# Patient Record
Sex: Female | Born: 1945 | Race: White | Hispanic: No | Smoking: Current every day smoker
Health system: Southern US, Community
[De-identification: ages and names within clinical notes are randomized; demographics above are authoritative.]

## PROBLEM LIST (undated history)

## (undated) VITALS — BP 111/62 | HR 87 | Temp 98.6°F | Resp 19 | Ht 64.0 in | Wt 132.8 lb

## (undated) DIAGNOSIS — J449 Chronic obstructive pulmonary disease, unspecified: Secondary | ICD-10-CM

## (undated) DIAGNOSIS — C50919 Malignant neoplasm of unspecified site of unspecified female breast: Secondary | ICD-10-CM

## (undated) DIAGNOSIS — C7800 Secondary malignant neoplasm of unspecified lung: Secondary | ICD-10-CM

## (undated) DIAGNOSIS — A4902 Methicillin resistant Staphylococcus aureus infection, unspecified site: Secondary | ICD-10-CM

## (undated) DIAGNOSIS — J45909 Unspecified asthma, uncomplicated: Secondary | ICD-10-CM

## (undated) DIAGNOSIS — I10 Essential (primary) hypertension: Secondary | ICD-10-CM

## (undated) HISTORY — DX: Secondary malignant neoplasm of unspecified lung: C78.00

## (undated) HISTORY — DX: Malignant neoplasm of unspecified site of unspecified female breast: C50.919

## (undated) HISTORY — PX: CHOLECYSTECTOMY: SHX55

## (undated) HISTORY — PX: MASTECTOMY: SHX3

---

## 1999-05-22 ENCOUNTER — Emergency Department (HOSPITAL_COMMUNITY): Admission: EM | Admit: 1999-05-22 | Discharge: 1999-05-22 | Payer: Self-pay | Admitting: Emergency Medicine

## 1999-05-30 ENCOUNTER — Emergency Department (HOSPITAL_COMMUNITY): Admission: EM | Admit: 1999-05-30 | Discharge: 1999-05-30 | Payer: Self-pay | Admitting: Emergency Medicine

## 2003-02-01 ENCOUNTER — Emergency Department (HOSPITAL_COMMUNITY): Admission: EM | Admit: 2003-02-01 | Discharge: 2003-02-01 | Payer: Self-pay | Admitting: Emergency Medicine

## 2003-03-26 ENCOUNTER — Emergency Department (HOSPITAL_COMMUNITY): Admission: EM | Admit: 2003-03-26 | Discharge: 2003-03-26 | Payer: Self-pay | Admitting: Emergency Medicine

## 2003-04-08 ENCOUNTER — Other Ambulatory Visit: Payer: Self-pay

## 2004-02-11 ENCOUNTER — Emergency Department: Payer: Self-pay | Admitting: Emergency Medicine

## 2004-07-05 ENCOUNTER — Emergency Department: Payer: Self-pay | Admitting: Emergency Medicine

## 2005-02-27 ENCOUNTER — Emergency Department: Payer: Self-pay | Admitting: Emergency Medicine

## 2005-05-27 ENCOUNTER — Emergency Department: Payer: Self-pay | Admitting: Emergency Medicine

## 2005-07-13 ENCOUNTER — Other Ambulatory Visit: Payer: Self-pay

## 2005-07-13 ENCOUNTER — Emergency Department: Payer: Self-pay | Admitting: Emergency Medicine

## 2005-10-31 ENCOUNTER — Emergency Department: Payer: Self-pay | Admitting: Emergency Medicine

## 2005-11-02 ENCOUNTER — Emergency Department: Payer: Self-pay | Admitting: Emergency Medicine

## 2005-12-02 ENCOUNTER — Inpatient Hospital Stay: Payer: Self-pay | Admitting: Internal Medicine

## 2006-01-28 ENCOUNTER — Emergency Department: Payer: Self-pay | Admitting: Emergency Medicine

## 2006-02-11 ENCOUNTER — Emergency Department: Payer: Self-pay | Admitting: Emergency Medicine

## 2006-06-27 ENCOUNTER — Emergency Department: Payer: Self-pay | Admitting: Emergency Medicine

## 2006-07-11 ENCOUNTER — Emergency Department: Payer: Self-pay | Admitting: Emergency Medicine

## 2006-08-08 ENCOUNTER — Ambulatory Visit: Payer: Self-pay | Admitting: Internal Medicine

## 2006-08-19 ENCOUNTER — Emergency Department: Payer: Self-pay

## 2006-08-19 ENCOUNTER — Other Ambulatory Visit: Payer: Self-pay

## 2006-08-28 ENCOUNTER — Ambulatory Visit: Payer: Self-pay | Admitting: Internal Medicine

## 2006-09-01 ENCOUNTER — Ambulatory Visit: Payer: Self-pay | Admitting: Vascular Surgery

## 2006-09-02 ENCOUNTER — Emergency Department: Payer: Self-pay | Admitting: Emergency Medicine

## 2006-09-07 ENCOUNTER — Ambulatory Visit: Payer: Self-pay | Admitting: Internal Medicine

## 2006-09-09 ENCOUNTER — Inpatient Hospital Stay: Payer: Self-pay | Admitting: Internal Medicine

## 2006-10-05 ENCOUNTER — Emergency Department: Payer: Self-pay | Admitting: Emergency Medicine

## 2006-10-08 ENCOUNTER — Ambulatory Visit: Payer: Self-pay | Admitting: Internal Medicine

## 2006-10-25 ENCOUNTER — Inpatient Hospital Stay: Payer: Self-pay | Admitting: General Practice

## 2006-11-07 ENCOUNTER — Ambulatory Visit: Payer: Self-pay | Admitting: Internal Medicine

## 2006-11-13 ENCOUNTER — Ambulatory Visit: Payer: Self-pay | Admitting: Internal Medicine

## 2006-11-21 ENCOUNTER — Inpatient Hospital Stay: Payer: Self-pay | Admitting: Internal Medicine

## 2006-12-08 ENCOUNTER — Ambulatory Visit: Payer: Self-pay | Admitting: Internal Medicine

## 2006-12-16 ENCOUNTER — Other Ambulatory Visit: Payer: Self-pay

## 2006-12-16 ENCOUNTER — Emergency Department: Payer: Self-pay | Admitting: Emergency Medicine

## 2007-01-08 ENCOUNTER — Ambulatory Visit: Payer: Self-pay | Admitting: Internal Medicine

## 2007-01-08 ENCOUNTER — Inpatient Hospital Stay: Payer: Self-pay | Admitting: Internal Medicine

## 2007-01-08 ENCOUNTER — Other Ambulatory Visit: Payer: Self-pay

## 2007-01-11 ENCOUNTER — Other Ambulatory Visit: Payer: Self-pay

## 2007-01-18 ENCOUNTER — Ambulatory Visit: Payer: Self-pay | Admitting: Internal Medicine

## 2007-02-01 ENCOUNTER — Ambulatory Visit: Payer: Self-pay | Admitting: Internal Medicine

## 2007-02-07 ENCOUNTER — Ambulatory Visit: Payer: Self-pay | Admitting: Internal Medicine

## 2007-02-19 ENCOUNTER — Emergency Department: Payer: Self-pay

## 2007-03-10 ENCOUNTER — Ambulatory Visit: Payer: Self-pay | Admitting: Internal Medicine

## 2007-04-09 ENCOUNTER — Ambulatory Visit: Payer: Self-pay | Admitting: Internal Medicine

## 2007-05-10 ENCOUNTER — Ambulatory Visit: Payer: Self-pay | Admitting: Internal Medicine

## 2007-06-06 ENCOUNTER — Encounter: Payer: Self-pay | Admitting: Internal Medicine

## 2007-06-10 ENCOUNTER — Ambulatory Visit: Payer: Self-pay | Admitting: Internal Medicine

## 2007-07-08 ENCOUNTER — Ambulatory Visit: Payer: Self-pay | Admitting: Internal Medicine

## 2007-08-08 ENCOUNTER — Ambulatory Visit: Payer: Self-pay | Admitting: Internal Medicine

## 2007-08-19 ENCOUNTER — Emergency Department: Payer: Self-pay | Admitting: Emergency Medicine

## 2007-08-23 ENCOUNTER — Ambulatory Visit: Payer: Self-pay | Admitting: Cardiovascular Disease

## 2007-08-23 ENCOUNTER — Other Ambulatory Visit: Payer: Self-pay

## 2007-09-07 ENCOUNTER — Ambulatory Visit: Payer: Self-pay | Admitting: Internal Medicine

## 2007-10-08 ENCOUNTER — Ambulatory Visit: Payer: Self-pay | Admitting: Internal Medicine

## 2007-11-07 ENCOUNTER — Ambulatory Visit: Payer: Self-pay | Admitting: Internal Medicine

## 2007-12-08 ENCOUNTER — Ambulatory Visit: Payer: Self-pay | Admitting: Internal Medicine

## 2008-01-08 ENCOUNTER — Ambulatory Visit: Payer: Self-pay | Admitting: Internal Medicine

## 2008-01-29 ENCOUNTER — Ambulatory Visit: Payer: Self-pay | Admitting: Vascular Surgery

## 2008-02-07 ENCOUNTER — Ambulatory Visit: Payer: Self-pay | Admitting: Internal Medicine

## 2008-04-07 ENCOUNTER — Encounter: Payer: Self-pay | Admitting: Internal Medicine

## 2008-04-07 ENCOUNTER — Ambulatory Visit: Payer: Self-pay | Admitting: Internal Medicine

## 2008-04-08 ENCOUNTER — Encounter: Payer: Self-pay | Admitting: Internal Medicine

## 2008-04-14 ENCOUNTER — Ambulatory Visit: Payer: Self-pay | Admitting: Podiatry

## 2008-05-09 ENCOUNTER — Ambulatory Visit: Payer: Self-pay | Admitting: Internal Medicine

## 2008-05-09 ENCOUNTER — Encounter: Payer: Self-pay | Admitting: Internal Medicine

## 2008-06-06 ENCOUNTER — Ambulatory Visit: Payer: Self-pay | Admitting: Internal Medicine

## 2008-06-09 ENCOUNTER — Ambulatory Visit: Payer: Self-pay | Admitting: Internal Medicine

## 2008-08-04 ENCOUNTER — Ambulatory Visit: Payer: Self-pay | Admitting: Cardiovascular Disease

## 2008-08-19 ENCOUNTER — Ambulatory Visit: Payer: Self-pay | Admitting: Cardiovascular Disease

## 2008-09-17 ENCOUNTER — Inpatient Hospital Stay: Payer: Self-pay | Admitting: Cardiovascular Disease

## 2008-10-13 ENCOUNTER — Other Ambulatory Visit: Payer: Self-pay | Admitting: Cardiovascular Disease

## 2008-10-14 ENCOUNTER — Ambulatory Visit: Payer: Self-pay | Admitting: Cardiovascular Disease

## 2008-11-06 ENCOUNTER — Ambulatory Visit: Payer: Self-pay | Admitting: Internal Medicine

## 2008-11-17 ENCOUNTER — Ambulatory Visit: Payer: Self-pay | Admitting: Internal Medicine

## 2008-12-07 ENCOUNTER — Ambulatory Visit: Payer: Self-pay | Admitting: Internal Medicine

## 2009-06-09 ENCOUNTER — Ambulatory Visit: Payer: Self-pay | Admitting: Internal Medicine

## 2009-06-23 ENCOUNTER — Ambulatory Visit: Payer: Self-pay | Admitting: Internal Medicine

## 2009-07-07 ENCOUNTER — Ambulatory Visit: Payer: Self-pay | Admitting: Internal Medicine

## 2009-10-13 ENCOUNTER — Ambulatory Visit: Payer: Self-pay | Admitting: Internal Medicine

## 2009-11-30 ENCOUNTER — Emergency Department: Payer: Self-pay | Admitting: Emergency Medicine

## 2009-12-27 ENCOUNTER — Emergency Department: Payer: Self-pay | Admitting: Internal Medicine

## 2010-02-22 ENCOUNTER — Emergency Department: Payer: Self-pay | Admitting: Internal Medicine

## 2010-03-10 ENCOUNTER — Emergency Department: Payer: Self-pay | Admitting: Unknown Physician Specialty

## 2010-06-01 ENCOUNTER — Inpatient Hospital Stay: Payer: Self-pay | Admitting: Internal Medicine

## 2010-06-18 ENCOUNTER — Ambulatory Visit: Payer: Self-pay | Admitting: Internal Medicine

## 2010-06-19 LAB — CANCER ANTIGEN 27.29: CA 27.29: 38.8 U/mL — ABNORMAL HIGH (ref 0.0–38.6)

## 2010-06-27 ENCOUNTER — Inpatient Hospital Stay: Payer: Self-pay | Admitting: Internal Medicine

## 2010-07-08 ENCOUNTER — Ambulatory Visit: Payer: Self-pay | Admitting: Internal Medicine

## 2010-09-17 ENCOUNTER — Ambulatory Visit: Payer: Self-pay | Admitting: Internal Medicine

## 2010-09-18 LAB — CANCER ANTIGEN 27.29: CA 27.29: 33.1 U/mL (ref 0.0–38.6)

## 2010-10-08 ENCOUNTER — Ambulatory Visit: Payer: Self-pay | Admitting: Internal Medicine

## 2010-10-26 ENCOUNTER — Encounter: Payer: Self-pay | Admitting: Nurse Practitioner

## 2010-11-20 ENCOUNTER — Emergency Department: Payer: Self-pay | Admitting: Emergency Medicine

## 2010-12-05 ENCOUNTER — Emergency Department: Payer: Self-pay | Admitting: Emergency Medicine

## 2011-01-16 ENCOUNTER — Emergency Department: Payer: Self-pay | Admitting: Emergency Medicine

## 2011-01-29 ENCOUNTER — Emergency Department: Payer: Self-pay | Admitting: Internal Medicine

## 2011-02-06 ENCOUNTER — Emergency Department: Payer: Self-pay | Admitting: *Deleted

## 2011-02-09 ENCOUNTER — Ambulatory Visit: Payer: Self-pay | Admitting: Internal Medicine

## 2011-02-14 ENCOUNTER — Ambulatory Visit: Payer: Self-pay | Admitting: Internal Medicine

## 2011-02-15 LAB — CANCER ANTIGEN 27.29: CA 27.29: 35.5 U/mL (ref 0.0–38.6)

## 2011-02-23 ENCOUNTER — Ambulatory Visit: Payer: Self-pay | Admitting: Internal Medicine

## 2011-03-10 ENCOUNTER — Ambulatory Visit: Payer: Self-pay | Admitting: Internal Medicine

## 2011-03-12 ENCOUNTER — Emergency Department: Payer: Self-pay | Admitting: Internal Medicine

## 2011-03-13 ENCOUNTER — Emergency Department: Payer: Self-pay | Admitting: Emergency Medicine

## 2011-03-15 ENCOUNTER — Ambulatory Visit: Payer: Self-pay | Admitting: Internal Medicine

## 2011-04-19 ENCOUNTER — Emergency Department: Payer: Self-pay | Admitting: Emergency Medicine

## 2011-05-20 ENCOUNTER — Inpatient Hospital Stay: Payer: Self-pay | Admitting: Internal Medicine

## 2011-05-20 LAB — CBC WITH DIFFERENTIAL/PLATELET
Basophil #: 0 10*3/uL (ref 0.0–0.1)
Basophil %: 1 %
Eosinophil #: 0.2 10*3/uL (ref 0.0–0.7)
Eosinophil %: 3.4 %
HCT: 29.5 % — ABNORMAL LOW (ref 35.0–47.0)
HGB: 9.4 g/dL — ABNORMAL LOW (ref 12.0–16.0)
Lymphocyte #: 1.9 10*3/uL (ref 1.0–3.6)
Lymphocyte %: 39.9 %
MCH: 31.8 pg (ref 26.0–34.0)
MCHC: 31.9 g/dL — ABNORMAL LOW (ref 32.0–36.0)
MCV: 100 fL (ref 80–100)
Monocyte #: 0.4 10*3/uL (ref 0.0–0.7)
Monocyte %: 7.6 %
Neutrophil #: 2.3 10*3/uL (ref 1.4–6.5)
Neutrophil %: 48.1 %
Platelet: 188 10*3/uL (ref 150–440)
RBC: 2.96 10*6/uL — ABNORMAL LOW (ref 3.80–5.20)
RDW: 16.5 % — ABNORMAL HIGH (ref 11.5–14.5)
WBC: 4.8 10*3/uL (ref 3.6–11.0)

## 2011-05-20 LAB — PROTIME-INR
INR: 1
Prothrombin Time: 13.2 secs (ref 11.5–14.7)

## 2011-05-20 LAB — BASIC METABOLIC PANEL
Anion Gap: 6 — ABNORMAL LOW (ref 7–16)
BUN: 12 mg/dL (ref 7–18)
Calcium, Total: 8.3 mg/dL — ABNORMAL LOW (ref 8.5–10.1)
Chloride: 108 mmol/L — ABNORMAL HIGH (ref 98–107)
Co2: 32 mmol/L (ref 21–32)
Creatinine: 0.54 mg/dL — ABNORMAL LOW (ref 0.60–1.30)
EGFR (African American): >60
EGFR (Non-African Amer.): >60
Glucose: 74 mg/dL (ref 65–99)
Osmolality: 289 (ref 275–301)
Potassium: 4 mmol/L (ref 3.5–5.1)
Sodium: 146 mmol/L — ABNORMAL HIGH (ref 136–145)

## 2011-05-21 LAB — CBC WITH DIFFERENTIAL/PLATELET
Basophil #: 0 10*3/uL (ref 0.0–0.1)
Basophil %: 0.5 %
Eosinophil #: 0.1 10*3/uL (ref 0.0–0.7)
Eosinophil %: 2.3 %
HCT: 29 % — ABNORMAL LOW (ref 35.0–47.0)
HGB: 9.2 g/dL — ABNORMAL LOW (ref 12.0–16.0)
Lymphocyte #: 1.8 10*3/uL (ref 1.0–3.6)
Lymphocyte %: 34.5 %
MCH: 31.6 pg (ref 26.0–34.0)
MCHC: 31.9 g/dL — ABNORMAL LOW (ref 32.0–36.0)
MCV: 99 fL (ref 80–100)
Monocyte #: 0.3 10*3/uL (ref 0.0–0.7)
Monocyte %: 6.4 %
Neutrophil #: 2.9 10*3/uL (ref 1.4–6.5)
Neutrophil %: 56.3 %
Platelet: 177 10*3/uL (ref 150–440)
RBC: 2.93 10*6/uL — ABNORMAL LOW (ref 3.80–5.20)
RDW: 16.5 % — ABNORMAL HIGH (ref 11.5–14.5)
WBC: 5.1 10*3/uL (ref 3.6–11.0)

## 2011-05-21 LAB — BASIC METABOLIC PANEL
Anion Gap: 8 (ref 7–16)
BUN: 11 mg/dL (ref 7–18)
Calcium, Total: 8.2 mg/dL — ABNORMAL LOW (ref 8.5–10.1)
Chloride: 106 mmol/L (ref 98–107)
Co2: 31 mmol/L (ref 21–32)
Creatinine: 0.66 mg/dL (ref 0.60–1.30)
EGFR (African American): >60
EGFR (Non-African Amer.): >60
Glucose: 101 mg/dL — ABNORMAL HIGH (ref 65–99)
Osmolality: 288 (ref 275–301)
Potassium: 3.6 mmol/L (ref 3.5–5.1)
Sodium: 145 mmol/L (ref 136–145)

## 2011-05-22 LAB — BASIC METABOLIC PANEL
Anion Gap: 9 (ref 7–16)
BUN: 15 mg/dL (ref 7–18)
Calcium, Total: 8.2 mg/dL — ABNORMAL LOW (ref 8.5–10.1)
Chloride: 103 mmol/L (ref 98–107)
Co2: 32 mmol/L (ref 21–32)
Creatinine: 0.6 mg/dL (ref 0.60–1.30)
EGFR (African American): >60
EGFR (Non-African Amer.): >60
Glucose: 92 mg/dL (ref 65–99)
Osmolality: 287 (ref 275–301)
Potassium: 3.4 mmol/L — ABNORMAL LOW (ref 3.5–5.1)
Sodium: 144 mmol/L (ref 136–145)

## 2011-05-22 LAB — CBC WITH DIFFERENTIAL/PLATELET
Basophil #: 0 10*3/uL (ref 0.0–0.1)
Basophil %: 0.5 %
Eosinophil #: 0.1 10*3/uL (ref 0.0–0.7)
Eosinophil %: 2 %
HCT: 26.6 % — ABNORMAL LOW (ref 35.0–47.0)
HGB: 8.5 g/dL — ABNORMAL LOW (ref 12.0–16.0)
Lymphocyte #: 1.9 10*3/uL (ref 1.0–3.6)
Lymphocyte %: 39.6 %
MCH: 31.6 pg (ref 26.0–34.0)
MCHC: 32.1 g/dL (ref 32.0–36.0)
MCV: 99 fL (ref 80–100)
Monocyte #: 0.5 10*3/uL (ref 0.0–0.7)
Monocyte %: 9.5 %
Neutrophil #: 2.3 10*3/uL (ref 1.4–6.5)
Neutrophil %: 48.4 %
Platelet: 167 10*3/uL (ref 150–440)
RBC: 2.7 10*6/uL — ABNORMAL LOW (ref 3.80–5.20)
RDW: 16.8 % — ABNORMAL HIGH (ref 11.5–14.5)
WBC: 4.8 10*3/uL (ref 3.6–11.0)

## 2011-05-22 LAB — MAGNESIUM: Magnesium: 1.7 mg/dL — ABNORMAL LOW

## 2011-05-23 LAB — BASIC METABOLIC PANEL
Anion Gap: 7 (ref 7–16)
BUN: 16 mg/dL (ref 7–18)
Calcium, Total: 8.7 mg/dL (ref 8.5–10.1)
Chloride: 101 mmol/L (ref 98–107)
Co2: 32 mmol/L (ref 21–32)
Creatinine: 0.71 mg/dL (ref 0.60–1.30)
EGFR (African American): >60
EGFR (Non-African Amer.): >60
Glucose: 116 mg/dL — ABNORMAL HIGH (ref 65–99)
Osmolality: 282 (ref 275–301)
Potassium: 3.4 mmol/L — ABNORMAL LOW (ref 3.5–5.1)
Sodium: 140 mmol/L (ref 136–145)

## 2011-05-23 LAB — CBC WITH DIFFERENTIAL/PLATELET
Basophil #: 0 10*3/uL (ref 0.0–0.1)
Basophil %: 0.9 %
Eosinophil #: 0.2 10*3/uL (ref 0.0–0.7)
Eosinophil %: 3.9 %
HCT: 27.4 % — ABNORMAL LOW (ref 35.0–47.0)
HGB: 8.5 g/dL — ABNORMAL LOW (ref 12.0–16.0)
Lymphocyte #: 1.9 10*3/uL (ref 1.0–3.6)
Lymphocyte %: 37.8 %
MCH: 30.9 pg (ref 26.0–34.0)
MCHC: 30.9 g/dL — ABNORMAL LOW (ref 32.0–36.0)
MCV: 100 fL (ref 80–100)
Monocyte #: 0.5 10*3/uL (ref 0.0–0.7)
Monocyte %: 9 %
Neutrophil #: 2.4 10*3/uL (ref 1.4–6.5)
Neutrophil %: 48.4 %
Platelet: 157 10*3/uL (ref 150–440)
RBC: 2.74 10*6/uL — ABNORMAL LOW (ref 3.80–5.20)
RDW: 16.1 % — ABNORMAL HIGH (ref 11.5–14.5)
WBC: 5.1 10*3/uL (ref 3.6–11.0)

## 2011-06-17 ENCOUNTER — Emergency Department: Payer: Self-pay | Admitting: Internal Medicine

## 2011-08-04 ENCOUNTER — Encounter: Payer: Self-pay | Admitting: Nurse Practitioner

## 2011-08-04 ENCOUNTER — Encounter: Payer: Self-pay | Admitting: Cardiothoracic Surgery

## 2011-08-08 ENCOUNTER — Encounter: Payer: Self-pay | Admitting: Nurse Practitioner

## 2011-08-17 ENCOUNTER — Ambulatory Visit: Payer: Self-pay | Admitting: Internal Medicine

## 2011-08-17 LAB — CBC CANCER CENTER
Basophil #: 0 x10 3/mm (ref 0.0–0.1)
Basophil %: 0.4 %
Eosinophil #: 0.2 x10 3/mm (ref 0.0–0.7)
Eosinophil %: 3.4 %
HCT: 33.2 % — ABNORMAL LOW (ref 35.0–47.0)
HGB: 10.6 g/dL — ABNORMAL LOW (ref 12.0–16.0)
Lymphocyte #: 2.4 x10 3/mm (ref 1.0–3.6)
Lymphocyte %: 35.3 %
MCH: 29.6 pg (ref 26.0–34.0)
MCHC: 31.8 g/dL — ABNORMAL LOW (ref 32.0–36.0)
MCV: 93 fL (ref 80–100)
Monocyte #: 0.5 x10 3/mm (ref 0.2–0.9)
Monocyte %: 6.8 %
Neutrophil #: 3.8 x10 3/mm (ref 1.4–6.5)
Neutrophil %: 54.1 %
Platelet: 207 x10 3/mm (ref 150–440)
RBC: 3.57 10*6/uL — ABNORMAL LOW (ref 3.80–5.20)
RDW: 18 % — ABNORMAL HIGH (ref 11.5–14.5)
WBC: 6.9 x10 3/mm (ref 3.6–11.0)

## 2011-08-17 LAB — HEPATIC FUNCTION PANEL A (ARMC)
Albumin: 3 g/dL — ABNORMAL LOW (ref 3.4–5.0)
Alkaline Phosphatase: 160 U/L — ABNORMAL HIGH (ref 50–136)
Bilirubin, Direct: 0.1 mg/dL (ref 0.00–0.20)
Bilirubin,Total: 0.2 mg/dL (ref 0.2–1.0)
SGOT(AST): 16 U/L (ref 15–37)
SGPT (ALT): 15 U/L
Total Protein: 7 g/dL (ref 6.4–8.2)

## 2011-08-17 LAB — CREATININE, SERUM
Creatinine: 0.75 mg/dL (ref 0.60–1.30)
EGFR (African American): >60
EGFR (Non-African Amer.): >60

## 2011-08-18 LAB — CANCER ANTIGEN 27.29: CA 27.29: 46.4 U/mL — ABNORMAL HIGH (ref 0.0–38.6)

## 2011-08-26 ENCOUNTER — Encounter: Payer: Self-pay | Admitting: Cardiothoracic Surgery

## 2011-09-07 ENCOUNTER — Ambulatory Visit: Payer: Self-pay | Admitting: Internal Medicine

## 2011-09-07 ENCOUNTER — Encounter: Payer: Self-pay | Admitting: Cardiothoracic Surgery

## 2011-09-07 ENCOUNTER — Encounter: Payer: Self-pay | Admitting: Nurse Practitioner

## 2011-09-13 LAB — WOUND CULTURE

## 2011-10-08 ENCOUNTER — Encounter: Payer: Self-pay | Admitting: Nurse Practitioner

## 2011-10-14 ENCOUNTER — Ambulatory Visit: Payer: Self-pay | Admitting: Internal Medicine

## 2011-10-14 LAB — CREATININE, SERUM
Creatinine: 0.79 mg/dL (ref 0.60–1.30)
EGFR (African American): >60
EGFR (Non-African Amer.): >60

## 2011-10-16 LAB — CANCER ANTIGEN 27.29: CA 27.29: 53.1 U/mL — ABNORMAL HIGH (ref 0.0–38.6)

## 2011-10-20 ENCOUNTER — Emergency Department: Payer: Self-pay | Admitting: Unknown Physician Specialty

## 2011-10-20 ENCOUNTER — Encounter: Payer: Self-pay | Admitting: Cardiothoracic Surgery

## 2011-11-07 ENCOUNTER — Encounter: Payer: Self-pay | Admitting: Cardiothoracic Surgery

## 2011-11-07 ENCOUNTER — Encounter: Payer: Self-pay | Admitting: Nurse Practitioner

## 2011-11-07 ENCOUNTER — Ambulatory Visit: Payer: Self-pay | Admitting: Internal Medicine

## 2011-11-08 ENCOUNTER — Encounter: Payer: Self-pay | Admitting: Cardiothoracic Surgery

## 2011-11-15 ENCOUNTER — Ambulatory Visit: Payer: Self-pay | Admitting: Internal Medicine

## 2011-11-19 ENCOUNTER — Other Ambulatory Visit: Payer: Self-pay | Admitting: Internal Medicine

## 2011-11-19 LAB — CBC WITH DIFFERENTIAL/PLATELET
Basophil #: 0 10*3/uL (ref 0.0–0.1)
Basophil %: 0.8 %
Eosinophil #: 0.2 10*3/uL (ref 0.0–0.7)
Eosinophil %: 3 %
HCT: 35.9 % (ref 35.0–47.0)
HGB: 11.7 g/dL — ABNORMAL LOW (ref 12.0–16.0)
Lymphocyte #: 2.4 10*3/uL (ref 1.0–3.6)
Lymphocyte %: 37.4 %
MCH: 30.3 pg (ref 26.0–34.0)
MCHC: 32.5 g/dL (ref 32.0–36.0)
MCV: 94 fL (ref 80–100)
Monocyte #: 0.5 x10 3/mm (ref 0.2–0.9)
Monocyte %: 8.3 %
Neutrophil #: 3.3 10*3/uL (ref 1.4–6.5)
Neutrophil %: 50.5 %
Platelet: 214 10*3/uL (ref 150–440)
RBC: 3.84 10*6/uL (ref 3.80–5.20)
RDW: 15.1 % — ABNORMAL HIGH (ref 11.5–14.5)
WBC: 6.5 10*3/uL (ref 3.6–11.0)

## 2011-12-08 ENCOUNTER — Ambulatory Visit: Payer: Self-pay | Admitting: Internal Medicine

## 2011-12-08 ENCOUNTER — Encounter: Payer: Self-pay | Admitting: Nurse Practitioner

## 2011-12-09 ENCOUNTER — Encounter: Payer: Self-pay | Admitting: Cardiothoracic Surgery

## 2011-12-23 ENCOUNTER — Emergency Department: Payer: Self-pay | Admitting: Emergency Medicine

## 2011-12-24 LAB — CBC WITH DIFFERENTIAL/PLATELET
Basophil #: 0.1 10*3/uL (ref 0.0–0.1)
Basophil %: 0.7 %
Eosinophil #: 0.3 10*3/uL (ref 0.0–0.7)
Eosinophil %: 3.1 %
HCT: 35.8 % (ref 35.0–47.0)
HGB: 11.5 g/dL — ABNORMAL LOW (ref 12.0–16.0)
Lymphocyte #: 2.3 10*3/uL (ref 1.0–3.6)
Lymphocyte %: 25.9 %
MCH: 29.6 pg (ref 26.0–34.0)
MCHC: 32.1 g/dL (ref 32.0–36.0)
MCV: 93 fL (ref 80–100)
Monocyte #: 0.6 x10 3/mm (ref 0.2–0.9)
Monocyte %: 6.4 %
Neutrophil #: 5.6 10*3/uL (ref 1.4–6.5)
Neutrophil %: 63.9 %
Platelet: 210 10*3/uL (ref 150–440)
RBC: 3.88 10*6/uL (ref 3.80–5.20)
RDW: 15.5 % — ABNORMAL HIGH (ref 11.5–14.5)
WBC: 8.7 10*3/uL (ref 3.6–11.0)

## 2011-12-24 LAB — BASIC METABOLIC PANEL
Anion Gap: 6 — ABNORMAL LOW (ref 7–16)
BUN: 25 mg/dL — ABNORMAL HIGH (ref 7–18)
Calcium, Total: 9 mg/dL (ref 8.5–10.1)
Chloride: 105 mmol/L (ref 98–107)
Co2: 29 mmol/L (ref 21–32)
Creatinine: 0.66 mg/dL (ref 0.60–1.30)
EGFR (African American): >60
EGFR (Non-African Amer.): >60
Glucose: 94 mg/dL (ref 65–99)
Osmolality: 284 (ref 275–301)
Potassium: 4.5 mmol/L (ref 3.5–5.1)
Sodium: 140 mmol/L (ref 136–145)

## 2011-12-29 LAB — CULTURE, BLOOD (SINGLE)

## 2012-01-08 ENCOUNTER — Encounter: Payer: Self-pay | Admitting: Cardiothoracic Surgery

## 2012-01-08 ENCOUNTER — Ambulatory Visit: Payer: Self-pay | Admitting: Internal Medicine

## 2012-01-08 ENCOUNTER — Encounter: Payer: Self-pay | Admitting: Nurse Practitioner

## 2012-02-07 ENCOUNTER — Encounter: Payer: Self-pay | Admitting: Nurse Practitioner

## 2012-02-07 ENCOUNTER — Encounter: Payer: Self-pay | Admitting: Cardiothoracic Surgery

## 2012-03-09 ENCOUNTER — Encounter: Payer: Self-pay | Admitting: Nurse Practitioner

## 2012-03-09 ENCOUNTER — Encounter: Payer: Self-pay | Admitting: Cardiothoracic Surgery

## 2012-04-08 ENCOUNTER — Encounter: Payer: Self-pay | Admitting: Cardiothoracic Surgery

## 2012-04-08 ENCOUNTER — Encounter: Payer: Self-pay | Admitting: Nurse Practitioner

## 2012-05-07 ENCOUNTER — Ambulatory Visit: Payer: Self-pay | Admitting: Internal Medicine

## 2012-05-07 LAB — HEPATIC FUNCTION PANEL A (ARMC)
Albumin: 2.9 g/dL — ABNORMAL LOW (ref 3.4–5.0)
Alkaline Phosphatase: 134 U/L (ref 50–136)
Bilirubin, Direct: 0.1 mg/dL (ref 0.00–0.20)
Bilirubin,Total: 0.4 mg/dL (ref 0.2–1.0)
SGOT(AST): 23 U/L (ref 15–37)
SGPT (ALT): 16 U/L (ref 12–78)
Total Protein: 6.9 g/dL (ref 6.4–8.2)

## 2012-05-07 LAB — CBC CANCER CENTER
Basophil #: 0 x10 3/mm (ref 0.0–0.1)
Basophil %: 0.7 %
Eosinophil #: 0.3 x10 3/mm (ref 0.0–0.7)
Eosinophil %: 4.1 %
HCT: 35.3 % (ref 35.0–47.0)
HGB: 11.5 g/dL — ABNORMAL LOW (ref 12.0–16.0)
Lymphocyte #: 2.2 x10 3/mm (ref 1.0–3.6)
Lymphocyte %: 34.3 %
MCH: 28.3 pg (ref 26.0–34.0)
MCHC: 32.5 g/dL (ref 32.0–36.0)
MCV: 87 fL (ref 80–100)
Monocyte #: 0.6 x10 3/mm (ref 0.2–0.9)
Monocyte %: 9.6 %
Neutrophil #: 3.3 x10 3/mm (ref 1.4–6.5)
Neutrophil %: 51.3 %
Platelet: 199 x10 3/mm (ref 150–440)
RBC: 4.06 10*6/uL (ref 3.80–5.20)
RDW: 17.4 % — ABNORMAL HIGH (ref 11.5–14.5)
WBC: 6.5 x10 3/mm (ref 3.6–11.0)

## 2012-05-07 LAB — CREATININE, SERUM
Creatinine: 0.72 mg/dL (ref 0.60–1.30)
EGFR (African American): >60
EGFR (Non-African Amer.): >60

## 2012-05-08 LAB — CANCER ANTIGEN 27.29: CA 27.29: 97.6 U/mL — ABNORMAL HIGH (ref 0.0–38.6)

## 2012-05-09 ENCOUNTER — Encounter: Payer: Self-pay | Admitting: Nurse Practitioner

## 2012-05-09 ENCOUNTER — Ambulatory Visit: Payer: Self-pay | Admitting: Internal Medicine

## 2012-05-22 ENCOUNTER — Ambulatory Visit: Payer: Self-pay | Admitting: Internal Medicine

## 2012-05-22 ENCOUNTER — Encounter: Payer: Self-pay | Admitting: Cardiothoracic Surgery

## 2012-06-04 ENCOUNTER — Ambulatory Visit: Payer: Self-pay | Admitting: Surgery

## 2012-06-04 DIAGNOSIS — J449 Chronic obstructive pulmonary disease, unspecified: Secondary | ICD-10-CM

## 2012-06-04 LAB — CBC WITH DIFFERENTIAL/PLATELET
Basophil %: 0.9 %
Eosinophil %: 2.4 %
HCT: 38.6 % (ref 35.0–47.0)
HGB: 12 g/dL (ref 12.0–16.0)
Lymphocyte #: 3.4 10*3/uL (ref 1.0–3.6)
MCH: 27.1 pg (ref 26.0–34.0)
MCV: 87 fL (ref 80–100)
Monocyte #: 0.5 x10 3/mm (ref 0.2–0.9)
Neutrophil %: 49.3 %
WBC: 8.1 10*3/uL (ref 3.6–11.0)

## 2012-06-04 LAB — BASIC METABOLIC PANEL
BUN: 16 mg/dL (ref 7–18)
Chloride: 104 mmol/L (ref 98–107)
Co2: 35 mmol/L — ABNORMAL HIGH (ref 21–32)
EGFR (Non-African Amer.): >60
Osmolality: 284 (ref 275–301)
Sodium: 142 mmol/L (ref 136–145)

## 2012-06-07 ENCOUNTER — Inpatient Hospital Stay: Payer: Self-pay | Admitting: Surgery

## 2012-06-09 ENCOUNTER — Ambulatory Visit: Payer: Self-pay | Admitting: Internal Medicine

## 2012-06-09 ENCOUNTER — Encounter: Payer: Self-pay | Admitting: Cardiothoracic Surgery

## 2012-06-09 ENCOUNTER — Encounter: Payer: Self-pay | Admitting: Nurse Practitioner

## 2012-06-09 LAB — PLATELET COUNT: Platelet: 201 10*3/uL (ref 150–440)

## 2012-06-12 LAB — PATHOLOGY REPORT

## 2012-06-13 LAB — PLATELET COUNT: Platelet: 175 10*3/uL (ref 150–440)

## 2012-06-14 ENCOUNTER — Ambulatory Visit: Payer: Self-pay | Admitting: Internal Medicine

## 2012-07-07 ENCOUNTER — Encounter: Payer: Self-pay | Admitting: Nurse Practitioner

## 2012-07-07 ENCOUNTER — Ambulatory Visit: Payer: Self-pay | Admitting: Internal Medicine

## 2012-07-13 ENCOUNTER — Emergency Department: Payer: Self-pay | Admitting: Emergency Medicine

## 2012-07-17 ENCOUNTER — Ambulatory Visit: Payer: Self-pay | Admitting: Internal Medicine

## 2012-07-17 LAB — CBC CANCER CENTER
Basophil #: 0.1 x10 3/mm (ref 0.0–0.1)
Basophil %: 0.8 %
Eosinophil #: 0.2 x10 3/mm (ref 0.0–0.7)
Eosinophil %: 2.7 %
HCT: 36.7 % (ref 35.0–47.0)
HGB: 11.6 g/dL — ABNORMAL LOW (ref 12.0–16.0)
Lymphocyte #: 2.1 x10 3/mm (ref 1.0–3.6)
Lymphocyte %: 32.5 %
MCH: 26.6 pg (ref 26.0–34.0)
MCHC: 31.7 g/dL — ABNORMAL LOW (ref 32.0–36.0)
MCV: 84 fL (ref 80–100)
Monocyte #: 0.4 x10 3/mm (ref 0.2–0.9)
Neutrophil %: 58.4 %
RBC: 4.36 10*6/uL (ref 3.80–5.20)
RDW: 16.2 % — ABNORMAL HIGH (ref 11.5–14.5)

## 2012-07-17 LAB — HEPATIC FUNCTION PANEL A (ARMC)
SGOT(AST): 18 U/L (ref 15–37)
SGPT (ALT): 17 U/L (ref 12–78)

## 2012-07-17 LAB — CREATININE, SERUM
Creatinine: 0.74 mg/dL (ref 0.60–1.30)
EGFR (African American): >60

## 2012-07-18 ENCOUNTER — Encounter: Payer: Self-pay | Admitting: Cardiothoracic Surgery

## 2012-07-18 LAB — CANCER ANTIGEN 27.29: CA 27.29: 114.6 U/mL — ABNORMAL HIGH (ref 0.0–38.6)

## 2012-07-24 ENCOUNTER — Emergency Department: Payer: Self-pay | Admitting: Emergency Medicine

## 2012-07-24 LAB — URINALYSIS, COMPLETE
Bacteria: NONE SEEN
Bilirubin,UR: NEGATIVE
Blood: NEGATIVE
Ketone: NEGATIVE
Leukocyte Esterase: NEGATIVE
Ph: 5 (ref 4.5–8.0)
Protein: NEGATIVE
Specific Gravity: 1.013 (ref 1.003–1.030)
Squamous Epithelial: NONE SEEN
WBC UR: <1 /HPF (ref 0–5)

## 2012-07-24 LAB — COMPREHENSIVE METABOLIC PANEL
Albumin: 2.9 g/dL — ABNORMAL LOW (ref 3.4–5.0)
Bilirubin,Total: 0.3 mg/dL (ref 0.2–1.0)
Calcium, Total: 8.1 mg/dL — ABNORMAL LOW (ref 8.5–10.1)
Chloride: 107 mmol/L (ref 98–107)
Creatinine: 0.65 mg/dL (ref 0.60–1.30)
EGFR (Non-African Amer.): >60
Glucose: 92 mg/dL (ref 65–99)
Osmolality: 278 (ref 275–301)
Potassium: 3.4 mmol/L — ABNORMAL LOW (ref 3.5–5.1)
SGOT(AST): 14 U/L — ABNORMAL LOW (ref 15–37)
SGPT (ALT): 12 U/L (ref 12–78)
Sodium: 139 mmol/L (ref 136–145)

## 2012-07-24 LAB — CBC
HCT: 37.2 % (ref 35.0–47.0)
MCHC: 31 g/dL — ABNORMAL LOW (ref 32.0–36.0)
MCV: 84 fL (ref 80–100)
RBC: 4.43 10*6/uL (ref 3.80–5.20)
WBC: 4.9 10*3/uL (ref 3.6–11.0)

## 2012-07-24 LAB — TROPONIN I: Troponin-I: <0.02 ng/mL

## 2012-07-25 LAB — DRUG SCREEN, URINE
Amphetamines, Ur Screen: NEGATIVE (ref ?–1000)
Barbiturates, Ur Screen: NEGATIVE (ref ?–200)
Benzodiazepine, Ur Scrn: NEGATIVE (ref ?–200)
Methadone, Ur Screen: POSITIVE (ref ?–300)
Opiate, Ur Screen: NEGATIVE (ref ?–300)
Phencyclidine (PCP) Ur S: NEGATIVE (ref ?–25)
Tricyclic, Ur Screen: POSITIVE (ref ?–1000)

## 2012-07-30 ENCOUNTER — Other Ambulatory Visit: Payer: Self-pay | Admitting: Podiatry

## 2012-08-03 LAB — WOUND CULTURE

## 2012-08-07 ENCOUNTER — Encounter: Payer: Self-pay | Admitting: Cardiothoracic Surgery

## 2012-08-07 ENCOUNTER — Ambulatory Visit: Payer: Self-pay | Admitting: Internal Medicine

## 2012-08-07 ENCOUNTER — Encounter: Payer: Self-pay | Admitting: Nurse Practitioner

## 2012-08-14 LAB — CBC CANCER CENTER
Basophil %: 1 %
Eosinophil #: 0.1 x10 3/mm (ref 0.0–0.7)
HCT: 37.1 % (ref 35.0–47.0)
HGB: 11.7 g/dL — ABNORMAL LOW (ref 12.0–16.0)
MCH: 26.3 pg (ref 26.0–34.0)
MCHC: 31.5 g/dL — ABNORMAL LOW (ref 32.0–36.0)
Monocyte %: 5.6 %
Neutrophil #: 1.8 x10 3/mm (ref 1.4–6.5)
Platelet: 221 x10 3/mm (ref 150–440)
RBC: 4.44 10*6/uL (ref 3.80–5.20)
RDW: 17.3 % — ABNORMAL HIGH (ref 11.5–14.5)

## 2012-08-14 LAB — HEPATIC FUNCTION PANEL A (ARMC)
Alkaline Phosphatase: 137 U/L — ABNORMAL HIGH (ref 50–136)
SGOT(AST): 13 U/L — ABNORMAL LOW (ref 15–37)
SGPT (ALT): 13 U/L (ref 12–78)

## 2012-08-14 LAB — CREATININE, SERUM: EGFR (Non-African Amer.): >60

## 2012-08-18 ENCOUNTER — Emergency Department: Payer: Self-pay | Admitting: Emergency Medicine

## 2012-08-18 LAB — CBC
HCT: 35.1 % (ref 35.0–47.0)
HGB: 10.9 g/dL — ABNORMAL LOW (ref 12.0–16.0)
MCHC: 31 g/dL — ABNORMAL LOW (ref 32.0–36.0)
Platelet: 229 10*3/uL (ref 150–440)
RBC: 4.17 10*6/uL (ref 3.80–5.20)

## 2012-08-18 LAB — COMPREHENSIVE METABOLIC PANEL
Alkaline Phosphatase: 123 U/L (ref 50–136)
Anion Gap: 4 — ABNORMAL LOW (ref 7–16)
Bilirubin,Total: 0.2 mg/dL (ref 0.2–1.0)
Co2: 29 mmol/L (ref 21–32)
Creatinine: 0.64 mg/dL (ref 0.60–1.30)
EGFR (Non-African Amer.): >60
Osmolality: 276 (ref 275–301)
Potassium: 3.7 mmol/L (ref 3.5–5.1)
Sodium: 138 mmol/L (ref 136–145)

## 2012-08-19 LAB — TROPONIN I: Troponin-I: <0.02 ng/mL

## 2012-08-19 LAB — URINALYSIS, COMPLETE
Bacteria: NONE SEEN
Bilirubin,UR: NEGATIVE
Leukocyte Esterase: NEGATIVE
Nitrite: NEGATIVE
Protein: NEGATIVE
RBC,UR: <1 /HPF (ref 0–5)
Squamous Epithelial: NONE SEEN
WBC UR: <1 /HPF (ref 0–5)

## 2012-08-19 LAB — TSH: Thyroid Stimulating Horm: 2.06 u[IU]/mL

## 2012-09-06 ENCOUNTER — Ambulatory Visit: Payer: Self-pay | Admitting: Internal Medicine

## 2012-09-14 ENCOUNTER — Ambulatory Visit: Payer: Self-pay | Admitting: Internal Medicine

## 2012-09-28 ENCOUNTER — Emergency Department: Payer: Self-pay | Admitting: Emergency Medicine

## 2012-10-07 ENCOUNTER — Ambulatory Visit: Payer: Self-pay | Admitting: Internal Medicine

## 2012-11-06 ENCOUNTER — Ambulatory Visit: Payer: Self-pay | Admitting: Internal Medicine

## 2012-11-06 LAB — HEPATIC FUNCTION PANEL A (ARMC)
Albumin: 3.4 g/dL (ref 3.4–5.0)
Alkaline Phosphatase: 136 U/L (ref 50–136)
Bilirubin, Direct: 0.2 mg/dL (ref 0.00–0.20)
Bilirubin,Total: 0.5 mg/dL (ref 0.2–1.0)
SGOT(AST): 15 U/L (ref 15–37)
SGPT (ALT): 13 U/L (ref 12–78)
Total Protein: 7.2 g/dL (ref 6.4–8.2)

## 2012-11-06 LAB — CBC CANCER CENTER
Basophil #: 0.1 x10 3/mm (ref 0.0–0.1)
Basophil %: 1.4 %
Eosinophil #: 0.3 x10 3/mm (ref 0.0–0.7)
Eosinophil %: 6.2 %
HCT: 35.2 % (ref 35.0–47.0)
HGB: 11.5 g/dL — ABNORMAL LOW (ref 12.0–16.0)
Lymphocyte #: 2.1 x10 3/mm (ref 1.0–3.6)
Lymphocyte %: 47.3 %
MCH: 28.7 pg (ref 26.0–34.0)
MCHC: 32.6 g/dL (ref 32.0–36.0)
MCV: 88 fL (ref 80–100)
Monocyte #: 0.4 x10 3/mm (ref 0.2–0.9)
Monocyte %: 9.4 %
Neutrophil #: 1.6 x10 3/mm (ref 1.4–6.5)
Neutrophil %: 35.7 %
Platelet: 237 x10 3/mm (ref 150–440)
RBC: 4.01 10*6/uL (ref 3.80–5.20)
RDW: 20.6 % — ABNORMAL HIGH (ref 11.5–14.5)
WBC: 4.5 x10 3/mm (ref 3.6–11.0)

## 2012-11-06 LAB — BASIC METABOLIC PANEL
Anion Gap: 3 — ABNORMAL LOW (ref 7–16)
BUN: 18 mg/dL (ref 7–18)
Calcium, Total: 8.7 mg/dL (ref 8.5–10.1)
Chloride: 106 mmol/L (ref 98–107)
Co2: 31 mmol/L (ref 21–32)
Creatinine: 0.9 mg/dL (ref 0.60–1.30)
EGFR (African American): >60
EGFR (Non-African Amer.): >60
Glucose: 81 mg/dL (ref 65–99)
Osmolality: 280 (ref 275–301)
Potassium: 3.7 mmol/L (ref 3.5–5.1)
Sodium: 140 mmol/L (ref 136–145)

## 2012-11-07 LAB — CANCER ANTIGEN 27.29: CA 27.29: 88 U/mL — ABNORMAL HIGH (ref 0.0–38.6)

## 2012-12-07 ENCOUNTER — Ambulatory Visit: Payer: Self-pay | Admitting: Internal Medicine

## 2013-01-01 ENCOUNTER — Emergency Department: Payer: Self-pay | Admitting: Emergency Medicine

## 2013-01-01 LAB — CBC
HCT: 31.6 % — ABNORMAL LOW (ref 35.0–47.0)
HGB: 10.3 g/dL — ABNORMAL LOW (ref 12.0–16.0)
MCH: 29.5 pg (ref 26.0–34.0)
MCHC: 32.7 g/dL (ref 32.0–36.0)
MCV: 90 fL (ref 80–100)
Platelet: 208 10*3/uL (ref 150–440)
RBC: 3.5 10*6/uL — ABNORMAL LOW (ref 3.80–5.20)
RDW: 16.7 % — ABNORMAL HIGH (ref 11.5–14.5)
WBC: 5.3 10*3/uL (ref 3.6–11.0)

## 2013-01-01 LAB — COMPREHENSIVE METABOLIC PANEL
Albumin: 2.9 g/dL — ABNORMAL LOW (ref 3.4–5.0)
Alkaline Phosphatase: 146 U/L — ABNORMAL HIGH (ref 50–136)
Anion Gap: 1 — ABNORMAL LOW (ref 7–16)
BUN: 10 mg/dL (ref 7–18)
Bilirubin,Total: 0.5 mg/dL (ref 0.2–1.0)
Calcium, Total: 8.5 mg/dL (ref 8.5–10.1)
Chloride: 104 mmol/L (ref 98–107)
Co2: 32 mmol/L (ref 21–32)
Creatinine: 0.68 mg/dL (ref 0.60–1.30)
EGFR (African American): >60
EGFR (Non-African Amer.): >60
Glucose: 74 mg/dL (ref 65–99)
Osmolality: 272 (ref 275–301)
Potassium: 3.8 mmol/L (ref 3.5–5.1)
SGOT(AST): 16 U/L (ref 15–37)
SGPT (ALT): 13 U/L (ref 12–78)
Sodium: 137 mmol/L (ref 136–145)
Total Protein: 6.6 g/dL (ref 6.4–8.2)

## 2013-01-11 ENCOUNTER — Ambulatory Visit: Payer: Self-pay | Admitting: Internal Medicine

## 2013-02-06 ENCOUNTER — Ambulatory Visit: Payer: Self-pay | Admitting: Internal Medicine

## 2013-02-13 ENCOUNTER — Ambulatory Visit: Payer: Self-pay | Admitting: Internal Medicine

## 2013-02-13 LAB — CBC CANCER CENTER
Basophil #: 0 x10 3/mm (ref 0.0–0.1)
Basophil %: 0.7 %
Eosinophil #: 0.1 x10 3/mm (ref 0.0–0.7)
Eosinophil %: 2 %
HCT: 34.1 % — ABNORMAL LOW (ref 35.0–47.0)
HGB: 11 g/dL — ABNORMAL LOW (ref 12.0–16.0)
Lymphocyte #: 2 x10 3/mm (ref 1.0–3.6)
Lymphocyte %: 39.3 %
MCH: 29.2 pg (ref 26.0–34.0)
MCHC: 32.1 g/dL (ref 32.0–36.0)
MCV: 91 fL (ref 80–100)
Monocyte #: 0.4 x10 3/mm (ref 0.2–0.9)
Monocyte %: 7 %
Neutrophil #: 2.6 x10 3/mm (ref 1.4–6.5)
Neutrophil %: 51 %
Platelet: 202 x10 3/mm (ref 150–440)
RBC: 3.75 10*6/uL — ABNORMAL LOW (ref 3.80–5.20)
RDW: 18.2 % — ABNORMAL HIGH (ref 11.5–14.5)
WBC: 5.1 x10 3/mm (ref 3.6–11.0)

## 2013-02-13 LAB — HEPATIC FUNCTION PANEL A (ARMC)
Albumin: 2.9 g/dL — ABNORMAL LOW (ref 3.4–5.0)
Alkaline Phosphatase: 156 U/L — ABNORMAL HIGH (ref 50–136)
Bilirubin, Direct: 0.1 mg/dL (ref 0.00–0.20)
Bilirubin,Total: 0.3 mg/dL (ref 0.2–1.0)
SGOT(AST): 15 U/L (ref 15–37)
SGPT (ALT): 12 U/L (ref 12–78)
Total Protein: 6.6 g/dL (ref 6.4–8.2)

## 2013-02-13 LAB — BASIC METABOLIC PANEL
Anion Gap: 4 — ABNORMAL LOW (ref 7–16)
BUN: 11 mg/dL (ref 7–18)
Calcium, Total: 8 mg/dL — ABNORMAL LOW (ref 8.5–10.1)
Chloride: 103 mmol/L (ref 98–107)
Co2: 34 mmol/L — ABNORMAL HIGH (ref 21–32)
Creatinine: 0.7 mg/dL (ref 0.60–1.30)
EGFR (African American): >60
EGFR (Non-African Amer.): >60
Glucose: 94 mg/dL (ref 65–99)
Osmolality: 280 (ref 275–301)
Potassium: 3.4 mmol/L — ABNORMAL LOW (ref 3.5–5.1)
Sodium: 141 mmol/L (ref 136–145)

## 2013-02-14 LAB — CANCER ANTIGEN 27.29: CA 27.29: 92.2 U/mL — ABNORMAL HIGH (ref 0.0–38.6)

## 2013-03-04 ENCOUNTER — Inpatient Hospital Stay: Payer: Self-pay | Admitting: Internal Medicine

## 2013-03-04 LAB — COMPREHENSIVE METABOLIC PANEL
Albumin: 2.9 g/dL — ABNORMAL LOW (ref 3.4–5.0)
Alkaline Phosphatase: 126 U/L (ref 50–136)
Anion Gap: 3 — ABNORMAL LOW (ref 7–16)
BUN: 10 mg/dL (ref 7–18)
Bilirubin,Total: 0.3 mg/dL (ref 0.2–1.0)
Calcium, Total: 8.8 mg/dL (ref 8.5–10.1)
Chloride: 105 mmol/L (ref 98–107)
Co2: 30 mmol/L (ref 21–32)
Creatinine: 0.74 mg/dL (ref 0.60–1.30)
EGFR (African American): >60
EGFR (Non-African Amer.): >60
Glucose: 95 mg/dL (ref 65–99)
Osmolality: 275 (ref 275–301)
Potassium: 3.6 mmol/L (ref 3.5–5.1)
SGOT(AST): 19 U/L (ref 15–37)
SGPT (ALT): 11 U/L — ABNORMAL LOW (ref 12–78)
Sodium: 138 mmol/L (ref 136–145)
Total Protein: 6.7 g/dL (ref 6.4–8.2)

## 2013-03-04 LAB — URINALYSIS, COMPLETE
Bacteria: NONE SEEN
Blood: NEGATIVE
Glucose,UR: NEGATIVE mg/dL (ref 0–75)
Ketone: NEGATIVE
Leukocyte Esterase: NEGATIVE
Ph: 6 (ref 4.5–8.0)
Squamous Epithelial: <1
WBC UR: NONE SEEN /HPF (ref 0–5)

## 2013-03-04 LAB — CBC
HCT: 33.8 % — ABNORMAL LOW (ref 35.0–47.0)
HGB: 10.9 g/dL — ABNORMAL LOW (ref 12.0–16.0)
MCH: 29.3 pg (ref 26.0–34.0)
MCHC: 32.3 g/dL (ref 32.0–36.0)
MCV: 91 fL (ref 80–100)
Platelet: 194 10*3/uL (ref 150–440)
RBC: 3.73 10*6/uL — ABNORMAL LOW (ref 3.80–5.20)
RDW: 19.1 % — ABNORMAL HIGH (ref 11.5–14.5)
WBC: 5.3 10*3/uL (ref 3.6–11.0)

## 2013-03-04 LAB — CK TOTAL AND CKMB (NOT AT ARMC)
CK, Total: 51 U/L (ref 21–215)
CK-MB: 2.8 ng/mL (ref 0.5–3.6)

## 2013-03-04 LAB — PRO B NATRIURETIC PEPTIDE: B-Type Natriuretic Peptide: 1018 pg/mL — ABNORMAL HIGH (ref 0–125)

## 2013-03-04 LAB — TROPONIN I: Troponin-I: <0.02 ng/mL

## 2013-03-05 LAB — CBC WITH DIFFERENTIAL/PLATELET
Basophil #: 0 10*3/uL (ref 0.0–0.1)
Eosinophil %: 0 %
HGB: 10.1 g/dL — ABNORMAL LOW (ref 12.0–16.0)
Lymphocyte %: 11.2 %
MCH: 29.4 pg (ref 26.0–34.0)
Monocyte #: 0.2 x10 3/mm (ref 0.2–0.9)
Monocyte %: 2.8 %
Neutrophil %: 85.8 %
Platelet: 200 10*3/uL (ref 150–440)
RDW: 18.5 % — ABNORMAL HIGH (ref 11.5–14.5)

## 2013-03-05 LAB — MAGNESIUM: Magnesium: 1.4 mg/dL — ABNORMAL LOW

## 2013-03-06 LAB — URINE CULTURE

## 2013-03-08 ENCOUNTER — Emergency Department: Payer: Self-pay | Admitting: Emergency Medicine

## 2013-03-08 LAB — COMPREHENSIVE METABOLIC PANEL
Albumin: 2.8 g/dL — ABNORMAL LOW (ref 3.4–5.0)
Anion Gap: 2 — ABNORMAL LOW (ref 7–16)
BUN: 16 mg/dL (ref 7–18)
Calcium, Total: 8.3 mg/dL — ABNORMAL LOW (ref 8.5–10.1)
Chloride: 104 mmol/L (ref 98–107)
Creatinine: 0.79 mg/dL (ref 0.60–1.30)
EGFR (African American): >60
EGFR (Non-African Amer.): >60
Glucose: 75 mg/dL (ref 65–99)
SGOT(AST): 17 U/L (ref 15–37)
SGPT (ALT): 14 U/L (ref 12–78)
Total Protein: 6.1 g/dL — ABNORMAL LOW (ref 6.4–8.2)

## 2013-03-08 LAB — CBC
HCT: 32.1 % — ABNORMAL LOW (ref 35.0–47.0)
HGB: 10.3 g/dL — ABNORMAL LOW (ref 12.0–16.0)
MCH: 29.2 pg (ref 26.0–34.0)
MCHC: 32.2 g/dL (ref 32.0–36.0)
RBC: 3.53 10*6/uL — ABNORMAL LOW (ref 3.80–5.20)
RDW: 18.4 % — ABNORMAL HIGH (ref 11.5–14.5)
WBC: 6.6 10*3/uL (ref 3.6–11.0)

## 2013-03-08 LAB — URINALYSIS, COMPLETE
Bacteria: NONE SEEN
Bilirubin,UR: NEGATIVE
Blood: NEGATIVE
Glucose,UR: NEGATIVE mg/dL (ref 0–75)
Nitrite: NEGATIVE
RBC,UR: NONE SEEN /HPF (ref 0–5)
Specific Gravity: 1.009 (ref 1.003–1.030)
Squamous Epithelial: 1

## 2013-03-08 LAB — TSH: Thyroid Stimulating Horm: 3.37 u[IU]/mL

## 2013-03-08 LAB — T4, FREE: Free Thyroxine: 1.25 ng/dL (ref 0.76–1.46)

## 2013-03-08 LAB — TROPONIN I: Troponin-I: <0.02 ng/mL

## 2013-03-08 LAB — LIPASE, BLOOD: Lipase: 38 U/L — ABNORMAL LOW (ref 73–393)

## 2013-03-09 ENCOUNTER — Ambulatory Visit: Payer: Self-pay | Admitting: Internal Medicine

## 2013-03-14 ENCOUNTER — Emergency Department: Payer: Self-pay | Admitting: Emergency Medicine

## 2013-03-20 ENCOUNTER — Ambulatory Visit: Payer: Self-pay | Admitting: Internal Medicine

## 2013-04-10 ENCOUNTER — Emergency Department: Payer: Self-pay | Admitting: Emergency Medicine

## 2013-04-10 LAB — COMPREHENSIVE METABOLIC PANEL
Albumin: 3 g/dL — ABNORMAL LOW (ref 3.4–5.0)
Alkaline Phosphatase: 126 U/L — ABNORMAL HIGH
Anion Gap: 5 — ABNORMAL LOW (ref 7–16)
Bilirubin,Total: 0.3 mg/dL (ref 0.2–1.0)
Calcium, Total: 9 mg/dL (ref 8.5–10.1)
Creatinine: 0.75 mg/dL (ref 0.60–1.30)
EGFR (African American): >60
EGFR (Non-African Amer.): >60
Osmolality: 278 (ref 275–301)
Potassium: 3.7 mmol/L (ref 3.5–5.1)
SGPT (ALT): 11 U/L — ABNORMAL LOW (ref 12–78)
Sodium: 139 mmol/L (ref 136–145)
Total Protein: 6.4 g/dL (ref 6.4–8.2)

## 2013-04-10 LAB — URINALYSIS, COMPLETE
Bacteria: NONE SEEN
Bilirubin,UR: NEGATIVE
Blood: NEGATIVE
Ketone: NEGATIVE
Nitrite: NEGATIVE
Protein: NEGATIVE
Specific Gravity: 1.011 (ref 1.003–1.030)
Squamous Epithelial: <1
WBC UR: <1 /HPF (ref 0–5)

## 2013-04-10 LAB — CBC WITH DIFFERENTIAL/PLATELET
Basophil #: 0.1 10*3/uL (ref 0.0–0.1)
HCT: 33.8 % — ABNORMAL LOW (ref 35.0–47.0)
HGB: 10.8 g/dL — ABNORMAL LOW (ref 12.0–16.0)
Lymphocyte %: 30.2 %
MCV: 93 fL (ref 80–100)
Monocyte #: 0.5 x10 3/mm (ref 0.2–0.9)
Monocyte %: 9.8 %
Neutrophil #: 2.9 10*3/uL (ref 1.4–6.5)
Neutrophil %: 54.5 %
Platelet: 220 10*3/uL (ref 150–440)
RBC: 3.63 10*6/uL — ABNORMAL LOW (ref 3.80–5.20)

## 2013-04-10 LAB — CK TOTAL AND CKMB (NOT AT ARMC)
CK, Total: 45 U/L (ref 21–215)
CK-MB: 2.2 ng/mL (ref 0.5–3.6)

## 2013-04-10 LAB — TROPONIN I: Troponin-I: <0.02 ng/mL

## 2013-04-17 ENCOUNTER — Ambulatory Visit: Payer: Self-pay | Admitting: Internal Medicine

## 2013-04-21 ENCOUNTER — Emergency Department: Payer: Self-pay | Admitting: Emergency Medicine

## 2013-04-21 LAB — CBC WITH DIFFERENTIAL/PLATELET
Basophil #: 0 10*3/uL (ref 0.0–0.1)
Eosinophil #: 0.2 10*3/uL (ref 0.0–0.7)
Eosinophil %: 4 %
HGB: 10.8 g/dL — ABNORMAL LOW (ref 12.0–16.0)
Lymphocyte #: 1.5 10*3/uL (ref 1.0–3.6)
Lymphocyte %: 24.3 %
MCH: 28.7 pg (ref 26.0–34.0)
MCV: 92 fL (ref 80–100)
Monocyte #: 0.4 x10 3/mm (ref 0.2–0.9)
Monocyte %: 6 %
Neutrophil #: 4 10*3/uL (ref 1.4–6.5)
RBC: 3.75 10*6/uL — ABNORMAL LOW (ref 3.80–5.20)
RDW: 18.1 % — ABNORMAL HIGH (ref 11.5–14.5)
WBC: 6.1 10*3/uL (ref 3.6–11.0)

## 2013-04-21 LAB — COMPREHENSIVE METABOLIC PANEL
Alkaline Phosphatase: 138 U/L — ABNORMAL HIGH
Anion Gap: 2 — ABNORMAL LOW (ref 7–16)
BUN: 11 mg/dL (ref 7–18)
Bilirubin,Total: 0.3 mg/dL (ref 0.2–1.0)
Calcium, Total: 8.7 mg/dL (ref 8.5–10.1)
EGFR (African American): >60
Glucose: 84 mg/dL (ref 65–99)
Potassium: 3.7 mmol/L (ref 3.5–5.1)
Sodium: 139 mmol/L (ref 136–145)
Total Protein: 6.6 g/dL (ref 6.4–8.2)

## 2013-04-21 LAB — TROPONIN I: Troponin-I: <0.02 ng/mL

## 2013-04-29 ENCOUNTER — Emergency Department: Payer: Self-pay | Admitting: Emergency Medicine

## 2013-04-30 ENCOUNTER — Emergency Department: Payer: Self-pay | Admitting: Emergency Medicine

## 2013-04-30 LAB — COMPREHENSIVE METABOLIC PANEL
Albumin: 3 g/dL — ABNORMAL LOW (ref 3.4–5.0)
Chloride: 102 mmol/L (ref 98–107)
EGFR (African American): >60
EGFR (Non-African Amer.): >60
Glucose: 117 mg/dL — ABNORMAL HIGH (ref 65–99)
Osmolality: 278 (ref 275–301)
Potassium: 3.6 mmol/L (ref 3.5–5.1)
SGOT(AST): 38 U/L — ABNORMAL HIGH (ref 15–37)
SGPT (ALT): 20 U/L (ref 12–78)
Sodium: 140 mmol/L (ref 136–145)
Total Protein: 6.3 g/dL — ABNORMAL LOW (ref 6.4–8.2)

## 2013-04-30 LAB — CBC
HGB: 10 g/dL — ABNORMAL LOW (ref 12.0–16.0)
MCV: 91 fL (ref 80–100)
Platelet: 211 10*3/uL (ref 150–440)
RBC: 3.54 10*6/uL — ABNORMAL LOW (ref 3.80–5.20)

## 2013-05-05 ENCOUNTER — Emergency Department: Payer: Self-pay | Admitting: Emergency Medicine

## 2013-05-05 LAB — BASIC METABOLIC PANEL
BUN: 12 mg/dL (ref 7–18)
Calcium, Total: 8.8 mg/dL (ref 8.5–10.1)
Chloride: 104 mmol/L (ref 98–107)
Co2: 34 mmol/L — ABNORMAL HIGH (ref 21–32)
Creatinine: 0.76 mg/dL (ref 0.60–1.30)
EGFR (Non-African Amer.): >60
Glucose: 61 mg/dL — ABNORMAL LOW (ref 65–99)
Osmolality: 271 (ref 275–301)
Sodium: 137 mmol/L (ref 136–145)

## 2013-05-05 LAB — CBC
HCT: 32.1 % — ABNORMAL LOW (ref 35.0–47.0)
HGB: 10.4 g/dL — ABNORMAL LOW (ref 12.0–16.0)
MCV: 91 fL (ref 80–100)
RDW: 17.1 % — ABNORMAL HIGH (ref 11.5–14.5)
WBC: 5.3 10*3/uL (ref 3.6–11.0)

## 2013-05-05 LAB — TSH: Thyroid Stimulating Horm: 2.33 u[IU]/mL

## 2013-05-05 LAB — ETHANOL
Ethanol %: <0.003 % (ref 0.000–0.080)
Ethanol: <3 mg/dL

## 2013-05-05 LAB — ACETAMINOPHEN LEVEL: Acetaminophen: <2 ug/mL

## 2013-05-05 LAB — SALICYLATE LEVEL: Salicylates, Serum: 3.5 mg/dL — ABNORMAL HIGH

## 2013-05-06 LAB — URINALYSIS, COMPLETE
Bacteria: NONE SEEN
Glucose,UR: NEGATIVE mg/dL (ref 0–75)
Nitrite: NEGATIVE
Ph: 7 (ref 4.5–8.0)
RBC,UR: NONE SEEN /HPF (ref 0–5)
Squamous Epithelial: <1

## 2013-05-06 LAB — DRUG SCREEN, URINE
Amphetamines, Ur Screen: NEGATIVE (ref ?–1000)
Barbiturates, Ur Screen: NEGATIVE (ref ?–200)
Cannabinoid 50 Ng, Ur ~~LOC~~: NEGATIVE (ref ?–50)
Cocaine Metabolite,Ur ~~LOC~~: NEGATIVE (ref ?–300)
Methadone, Ur Screen: POSITIVE (ref ?–300)
Opiate, Ur Screen: NEGATIVE (ref ?–300)
Tricyclic, Ur Screen: NEGATIVE (ref ?–1000)

## 2013-05-07 LAB — ETHANOL: Ethanol: <3 mg/dL

## 2013-05-07 LAB — CBC
HCT: 34.8 % — ABNORMAL LOW (ref 35.0–47.0)
HGB: 11 g/dL — ABNORMAL LOW (ref 12.0–16.0)
MCV: 90 fL (ref 80–100)
RDW: 17.3 % — ABNORMAL HIGH (ref 11.5–14.5)
WBC: 5 10*3/uL (ref 3.6–11.0)

## 2013-05-07 LAB — COMPREHENSIVE METABOLIC PANEL
Alkaline Phosphatase: 113 U/L
Anion Gap: 1 — ABNORMAL LOW (ref 7–16)
Bilirubin,Total: 0.4 mg/dL (ref 0.2–1.0)
Calcium, Total: 8.7 mg/dL (ref 8.5–10.1)
Chloride: 104 mmol/L (ref 98–107)
Co2: 30 mmol/L (ref 21–32)
Creatinine: 0.85 mg/dL (ref 0.60–1.30)
EGFR (Non-African Amer.): >60
Osmolality: 272 (ref 275–301)
SGOT(AST): 24 U/L (ref 15–37)
Sodium: 135 mmol/L — ABNORMAL LOW (ref 136–145)

## 2013-05-07 LAB — ACETAMINOPHEN LEVEL: Acetaminophen: 45 ug/mL — ABNORMAL HIGH

## 2013-05-08 ENCOUNTER — Inpatient Hospital Stay: Payer: Self-pay | Admitting: Psychiatry

## 2013-05-15 ENCOUNTER — Emergency Department: Payer: Self-pay | Admitting: Emergency Medicine

## 2013-05-15 LAB — CBC
HCT: 36.7 % (ref 35.0–47.0)
HGB: 11.6 g/dL — ABNORMAL LOW (ref 12.0–16.0)
MCH: 27.7 pg (ref 26.0–34.0)
MCHC: 31.5 g/dL — ABNORMAL LOW (ref 32.0–36.0)
MCV: 88 fL (ref 80–100)
Platelet: 275 10*3/uL (ref 150–440)
RBC: 4.18 10*6/uL (ref 3.80–5.20)
RDW: 17.4 % — AB (ref 11.5–14.5)
WBC: 4.9 10*3/uL (ref 3.6–11.0)

## 2013-05-15 LAB — COMPREHENSIVE METABOLIC PANEL
ALBUMIN: 2.8 g/dL — AB (ref 3.4–5.0)
AST: 22 U/L (ref 15–37)
Alkaline Phosphatase: 125 U/L — ABNORMAL HIGH
Anion Gap: 1 — ABNORMAL LOW (ref 7–16)
BILIRUBIN TOTAL: 0.3 mg/dL (ref 0.2–1.0)
BUN: 17 mg/dL (ref 7–18)
CALCIUM: 8.4 mg/dL — AB (ref 8.5–10.1)
Chloride: 106 mmol/L (ref 98–107)
Co2: 29 mmol/L (ref 21–32)
Creatinine: 0.88 mg/dL (ref 0.60–1.30)
Glucose: 88 mg/dL (ref 65–99)
OSMOLALITY: 273 (ref 275–301)
POTASSIUM: 4.2 mmol/L (ref 3.5–5.1)
SGPT (ALT): 18 U/L (ref 12–78)
Sodium: 136 mmol/L (ref 136–145)
Total Protein: 6.8 g/dL (ref 6.4–8.2)

## 2013-05-15 LAB — URINALYSIS, COMPLETE
BILIRUBIN, UR: NEGATIVE
Bacteria: NONE SEEN
Blood: NEGATIVE
Glucose,UR: NEGATIVE mg/dL (ref 0–75)
KETONE: NEGATIVE
Leukocyte Esterase: NEGATIVE
NITRITE: NEGATIVE
PH: 6 (ref 4.5–8.0)
Protein: NEGATIVE
RBC,UR: 1 /HPF (ref 0–5)
Specific Gravity: 1.017 (ref 1.003–1.030)
Squamous Epithelial: NONE SEEN

## 2013-05-15 LAB — DRUG SCREEN, URINE
AMPHETAMINES, UR SCREEN: NEGATIVE (ref ?–1000)
Barbiturates, Ur Screen: NEGATIVE (ref ?–200)
Benzodiazepine, Ur Scrn: NEGATIVE (ref ?–200)
CANNABINOID 50 NG, UR ~~LOC~~: NEGATIVE (ref ?–50)
Cocaine Metabolite,Ur ~~LOC~~: NEGATIVE (ref ?–300)
MDMA (Ecstasy)Ur Screen: NEGATIVE (ref ?–500)
Methadone, Ur Screen: POSITIVE (ref ?–300)
Opiate, Ur Screen: NEGATIVE (ref ?–300)
Phencyclidine (PCP) Ur S: NEGATIVE (ref ?–25)
Tricyclic, Ur Screen: NEGATIVE (ref ?–1000)

## 2013-05-15 LAB — ACETAMINOPHEN LEVEL: ACETAMINOPHEN: 18 ug/mL

## 2013-05-15 LAB — ETHANOL: Ethanol: <3 mg/dL

## 2013-05-15 LAB — SALICYLATE LEVEL: Salicylates, Serum: 3.3 mg/dL — ABNORMAL HIGH

## 2013-05-15 LAB — TSH: Thyroid Stimulating Horm: 1.78 u[IU]/mL

## 2013-05-17 ENCOUNTER — Emergency Department: Payer: Self-pay | Admitting: Emergency Medicine

## 2013-05-17 LAB — COMPREHENSIVE METABOLIC PANEL
Albumin: 3.3 g/dL — ABNORMAL LOW (ref 3.4–5.0)
Alkaline Phosphatase: 128 U/L — ABNORMAL HIGH
Anion Gap: 9 (ref 7–16)
BUN: 16 mg/dL (ref 7–18)
Bilirubin,Total: 0.5 mg/dL (ref 0.2–1.0)
Calcium, Total: 8.9 mg/dL (ref 8.5–10.1)
Chloride: 109 mmol/L — ABNORMAL HIGH (ref 98–107)
Co2: 24 mmol/L (ref 21–32)
Creatinine: 0.9 mg/dL (ref 0.60–1.30)
EGFR (African American): >60
EGFR (Non-African Amer.): >60
Glucose: 107 mg/dL — ABNORMAL HIGH (ref 65–99)
Osmolality: 285 (ref 275–301)
Potassium: 4 mmol/L (ref 3.5–5.1)
SGOT(AST): 11 U/L — ABNORMAL LOW (ref 15–37)
SGPT (ALT): 14 U/L (ref 12–78)
Sodium: 142 mmol/L (ref 136–145)
Total Protein: 7.1 g/dL (ref 6.4–8.2)

## 2013-05-17 LAB — CBC
HCT: 37 % (ref 35.0–47.0)
HGB: 12.1 g/dL (ref 12.0–16.0)
MCH: 28.5 pg (ref 26.0–34.0)
MCHC: 32.7 g/dL (ref 32.0–36.0)
MCV: 87 fL (ref 80–100)
PLATELETS: 341 10*3/uL (ref 150–440)
RBC: 4.24 10*6/uL (ref 3.80–5.20)
RDW: 17.1 % — AB (ref 11.5–14.5)
WBC: 6.7 10*3/uL (ref 3.6–11.0)

## 2013-05-17 LAB — CK TOTAL AND CKMB (NOT AT ARMC)
CK, Total: 30 U/L (ref 21–215)
CK-MB: 1.7 ng/mL (ref 0.5–3.6)

## 2013-05-17 LAB — TROPONIN I: Troponin-I: <0.02 ng/mL

## 2013-05-19 ENCOUNTER — Emergency Department: Payer: Self-pay | Admitting: Emergency Medicine

## 2013-05-19 LAB — CBC
HCT: 37 % (ref 35.0–47.0)
HGB: 11.8 g/dL — ABNORMAL LOW (ref 12.0–16.0)
MCH: 28.1 pg (ref 26.0–34.0)
MCHC: 32 g/dL (ref 32.0–36.0)
MCV: 88 fL (ref 80–100)
Platelet: 324 10*3/uL (ref 150–440)
RBC: 4.21 10*6/uL (ref 3.80–5.20)
RDW: 17 % — ABNORMAL HIGH (ref 11.5–14.5)
WBC: 6.4 10*3/uL (ref 3.6–11.0)

## 2013-05-19 LAB — URINALYSIS, COMPLETE
Bilirubin,UR: NEGATIVE
Blood: NEGATIVE
GLUCOSE, UR: NEGATIVE mg/dL (ref 0–75)
Hyaline Cast: 1
Ketone: NEGATIVE
Leukocyte Esterase: NEGATIVE
Nitrite: NEGATIVE
Ph: 5 (ref 4.5–8.0)
Protein: NEGATIVE
RBC,UR: <1 /HPF (ref 0–5)
Specific Gravity: 1.015 (ref 1.003–1.030)
Squamous Epithelial: <1
WBC UR: <1 /HPF (ref 0–5)

## 2013-05-19 LAB — BASIC METABOLIC PANEL
Anion Gap: 3 — ABNORMAL LOW (ref 7–16)
BUN: 14 mg/dL (ref 7–18)
CHLORIDE: 111 mmol/L — AB (ref 98–107)
Calcium, Total: 8.8 mg/dL (ref 8.5–10.1)
Co2: 24 mmol/L (ref 21–32)
Creatinine: 0.88 mg/dL (ref 0.60–1.30)
EGFR (African American): >60
Glucose: 92 mg/dL (ref 65–99)
Osmolality: 276 (ref 275–301)
Potassium: 3.8 mmol/L (ref 3.5–5.1)
Sodium: 138 mmol/L (ref 136–145)

## 2013-05-19 LAB — DRUG SCREEN, URINE
AMPHETAMINES, UR SCREEN: NEGATIVE (ref ?–1000)
BENZODIAZEPINE, UR SCRN: NEGATIVE (ref ?–200)
Barbiturates, Ur Screen: NEGATIVE (ref ?–200)
CANNABINOID 50 NG, UR ~~LOC~~: NEGATIVE (ref ?–50)
Cocaine Metabolite,Ur ~~LOC~~: NEGATIVE (ref ?–300)
MDMA (ECSTASY) UR SCREEN: NEGATIVE (ref ?–500)
METHADONE, UR SCREEN: POSITIVE (ref ?–300)
Opiate, Ur Screen: NEGATIVE (ref ?–300)
PHENCYCLIDINE (PCP) UR S: NEGATIVE (ref ?–25)
TRICYCLIC, UR SCREEN: POSITIVE (ref ?–1000)

## 2013-05-19 LAB — PRO B NATRIURETIC PEPTIDE: B-TYPE NATIURETIC PEPTID: 1188 pg/mL — AB (ref 0–125)

## 2013-05-19 LAB — TROPONIN I: Troponin-I: <0.02 ng/mL

## 2013-05-19 LAB — PROTIME-INR
INR: 1
PROTHROMBIN TIME: 13.3 s (ref 11.5–14.7)

## 2013-05-23 ENCOUNTER — Ambulatory Visit: Payer: Self-pay | Admitting: Family Medicine

## 2013-05-25 ENCOUNTER — Emergency Department: Payer: Self-pay | Admitting: Internal Medicine

## 2013-05-25 LAB — COMPREHENSIVE METABOLIC PANEL
Albumin: 3.3 g/dL — ABNORMAL LOW (ref 3.4–5.0)
Alkaline Phosphatase: 150 U/L — ABNORMAL HIGH
Anion Gap: 8 (ref 7–16)
BILIRUBIN TOTAL: 0.5 mg/dL (ref 0.2–1.0)
BUN: 14 mg/dL (ref 7–18)
CO2: 20 mmol/L — AB (ref 21–32)
Calcium, Total: 8.6 mg/dL (ref 8.5–10.1)
Chloride: 115 mmol/L — ABNORMAL HIGH (ref 98–107)
Creatinine: 0.88 mg/dL (ref 0.60–1.30)
EGFR (African American): >60
EGFR (Non-African Amer.): >60
Glucose: 102 mg/dL — ABNORMAL HIGH (ref 65–99)
OSMOLALITY: 286 (ref 275–301)
Potassium: 3 mmol/L — ABNORMAL LOW (ref 3.5–5.1)
SGOT(AST): 9 U/L — ABNORMAL LOW (ref 15–37)
SGPT (ALT): 12 U/L (ref 12–78)
Sodium: 143 mmol/L (ref 136–145)
Total Protein: 6.8 g/dL (ref 6.4–8.2)

## 2013-05-25 LAB — CBC
HCT: 36.3 % (ref 35.0–47.0)
HGB: 11.5 g/dL — ABNORMAL LOW (ref 12.0–16.0)
MCH: 28 pg (ref 26.0–34.0)
MCHC: 31.8 g/dL — AB (ref 32.0–36.0)
MCV: 88 fL (ref 80–100)
Platelet: 271 10*3/uL (ref 150–440)
RBC: 4.12 10*6/uL (ref 3.80–5.20)
RDW: 17.8 % — AB (ref 11.5–14.5)
WBC: 6.5 10*3/uL (ref 3.6–11.0)

## 2013-05-25 LAB — RAPID INFLUENZA A&B ANTIGENS

## 2013-05-28 DIAGNOSIS — E039 Hypothyroidism, unspecified: Secondary | ICD-10-CM | POA: Insufficient documentation

## 2013-05-28 DIAGNOSIS — F39 Unspecified mood [affective] disorder: Secondary | ICD-10-CM | POA: Insufficient documentation

## 2013-05-28 DIAGNOSIS — J449 Chronic obstructive pulmonary disease, unspecified: Secondary | ICD-10-CM | POA: Insufficient documentation

## 2013-05-28 DIAGNOSIS — R079 Chest pain, unspecified: Secondary | ICD-10-CM | POA: Insufficient documentation

## 2013-05-28 DIAGNOSIS — F1110 Opioid abuse, uncomplicated: Secondary | ICD-10-CM | POA: Insufficient documentation

## 2013-05-28 DIAGNOSIS — I2510 Atherosclerotic heart disease of native coronary artery without angina pectoris: Secondary | ICD-10-CM | POA: Insufficient documentation

## 2013-05-30 ENCOUNTER — Ambulatory Visit: Payer: Self-pay | Admitting: Internal Medicine

## 2013-05-31 ENCOUNTER — Ambulatory Visit: Payer: Self-pay | Admitting: Internal Medicine

## 2013-05-31 LAB — BASIC METABOLIC PANEL
Anion Gap: 12 (ref 7–16)
BUN: 12 mg/dL (ref 7–18)
CREATININE: 0.78 mg/dL (ref 0.60–1.30)
Calcium, Total: 8.7 mg/dL (ref 8.5–10.1)
Chloride: 105 mmol/L (ref 98–107)
Co2: 22 mmol/L (ref 21–32)
EGFR (African American): >60
EGFR (Non-African Amer.): >60
Glucose: 119 mg/dL — ABNORMAL HIGH (ref 65–99)
OSMOLALITY: 278 (ref 275–301)
Potassium: 2.8 mmol/L — ABNORMAL LOW (ref 3.5–5.1)
Sodium: 139 mmol/L (ref 136–145)

## 2013-05-31 LAB — CBC CANCER CENTER
Basophil #: 0 x10 3/mm (ref 0.0–0.1)
Basophil %: 0.1 %
Eosinophil #: 0 x10 3/mm (ref 0.0–0.7)
Eosinophil %: 0.1 %
HCT: 38.5 % (ref 35.0–47.0)
HGB: 12.1 g/dL (ref 12.0–16.0)
LYMPHS ABS: 1.9 x10 3/mm (ref 1.0–3.6)
LYMPHS PCT: 17.3 %
MCH: 27.7 pg (ref 26.0–34.0)
MCHC: 31.5 g/dL — AB (ref 32.0–36.0)
MCV: 88 fL (ref 80–100)
MONO ABS: 0.3 x10 3/mm (ref 0.2–0.9)
MONOS PCT: 2.6 %
Neutrophil #: 9 x10 3/mm — ABNORMAL HIGH (ref 1.4–6.5)
Neutrophil %: 79.9 %
Platelet: 353 x10 3/mm (ref 150–440)
RBC: 4.38 10*6/uL (ref 3.80–5.20)
RDW: 18.3 % — ABNORMAL HIGH (ref 11.5–14.5)
WBC: 11.2 x10 3/mm — ABNORMAL HIGH (ref 3.6–11.0)

## 2013-05-31 LAB — HEPATIC FUNCTION PANEL A (ARMC)
ALBUMIN: 3.6 g/dL (ref 3.4–5.0)
ALK PHOS: 151 U/L — AB
ALT: 13 U/L (ref 12–78)
AST: 10 U/L — AB (ref 15–37)
Bilirubin, Direct: 0.2 mg/dL (ref 0.00–0.20)
Bilirubin,Total: 0.4 mg/dL (ref 0.2–1.0)
Total Protein: 7.8 g/dL (ref 6.4–8.2)

## 2013-06-01 ENCOUNTER — Emergency Department: Payer: Self-pay | Admitting: Emergency Medicine

## 2013-06-01 LAB — COMPREHENSIVE METABOLIC PANEL
ALBUMIN: 3.8 g/dL (ref 3.4–5.0)
ALT: 13 U/L (ref 12–78)
AST: 8 U/L — AB (ref 15–37)
Alkaline Phosphatase: 144 U/L — ABNORMAL HIGH
Anion Gap: 4 — ABNORMAL LOW (ref 7–16)
BILIRUBIN TOTAL: 0.5 mg/dL (ref 0.2–1.0)
BUN: 16 mg/dL (ref 7–18)
CALCIUM: 9.1 mg/dL (ref 8.5–10.1)
CHLORIDE: 114 mmol/L — AB (ref 98–107)
CO2: 25 mmol/L (ref 21–32)
Creatinine: 0.8 mg/dL (ref 0.60–1.30)
Glucose: 131 mg/dL — ABNORMAL HIGH (ref 65–99)
OSMOLALITY: 288 (ref 275–301)
Potassium: 2.9 mmol/L — ABNORMAL LOW (ref 3.5–5.1)
Sodium: 143 mmol/L (ref 136–145)
Total Protein: 7.8 g/dL (ref 6.4–8.2)

## 2013-06-01 LAB — URINALYSIS, COMPLETE
BILIRUBIN, UR: NEGATIVE
GLUCOSE, UR: NEGATIVE mg/dL (ref 0–75)
Ketone: NEGATIVE
Nitrite: POSITIVE
Ph: 5 (ref 4.5–8.0)
Protein: 30
RBC,UR: 1 /HPF (ref 0–5)
Specific Gravity: 1.02 (ref 1.003–1.030)
Squamous Epithelial: <1
WBC UR: 7 /HPF (ref 0–5)

## 2013-06-01 LAB — CBC
HCT: 37.8 % (ref 35.0–47.0)
HGB: 12 g/dL (ref 12.0–16.0)
MCH: 28.1 pg (ref 26.0–34.0)
MCHC: 31.6 g/dL — AB (ref 32.0–36.0)
MCV: 89 fL (ref 80–100)
PLATELETS: 311 10*3/uL (ref 150–440)
RBC: 4.26 10*6/uL (ref 3.80–5.20)
RDW: 19.1 % — AB (ref 11.5–14.5)
WBC: 11.4 10*3/uL — ABNORMAL HIGH (ref 3.6–11.0)

## 2013-06-01 LAB — TROPONIN I: Troponin-I: <0.02 ng/mL

## 2013-06-01 LAB — CANCER ANTIGEN 27.29: CA 27.29: 125.3 U/mL — ABNORMAL HIGH (ref 0.0–38.6)

## 2013-06-01 LAB — LIPASE, BLOOD: LIPASE: 284 U/L (ref 73–393)

## 2013-06-09 ENCOUNTER — Ambulatory Visit: Payer: Self-pay | Admitting: Internal Medicine

## 2013-06-29 ENCOUNTER — Emergency Department: Payer: Self-pay | Admitting: Emergency Medicine

## 2013-07-10 ENCOUNTER — Ambulatory Visit: Payer: Self-pay | Admitting: Internal Medicine

## 2013-07-11 ENCOUNTER — Ambulatory Visit: Payer: Self-pay | Admitting: Internal Medicine

## 2013-08-07 ENCOUNTER — Ambulatory Visit: Payer: Self-pay | Admitting: Internal Medicine

## 2013-08-16 LAB — CBC CANCER CENTER
Basophil #: 0 10*3/uL
Basophil %: 0.7 %
Eosinophil #: 0.1 10*3/uL
Eosinophil %: 2.1 %
HCT: 29 % — ABNORMAL LOW
HGB: 8.8 g/dL — ABNORMAL LOW
Lymphocyte %: 37.8 %
Lymphs Abs: 1.6 10*3/uL
MCH: 26.4 pg
MCHC: 30.5 g/dL — ABNORMAL LOW
MCV: 87 fL
Monocyte #: 0.3 10*3/uL
Monocyte %: 7.4 %
Neutrophil #: 2.2 10*3/uL
Neutrophil %: 52 %
Platelet: 271 10*3/uL
RBC: 3.35 10*6/uL — ABNORMAL LOW
RDW: 19.3 % — ABNORMAL HIGH
WBC: 4.1 10*3/uL

## 2013-08-16 LAB — BASIC METABOLIC PANEL WITH GFR
Anion Gap: 7
BUN: 11 mg/dL
Calcium, Total: 8.3 mg/dL — ABNORMAL LOW
Chloride: 106 mmol/L
Co2: 29 mmol/L
Creatinine: 0.76 mg/dL
EGFR (African American): >60
EGFR (Non-African Amer.): >60
Glucose: 71 mg/dL
Osmolality: 281
Potassium: 3.5 mmol/L
Sodium: 142 mmol/L

## 2013-08-16 LAB — HEPATIC FUNCTION PANEL A (ARMC)
Albumin: 2.5 g/dL — ABNORMAL LOW
Alkaline Phosphatase: 148 U/L — ABNORMAL HIGH
Bilirubin, Direct: 0.1 mg/dL
Bilirubin,Total: 0.3 mg/dL
SGOT(AST): 16 U/L
SGPT (ALT): 12 U/L
Total Protein: 6.4 g/dL

## 2013-08-16 LAB — MAGNESIUM: Magnesium: 1.5 mg/dL — ABNORMAL LOW

## 2013-08-16 LAB — PHOSPHORUS: Phosphorus: 3.2 mg/dL (ref 2.5–4.9)

## 2013-09-01 ENCOUNTER — Emergency Department: Payer: Self-pay | Admitting: Emergency Medicine

## 2013-09-01 LAB — BASIC METABOLIC PANEL
ANION GAP: 6 — AB (ref 7–16)
BUN: 9 mg/dL (ref 7–18)
CALCIUM: 8.1 mg/dL — AB (ref 8.5–10.1)
CHLORIDE: 107 mmol/L (ref 98–107)
CREATININE: 0.52 mg/dL — AB (ref 0.60–1.30)
Co2: 25 mmol/L (ref 21–32)
EGFR (African American): >60
Glucose: 117 mg/dL — ABNORMAL HIGH (ref 65–99)
Osmolality: 275 (ref 275–301)
Potassium: 4.5 mmol/L (ref 3.5–5.1)
Sodium: 138 mmol/L (ref 136–145)

## 2013-09-01 LAB — TROPONIN I

## 2013-09-02 LAB — CBC
HCT: 30.9 % — ABNORMAL LOW (ref 35.0–47.0)
HGB: 9.6 g/dL — AB (ref 12.0–16.0)
MCH: 27.2 pg (ref 26.0–34.0)
MCHC: 31.2 g/dL — AB (ref 32.0–36.0)
MCV: 87 fL (ref 80–100)
Platelet: 148 10*3/uL — ABNORMAL LOW (ref 150–440)
RBC: 3.55 10*6/uL — ABNORMAL LOW (ref 3.80–5.20)
RDW: 19.9 % — ABNORMAL HIGH (ref 11.5–14.5)
WBC: 2.9 10*3/uL — ABNORMAL LOW (ref 3.6–11.0)

## 2013-09-04 LAB — CBC CANCER CENTER
BASOS ABS: 0.1 x10 3/mm (ref 0.0–0.1)
BASOS PCT: 3.9 %
EOS ABS: 0.1 x10 3/mm (ref 0.0–0.7)
Eosinophil %: 2.8 %
HCT: 31.7 % — ABNORMAL LOW (ref 35.0–47.0)
HGB: 9.9 g/dL — ABNORMAL LOW (ref 12.0–16.0)
Lymphocyte #: 1.7 x10 3/mm (ref 1.0–3.6)
Lymphocyte %: 50.7 %
MCH: 26.7 pg (ref 26.0–34.0)
MCHC: 31.2 g/dL — ABNORMAL LOW (ref 32.0–36.0)
MCV: 86 fL (ref 80–100)
MONO ABS: 0.4 x10 3/mm (ref 0.2–0.9)
Monocyte %: 12.2 %
Neutrophil #: 1 x10 3/mm — ABNORMAL LOW (ref 1.4–6.5)
Neutrophil %: 30.4 %
PLATELETS: 129 x10 3/mm — AB (ref 150–440)
RBC: 3.71 10*6/uL — ABNORMAL LOW (ref 3.80–5.20)
RDW: 19.8 % — ABNORMAL HIGH (ref 11.5–14.5)
WBC: 3.4 x10 3/mm — ABNORMAL LOW (ref 3.6–11.0)

## 2013-09-04 LAB — HEPATIC FUNCTION PANEL A (ARMC)
ALK PHOS: 155 U/L — AB
ALT: 11 U/L — AB (ref 12–78)
Albumin: 2.6 g/dL — ABNORMAL LOW (ref 3.4–5.0)
Bilirubin, Direct: 0.1 mg/dL (ref 0.00–0.20)
Bilirubin,Total: 0.2 mg/dL (ref 0.2–1.0)
SGOT(AST): 19 U/L (ref 15–37)
TOTAL PROTEIN: 7 g/dL (ref 6.4–8.2)

## 2013-09-04 LAB — BASIC METABOLIC PANEL
Anion Gap: 5 — ABNORMAL LOW (ref 7–16)
BUN: 8 mg/dL (ref 7–18)
CHLORIDE: 103 mmol/L (ref 98–107)
Calcium, Total: 8.6 mg/dL (ref 8.5–10.1)
Co2: 31 mmol/L (ref 21–32)
Creatinine: 0.68 mg/dL (ref 0.60–1.30)
EGFR (African American): >60
EGFR (Non-African Amer.): >60
GLUCOSE: 82 mg/dL (ref 65–99)
OSMOLALITY: 275 (ref 275–301)
Potassium: 3.8 mmol/L (ref 3.5–5.1)
SODIUM: 139 mmol/L (ref 136–145)

## 2013-09-04 LAB — MAGNESIUM: Magnesium: 1.8 mg/dL

## 2013-09-04 LAB — PHOSPHORUS: PHOSPHORUS: 2.7 mg/dL (ref 2.5–4.9)

## 2013-09-06 ENCOUNTER — Ambulatory Visit: Payer: Self-pay | Admitting: Internal Medicine

## 2013-10-06 ENCOUNTER — Emergency Department: Payer: Self-pay | Admitting: Emergency Medicine

## 2013-10-09 ENCOUNTER — Ambulatory Visit: Payer: Self-pay | Admitting: Internal Medicine

## 2013-10-09 LAB — CBC CANCER CENTER
Basophil #: 0 "x10 3/mm "
Basophil %: 1.2 %
Eosinophil #: 0.1 "x10 3/mm "
Eosinophil %: 2.3 %
HCT: 29.2 % — ABNORMAL LOW
HGB: 8.9 g/dL — ABNORMAL LOW
Lymphocyte %: 39.5 %
Lymphs Abs: 1.4 "x10 3/mm "
MCH: 26.3 pg
MCHC: 30.6 g/dL — ABNORMAL LOW
MCV: 86 fL
Monocyte #: 0.4 "x10 3/mm "
Monocyte %: 11.2 %
Neutrophil #: 1.6 "x10 3/mm "
Neutrophil %: 45.8 %
Platelet: 244 "x10 3/mm "
RBC: 3.39 "x10 6/mm " — ABNORMAL LOW
RDW: 17.4 % — ABNORMAL HIGH
WBC: 3.6 "x10 3/mm "

## 2013-10-09 LAB — PHOSPHORUS: Phosphorus: 3.8 mg/dL (ref 2.5–4.9)

## 2013-10-09 LAB — BASIC METABOLIC PANEL WITH GFR
Anion Gap: 8
BUN: 12 mg/dL
Calcium, Total: 8.5 mg/dL
Chloride: 106 mmol/L
Co2: 28 mmol/L
Creatinine: 0.7 mg/dL
EGFR (African American): >60
EGFR (Non-African Amer.): >60
Glucose: 76 mg/dL
Osmolality: 282
Potassium: 3.4 mmol/L — ABNORMAL LOW
Sodium: 142 mmol/L

## 2013-10-09 LAB — MAGNESIUM: Magnesium: 1.9 mg/dL

## 2013-10-16 LAB — BASIC METABOLIC PANEL
ANION GAP: 8 (ref 7–16)
BUN: 17 mg/dL (ref 7–18)
CALCIUM: 8.4 mg/dL — AB (ref 8.5–10.1)
CO2: 27 mmol/L (ref 21–32)
Chloride: 108 mmol/L — ABNORMAL HIGH (ref 98–107)
Creatinine: 0.74 mg/dL (ref 0.60–1.30)
EGFR (African American): >60
EGFR (Non-African Amer.): >60
Glucose: 103 mg/dL — ABNORMAL HIGH (ref 65–99)
Osmolality: 287 (ref 275–301)
Potassium: 3.7 mmol/L (ref 3.5–5.1)
Sodium: 143 mmol/L (ref 136–145)

## 2013-10-16 LAB — CBC CANCER CENTER
BASOS PCT: 0.9 %
Basophil #: 0.1 x10 3/mm (ref 0.0–0.1)
EOS ABS: 0.1 x10 3/mm (ref 0.0–0.7)
EOS PCT: 2 %
HCT: 30.4 % — ABNORMAL LOW (ref 35.0–47.0)
HGB: 9.2 g/dL — ABNORMAL LOW (ref 12.0–16.0)
LYMPHS ABS: 2.2 x10 3/mm (ref 1.0–3.6)
Lymphocyte %: 40.2 %
MCH: 26.2 pg (ref 26.0–34.0)
MCHC: 30.1 g/dL — ABNORMAL LOW (ref 32.0–36.0)
MCV: 87 fL (ref 80–100)
Monocyte #: 0.3 x10 3/mm (ref 0.2–0.9)
Monocyte %: 5.7 %
NEUTROS ABS: 2.8 x10 3/mm (ref 1.4–6.5)
Neutrophil %: 51.2 %
Platelet: 271 x10 3/mm (ref 150–440)
RBC: 3.5 10*6/uL — AB (ref 3.80–5.20)
RDW: 18.1 % — ABNORMAL HIGH (ref 11.5–14.5)
WBC: 5.5 x10 3/mm (ref 3.6–11.0)

## 2013-10-16 LAB — HEPATIC FUNCTION PANEL A (ARMC)
ALBUMIN: 2.8 g/dL — AB (ref 3.4–5.0)
Alkaline Phosphatase: 142 U/L — ABNORMAL HIGH
BILIRUBIN TOTAL: 0.2 mg/dL (ref 0.2–1.0)
Bilirubin, Direct: 0.1 mg/dL (ref 0.00–0.20)
SGOT(AST): 13 U/L — ABNORMAL LOW (ref 15–37)
SGPT (ALT): 10 U/L — ABNORMAL LOW (ref 12–78)
Total Protein: 6.4 g/dL (ref 6.4–8.2)

## 2013-10-16 LAB — MAGNESIUM: Magnesium: 1.7 mg/dL — ABNORMAL LOW

## 2013-10-16 LAB — PHOSPHORUS: Phosphorus: 2.9 mg/dL (ref 2.5–4.9)

## 2013-11-06 ENCOUNTER — Ambulatory Visit: Payer: Self-pay | Admitting: Internal Medicine

## 2013-11-06 LAB — BASIC METABOLIC PANEL
ANION GAP: 3 — AB (ref 7–16)
BUN: 21 mg/dL — AB (ref 7–18)
CALCIUM: 8.8 mg/dL (ref 8.5–10.1)
Chloride: 104 mmol/L (ref 98–107)
Co2: 32 mmol/L (ref 21–32)
Creatinine: 0.95 mg/dL (ref 0.60–1.30)
EGFR (Non-African Amer.): >60
Glucose: 100 mg/dL — ABNORMAL HIGH (ref 65–99)
Osmolality: 281 (ref 275–301)
POTASSIUM: 3.8 mmol/L (ref 3.5–5.1)
SODIUM: 139 mmol/L (ref 136–145)

## 2013-11-06 LAB — CBC CANCER CENTER
BASOS PCT: 1.4 %
Basophil #: 0.1 x10 3/mm (ref 0.0–0.1)
EOS PCT: 3.4 %
Eosinophil #: 0.1 x10 3/mm (ref 0.0–0.7)
HCT: 32.7 % — AB (ref 35.0–47.0)
HGB: 9.8 g/dL — ABNORMAL LOW (ref 12.0–16.0)
Lymphocyte #: 1.6 x10 3/mm (ref 1.0–3.6)
Lymphocyte %: 42.7 %
MCH: 25.4 pg — AB (ref 26.0–34.0)
MCHC: 30.1 g/dL — AB (ref 32.0–36.0)
MCV: 85 fL (ref 80–100)
Monocyte #: 0.3 x10 3/mm (ref 0.2–0.9)
Monocyte %: 8.3 %
NEUTROS PCT: 44.2 %
Neutrophil #: 1.7 x10 3/mm (ref 1.4–6.5)
Platelet: 314 x10 3/mm (ref 150–440)
RBC: 3.87 10*6/uL (ref 3.80–5.20)
RDW: 17.9 % — ABNORMAL HIGH (ref 11.5–14.5)
WBC: 3.9 x10 3/mm (ref 3.6–11.0)

## 2013-11-06 LAB — PHOSPHORUS: PHOSPHORUS: 2.9 mg/dL (ref 2.5–4.9)

## 2013-11-06 LAB — HEPATIC FUNCTION PANEL A (ARMC)
ALBUMIN: 3.2 g/dL — AB (ref 3.4–5.0)
ALT: 21 U/L (ref 12–78)
Alkaline Phosphatase: 139 U/L — ABNORMAL HIGH
Bilirubin, Direct: <0.1 mg/dL (ref 0.00–0.20)
Bilirubin,Total: 0.3 mg/dL (ref 0.2–1.0)
SGOT(AST): 25 U/L (ref 15–37)
Total Protein: 7.1 g/dL (ref 6.4–8.2)

## 2013-11-06 LAB — MAGNESIUM: MAGNESIUM: 1.8 mg/dL

## 2013-11-20 LAB — CBC CANCER CENTER
BASOS ABS: 0 x10 3/mm (ref 0.0–0.1)
Basophil %: 0.8 %
EOS PCT: 3.1 %
Eosinophil #: 0.2 x10 3/mm (ref 0.0–0.7)
HCT: 31.8 % — ABNORMAL LOW (ref 35.0–47.0)
HGB: 9.9 g/dL — ABNORMAL LOW (ref 12.0–16.0)
Lymphocyte #: 1.8 x10 3/mm (ref 1.0–3.6)
Lymphocyte %: 29.9 %
MCH: 26.1 pg (ref 26.0–34.0)
MCHC: 31.1 g/dL — AB (ref 32.0–36.0)
MCV: 84 fL (ref 80–100)
Monocyte #: 0.4 x10 3/mm (ref 0.2–0.9)
Monocyte %: 6.1 %
NEUTROS ABS: 3.7 x10 3/mm (ref 1.4–6.5)
NEUTROS PCT: 60.1 %
PLATELETS: 235 x10 3/mm (ref 150–440)
RBC: 3.8 10*6/uL (ref 3.80–5.20)
RDW: 19 % — AB (ref 11.5–14.5)
WBC: 6.1 x10 3/mm (ref 3.6–11.0)

## 2013-11-20 LAB — COMPREHENSIVE METABOLIC PANEL
ANION GAP: 4 — AB (ref 7–16)
Albumin: 3 g/dL — ABNORMAL LOW (ref 3.4–5.0)
Alkaline Phosphatase: 145 U/L — ABNORMAL HIGH
BUN: 15 mg/dL (ref 7–18)
Bilirubin,Total: 0.3 mg/dL (ref 0.2–1.0)
CO2: 28 mmol/L (ref 21–32)
Calcium, Total: 8.1 mg/dL — ABNORMAL LOW (ref 8.5–10.1)
Chloride: 107 mmol/L (ref 98–107)
Creatinine: 0.72 mg/dL (ref 0.60–1.30)
EGFR (Non-African Amer.): >60
Glucose: 97 mg/dL (ref 65–99)
Osmolality: 278 (ref 275–301)
POTASSIUM: 4.2 mmol/L (ref 3.5–5.1)
SGOT(AST): 12 U/L — ABNORMAL LOW (ref 15–37)
SGPT (ALT): 9 U/L — ABNORMAL LOW (ref 12–78)
Sodium: 139 mmol/L (ref 136–145)
TOTAL PROTEIN: 6.4 g/dL (ref 6.4–8.2)

## 2013-11-26 ENCOUNTER — Emergency Department: Payer: Self-pay | Admitting: Emergency Medicine

## 2013-11-26 LAB — CBC
HCT: 31.2 % — ABNORMAL LOW (ref 35.0–47.0)
HGB: 9.3 g/dL — ABNORMAL LOW (ref 12.0–16.0)
MCH: 25.1 pg — ABNORMAL LOW (ref 26.0–34.0)
MCHC: 29.7 g/dL — ABNORMAL LOW (ref 32.0–36.0)
MCV: 84 fL (ref 80–100)
Platelet: 157 10*3/uL (ref 150–440)
RBC: 3.7 10*6/uL — ABNORMAL LOW (ref 3.80–5.20)
RDW: 19.7 % — ABNORMAL HIGH (ref 11.5–14.5)
WBC: 6.1 10*3/uL (ref 3.6–11.0)

## 2013-11-26 LAB — BASIC METABOLIC PANEL
Anion Gap: 4 — ABNORMAL LOW (ref 7–16)
BUN: 10 mg/dL (ref 7–18)
CALCIUM: 8.1 mg/dL — AB (ref 8.5–10.1)
CHLORIDE: 106 mmol/L (ref 98–107)
Co2: 31 mmol/L (ref 21–32)
Creatinine: 0.81 mg/dL (ref 0.60–1.30)
EGFR (African American): >60
EGFR (Non-African Amer.): >60
Glucose: 93 mg/dL (ref 65–99)
Osmolality: 280 (ref 275–301)
Potassium: 3.5 mmol/L (ref 3.5–5.1)
Sodium: 141 mmol/L (ref 136–145)

## 2013-11-26 LAB — URINALYSIS, COMPLETE
Bacteria: NONE SEEN
Bilirubin,UR: NEGATIVE
Blood: NEGATIVE
GLUCOSE, UR: NEGATIVE mg/dL (ref 0–75)
Ketone: NEGATIVE
Leukocyte Esterase: NEGATIVE
Nitrite: NEGATIVE
PH: 5 (ref 4.5–8.0)
Protein: NEGATIVE
RBC,UR: 1 /HPF (ref 0–5)
Specific Gravity: 1.009 (ref 1.003–1.030)
Squamous Epithelial: NONE SEEN
WBC UR: NONE SEEN /HPF (ref 0–5)

## 2013-11-26 LAB — TROPONIN I: Troponin-I: <0.02 ng/mL

## 2013-11-26 LAB — PROTIME-INR
INR: 1.1
Prothrombin Time: 14.1 secs (ref 11.5–14.7)

## 2013-11-27 LAB — CBC CANCER CENTER
Basophil #: 0.1 x10 3/mm (ref 0.0–0.1)
Basophil %: 0.9 %
EOS ABS: 0.2 x10 3/mm (ref 0.0–0.7)
Eosinophil %: 3.1 %
HCT: 32.6 % — AB (ref 35.0–47.0)
HGB: 9.9 g/dL — ABNORMAL LOW (ref 12.0–16.0)
LYMPHS ABS: 1.8 x10 3/mm (ref 1.0–3.6)
Lymphocyte %: 29.9 %
MCH: 25.3 pg — AB (ref 26.0–34.0)
MCHC: 30.3 g/dL — ABNORMAL LOW (ref 32.0–36.0)
MCV: 84 fL (ref 80–100)
Monocyte #: 0.3 x10 3/mm (ref 0.2–0.9)
Monocyte %: 4.5 %
Neutrophil #: 3.7 x10 3/mm (ref 1.4–6.5)
Neutrophil %: 61.6 %
Platelet: 188 x10 3/mm (ref 150–440)
RBC: 3.9 10*6/uL (ref 3.80–5.20)
RDW: 19.3 % — ABNORMAL HIGH (ref 11.5–14.5)
WBC: 6 x10 3/mm (ref 3.6–11.0)

## 2013-11-27 LAB — PHOSPHORUS: PHOSPHORUS: 3.8 mg/dL (ref 2.5–4.9)

## 2013-11-27 LAB — HEPATIC FUNCTION PANEL A (ARMC)
ALK PHOS: 122 U/L — AB
AST: 13 U/L — AB (ref 15–37)
Albumin: 2.9 g/dL — ABNORMAL LOW (ref 3.4–5.0)
Bilirubin, Direct: 0.1 mg/dL (ref 0.00–0.20)
Bilirubin,Total: 0.5 mg/dL (ref 0.2–1.0)
SGPT (ALT): 11 U/L — ABNORMAL LOW
Total Protein: 6.6 g/dL (ref 6.4–8.2)

## 2013-11-27 LAB — BASIC METABOLIC PANEL
Anion Gap: 6 — ABNORMAL LOW (ref 7–16)
BUN: 8 mg/dL (ref 7–18)
CHLORIDE: 104 mmol/L (ref 98–107)
Calcium, Total: 8.7 mg/dL (ref 8.5–10.1)
Co2: 29 mmol/L (ref 21–32)
Creatinine: 0.74 mg/dL (ref 0.60–1.30)
EGFR (Non-African Amer.): >60
Glucose: 77 mg/dL (ref 65–99)
OSMOLALITY: 275 (ref 275–301)
Potassium: 4.1 mmol/L (ref 3.5–5.1)
Sodium: 139 mmol/L (ref 136–145)

## 2013-11-27 LAB — MAGNESIUM: MAGNESIUM: 1.6 mg/dL — AB

## 2013-12-07 ENCOUNTER — Ambulatory Visit: Payer: Self-pay | Admitting: Internal Medicine

## 2013-12-11 ENCOUNTER — Ambulatory Visit: Payer: Self-pay

## 2013-12-11 LAB — HEPATIC FUNCTION PANEL A (ARMC)
ALK PHOS: 137 U/L — AB
Albumin: 3 g/dL — ABNORMAL LOW (ref 3.4–5.0)
Bilirubin, Direct: <0.1 mg/dL (ref 0.00–0.20)
Bilirubin,Total: 0.3 mg/dL (ref 0.2–1.0)
SGOT(AST): 19 U/L (ref 15–37)
SGPT (ALT): 12 U/L — ABNORMAL LOW
Total Protein: 6.8 g/dL (ref 6.4–8.2)

## 2013-12-11 LAB — CBC CANCER CENTER
Basophil #: 0.1 x10 3/mm (ref 0.0–0.1)
Basophil %: 1.2 %
EOS ABS: 0.1 x10 3/mm (ref 0.0–0.7)
Eosinophil %: 1.7 %
HCT: 33.1 % — ABNORMAL LOW (ref 35.0–47.0)
HGB: 10 g/dL — AB (ref 12.0–16.0)
Lymphocyte #: 2.1 x10 3/mm (ref 1.0–3.6)
Lymphocyte %: 42.1 %
MCH: 25.9 pg — ABNORMAL LOW (ref 26.0–34.0)
MCHC: 30.1 g/dL — ABNORMAL LOW (ref 32.0–36.0)
MCV: 86 fL (ref 80–100)
Monocyte #: 0.3 x10 3/mm (ref 0.2–0.9)
Monocyte %: 5.8 %
NEUTROS ABS: 2.4 x10 3/mm (ref 1.4–6.5)
Neutrophil %: 49.2 %
Platelet: 286 x10 3/mm (ref 150–440)
RBC: 3.85 10*6/uL (ref 3.80–5.20)
RDW: 20 % — ABNORMAL HIGH (ref 11.5–14.5)
WBC: 4.9 x10 3/mm (ref 3.6–11.0)

## 2013-12-11 LAB — CREATININE, SERUM
Creatinine: 0.78 mg/dL (ref 0.60–1.30)
EGFR (African American): >60
EGFR (Non-African Amer.): >60

## 2013-12-11 LAB — TSH: THYROID STIMULATING HORM: 1.64 u[IU]/mL

## 2013-12-18 LAB — CBC CANCER CENTER
Basophil #: 0 x10 3/mm (ref 0.0–0.1)
Basophil %: 0.5 %
EOS ABS: 0.1 x10 3/mm (ref 0.0–0.7)
EOS PCT: 2.5 %
HCT: 32.8 % — ABNORMAL LOW (ref 35.0–47.0)
HGB: 9.9 g/dL — ABNORMAL LOW (ref 12.0–16.0)
LYMPHS ABS: 2.7 x10 3/mm (ref 1.0–3.6)
Lymphocyte %: 45 %
MCH: 25.6 pg — AB (ref 26.0–34.0)
MCHC: 30.3 g/dL — ABNORMAL LOW (ref 32.0–36.0)
MCV: 84 fL (ref 80–100)
MONO ABS: 0.2 x10 3/mm (ref 0.2–0.9)
Monocyte %: 3.2 %
NEUTROS ABS: 2.9 x10 3/mm (ref 1.4–6.5)
Neutrophil %: 48.8 %
Platelet: 229 x10 3/mm (ref 150–440)
RBC: 3.89 10*6/uL (ref 3.80–5.20)
RDW: 20.2 % — ABNORMAL HIGH (ref 11.5–14.5)
WBC: 6 x10 3/mm (ref 3.6–11.0)

## 2013-12-18 LAB — BASIC METABOLIC PANEL
ANION GAP: 7 (ref 7–16)
BUN: 13 mg/dL (ref 7–18)
Calcium, Total: 8.5 mg/dL (ref 8.5–10.1)
Chloride: 105 mmol/L (ref 98–107)
Co2: 29 mmol/L (ref 21–32)
Creatinine: 0.81 mg/dL (ref 0.60–1.30)
EGFR (African American): >60
EGFR (Non-African Amer.): >60
Glucose: 76 mg/dL (ref 65–99)
Osmolality: 280 (ref 275–301)
Potassium: 3.7 mmol/L (ref 3.5–5.1)
Sodium: 141 mmol/L (ref 136–145)

## 2013-12-18 LAB — MAGNESIUM: MAGNESIUM: 1.7 mg/dL — AB

## 2013-12-18 LAB — HEPATIC FUNCTION PANEL A (ARMC)
ALK PHOS: 127 U/L — AB
Albumin: 3 g/dL — ABNORMAL LOW (ref 3.4–5.0)
BILIRUBIN TOTAL: 0.3 mg/dL (ref 0.2–1.0)
SGOT(AST): 14 U/L — ABNORMAL LOW (ref 15–37)
SGPT (ALT): 12 U/L — ABNORMAL LOW
TOTAL PROTEIN: 6.4 g/dL (ref 6.4–8.2)

## 2013-12-18 LAB — PHOSPHORUS: Phosphorus: 4.5 mg/dL (ref 2.5–4.9)

## 2013-12-27 ENCOUNTER — Emergency Department: Payer: Self-pay | Admitting: Emergency Medicine

## 2014-01-01 LAB — COMPREHENSIVE METABOLIC PANEL
ALK PHOS: 120 U/L — AB
ANION GAP: 9 (ref 7–16)
AST: 13 U/L — AB (ref 15–37)
Albumin: 3.1 g/dL — ABNORMAL LOW (ref 3.4–5.0)
BILIRUBIN TOTAL: 0.3 mg/dL (ref 0.2–1.0)
BUN: 10 mg/dL (ref 7–18)
CREATININE: 0.7 mg/dL (ref 0.60–1.30)
Calcium, Total: 8.3 mg/dL — ABNORMAL LOW (ref 8.5–10.1)
Chloride: 104 mmol/L (ref 98–107)
Co2: 28 mmol/L (ref 21–32)
EGFR (African American): >60
EGFR (Non-African Amer.): >60
GLUCOSE: 92 mg/dL (ref 65–99)
Osmolality: 280 (ref 275–301)
Potassium: 4.5 mmol/L (ref 3.5–5.1)
SGPT (ALT): 12 U/L — ABNORMAL LOW
Sodium: 141 mmol/L (ref 136–145)
Total Protein: 6.5 g/dL (ref 6.4–8.2)

## 2014-01-01 LAB — CBC CANCER CENTER
Basophil #: 0.1 x10 3/mm (ref 0.0–0.1)
Basophil %: 1.1 %
Eosinophil #: 0.1 x10 3/mm (ref 0.0–0.7)
Eosinophil %: 1.2 %
HCT: 33.6 % — ABNORMAL LOW (ref 35.0–47.0)
HGB: 10.3 g/dL — ABNORMAL LOW (ref 12.0–16.0)
Lymphocyte #: 1.7 x10 3/mm (ref 1.0–3.6)
Lymphocyte %: 35.2 %
MCH: 25.6 pg — ABNORMAL LOW (ref 26.0–34.0)
MCHC: 30.6 g/dL — ABNORMAL LOW (ref 32.0–36.0)
MCV: 84 fL (ref 80–100)
Monocyte #: 0.5 x10 3/mm (ref 0.2–0.9)
Monocyte %: 10.7 %
Neutrophil #: 2.5 x10 3/mm (ref 1.4–6.5)
Neutrophil %: 51.8 %
Platelet: 290 x10 3/mm (ref 150–440)
RBC: 4.01 10*6/uL (ref 3.80–5.20)
RDW: 20.5 % — ABNORMAL HIGH (ref 11.5–14.5)
WBC: 4.9 x10 3/mm (ref 3.6–11.0)

## 2014-01-01 LAB — MAGNESIUM: MAGNESIUM: 1.6 mg/dL — AB

## 2014-01-07 ENCOUNTER — Ambulatory Visit: Payer: Self-pay | Admitting: Internal Medicine

## 2014-01-08 LAB — CBC CANCER CENTER
BASOS PCT: 1.1 %
Basophil #: 0.1 x10 3/mm (ref 0.0–0.1)
Eosinophil #: 0.1 x10 3/mm (ref 0.0–0.7)
Eosinophil %: 0.8 %
HCT: 32.7 % — ABNORMAL LOW (ref 35.0–47.0)
HGB: 10 g/dL — ABNORMAL LOW (ref 12.0–16.0)
Lymphocyte #: 3.1 x10 3/mm (ref 1.0–3.6)
Lymphocyte %: 38.8 %
MCH: 25.5 pg — ABNORMAL LOW (ref 26.0–34.0)
MCHC: 30.6 g/dL — ABNORMAL LOW (ref 32.0–36.0)
MCV: 83 fL (ref 80–100)
MONO ABS: 0.4 x10 3/mm (ref 0.2–0.9)
MONOS PCT: 4.4 %
Neutrophil #: 4.5 x10 3/mm (ref 1.4–6.5)
Neutrophil %: 54.9 %
Platelet: 273 x10 3/mm (ref 150–440)
RBC: 3.94 10*6/uL (ref 3.80–5.20)
RDW: 20.1 % — ABNORMAL HIGH (ref 11.5–14.5)
WBC: 8.1 x10 3/mm (ref 3.6–11.0)

## 2014-01-08 LAB — PHOSPHORUS: PHOSPHORUS: 3.9 mg/dL (ref 2.5–4.9)

## 2014-01-22 LAB — HEPATIC FUNCTION PANEL A (ARMC)
ALK PHOS: 118 U/L — AB
AST: 15 U/L (ref 15–37)
Albumin: 2.9 g/dL — ABNORMAL LOW (ref 3.4–5.0)
BILIRUBIN DIRECT: 0.1 mg/dL (ref 0.00–0.20)
Bilirubin,Total: 0.3 mg/dL (ref 0.2–1.0)
SGPT (ALT): 13 U/L — ABNORMAL LOW
Total Protein: 6.2 g/dL — ABNORMAL LOW (ref 6.4–8.2)

## 2014-01-22 LAB — CBC CANCER CENTER
BASOS PCT: 1.8 %
Basophil #: 0.1 x10 3/mm (ref 0.0–0.1)
EOS ABS: 0 x10 3/mm (ref 0.0–0.7)
Eosinophil %: 1.3 %
HCT: 32 % — ABNORMAL LOW (ref 35.0–47.0)
HGB: 9.8 g/dL — AB (ref 12.0–16.0)
Lymphocyte #: 1.8 x10 3/mm (ref 1.0–3.6)
Lymphocyte %: 55.8 %
MCH: 26 pg (ref 26.0–34.0)
MCHC: 30.6 g/dL — AB (ref 32.0–36.0)
MCV: 85 fL (ref 80–100)
Monocyte #: 0.4 x10 3/mm (ref 0.2–0.9)
Monocyte %: 11.4 %
Neutrophil #: 0.9 x10 3/mm — ABNORMAL LOW (ref 1.4–6.5)
Neutrophil %: 29.7 %
Platelet: 261 x10 3/mm (ref 150–440)
RBC: 3.77 10*6/uL — ABNORMAL LOW (ref 3.80–5.20)
RDW: 21.7 % — ABNORMAL HIGH (ref 11.5–14.5)
WBC: 3.2 x10 3/mm — ABNORMAL LOW (ref 3.6–11.0)

## 2014-01-22 LAB — MAGNESIUM: Magnesium: 1.5 mg/dL — ABNORMAL LOW

## 2014-01-22 LAB — CREATININE, SERUM
Creatinine: 0.88 mg/dL (ref 0.60–1.30)
EGFR (African American): >60

## 2014-01-23 LAB — CANCER ANTIGEN 27.29: CA 27.29: 123.6 U/mL — ABNORMAL HIGH (ref 0.0–38.6)

## 2014-01-29 LAB — CREATININE, SERUM
Creatinine: 0.87 mg/dL (ref 0.60–1.30)
EGFR (Non-African Amer.): >60

## 2014-01-29 LAB — CBC CANCER CENTER
Basophil #: 0.1 x10 3/mm (ref 0.0–0.1)
Basophil %: 1.1 %
EOS PCT: 1.7 %
Eosinophil #: 0.1 x10 3/mm (ref 0.0–0.7)
HCT: 33.1 % — ABNORMAL LOW (ref 35.0–47.0)
HGB: 10.1 g/dL — ABNORMAL LOW (ref 12.0–16.0)
Lymphocyte #: 1.4 x10 3/mm (ref 1.0–3.6)
Lymphocyte %: 27.4 %
MCH: 25.8 pg — AB (ref 26.0–34.0)
MCHC: 30.5 g/dL — AB (ref 32.0–36.0)
MCV: 85 fL (ref 80–100)
MONOS PCT: 8.7 %
Monocyte #: 0.5 x10 3/mm (ref 0.2–0.9)
Neutrophil #: 3.2 x10 3/mm (ref 1.4–6.5)
Neutrophil %: 61.1 %
Platelet: 222 x10 3/mm (ref 150–440)
RBC: 3.91 10*6/uL (ref 3.80–5.20)
RDW: 21.1 % — ABNORMAL HIGH (ref 11.5–14.5)
WBC: 5.2 x10 3/mm (ref 3.6–11.0)

## 2014-01-29 LAB — HEPATIC FUNCTION PANEL A (ARMC)
ALBUMIN: 3.1 g/dL — AB (ref 3.4–5.0)
ALK PHOS: 118 U/L — AB
Bilirubin, Direct: 0.1 mg/dL (ref 0.00–0.20)
Bilirubin,Total: 0.4 mg/dL (ref 0.2–1.0)
SGOT(AST): 16 U/L (ref 15–37)
SGPT (ALT): 14 U/L
TOTAL PROTEIN: 6.3 g/dL — AB (ref 6.4–8.2)

## 2014-01-29 LAB — MAGNESIUM: Magnesium: 1.8 mg/dL

## 2014-02-05 ENCOUNTER — Inpatient Hospital Stay: Payer: Self-pay | Admitting: Internal Medicine

## 2014-02-05 LAB — URINALYSIS, COMPLETE
BILIRUBIN, UR: NEGATIVE
Blood: NEGATIVE
Glucose,UR: NEGATIVE mg/dL (ref 0–75)
Ketone: NEGATIVE
NITRITE: POSITIVE
PROTEIN: NEGATIVE
Ph: 6 (ref 4.5–8.0)
Specific Gravity: 1.014 (ref 1.003–1.030)
WBC UR: 34 /HPF (ref 0–5)

## 2014-02-05 LAB — CBC
HCT: 31 % — ABNORMAL LOW (ref 35.0–47.0)
HGB: 9.6 g/dL — ABNORMAL LOW (ref 12.0–16.0)
MCH: 26.4 pg (ref 26.0–34.0)
MCHC: 31 g/dL — ABNORMAL LOW (ref 32.0–36.0)
MCV: 85 fL (ref 80–100)
PLATELETS: 262 10*3/uL (ref 150–440)
RBC: 3.63 10*6/uL — ABNORMAL LOW (ref 3.80–5.20)
RDW: 20.8 % — AB (ref 11.5–14.5)
WBC: 5.7 10*3/uL (ref 3.6–11.0)

## 2014-02-05 LAB — COMPREHENSIVE METABOLIC PANEL WITH GFR
Albumin: 2.9 g/dL — ABNORMAL LOW
Alkaline Phosphatase: 93 U/L
Anion Gap: 6 — ABNORMAL LOW
BUN: 11 mg/dL
Bilirubin,Total: 0.5 mg/dL
Calcium, Total: 8.6 mg/dL
Chloride: 104 mmol/L
Co2: 31 mmol/L
Creatinine: 0.68 mg/dL
EGFR (African American): >60
EGFR (Non-African Amer.): >60
Glucose: 70 mg/dL
Osmolality: 279
Potassium: 3.4 mmol/L — ABNORMAL LOW
SGOT(AST): 12 U/L — ABNORMAL LOW
SGPT (ALT): 10 U/L — ABNORMAL LOW
Sodium: 141 mmol/L
Total Protein: 6.2 g/dL — ABNORMAL LOW

## 2014-02-05 LAB — TROPONIN I: Troponin-I: <0.02 ng/mL

## 2014-02-06 ENCOUNTER — Ambulatory Visit: Payer: Self-pay | Admitting: Internal Medicine

## 2014-02-06 LAB — CBC WITH DIFFERENTIAL/PLATELET
BASOS PCT: 0.2 %
Basophil #: 0 10*3/uL (ref 0.0–0.1)
EOS ABS: 0 10*3/uL (ref 0.0–0.7)
Eosinophil %: 0 %
HCT: 31.3 % — ABNORMAL LOW (ref 35.0–47.0)
HGB: 9.7 g/dL — AB (ref 12.0–16.0)
Lymphocyte #: 0.4 10*3/uL — ABNORMAL LOW (ref 1.0–3.6)
Lymphocyte %: 8.3 %
MCH: 26.7 pg (ref 26.0–34.0)
MCHC: 30.9 g/dL — AB (ref 32.0–36.0)
MCV: 86 fL (ref 80–100)
Monocyte #: 0 x10 3/mm — ABNORMAL LOW (ref 0.2–0.9)
Monocyte %: 0.8 %
Neutrophil #: 4.2 10*3/uL (ref 1.4–6.5)
Neutrophil %: 90.7 %
Platelet: 273 10*3/uL (ref 150–440)
RBC: 3.63 10*6/uL — AB (ref 3.80–5.20)
RDW: 20.5 % — AB (ref 11.5–14.5)
WBC: 4.6 10*3/uL (ref 3.6–11.0)

## 2014-02-06 LAB — BASIC METABOLIC PANEL
Anion Gap: 5 — ABNORMAL LOW (ref 7–16)
BUN: 10 mg/dL (ref 7–18)
CHLORIDE: 106 mmol/L (ref 98–107)
CO2: 30 mmol/L (ref 21–32)
CREATININE: 0.84 mg/dL (ref 0.60–1.30)
Calcium, Total: 8.5 mg/dL (ref 8.5–10.1)
EGFR (African American): >60
EGFR (Non-African Amer.): >60
Glucose: 169 mg/dL — ABNORMAL HIGH (ref 65–99)
Osmolality: 284 (ref 275–301)
Potassium: 3.6 mmol/L (ref 3.5–5.1)
SODIUM: 141 mmol/L (ref 136–145)

## 2014-02-08 ENCOUNTER — Ambulatory Visit: Payer: Self-pay | Admitting: Internal Medicine

## 2014-02-19 LAB — CREATININE, SERUM
Creatinine: 0.87 mg/dL (ref 0.60–1.30)
EGFR (African American): >60

## 2014-02-19 LAB — CBC CANCER CENTER
BASOS ABS: 0.1 x10 3/mm (ref 0.0–0.1)
BASOS PCT: 0.7 %
EOS ABS: 0.1 x10 3/mm (ref 0.0–0.7)
Eosinophil %: 1 %
HCT: 32.5 % — ABNORMAL LOW (ref 35.0–47.0)
HGB: 9.8 g/dL — AB (ref 12.0–16.0)
Lymphocyte #: 2 x10 3/mm (ref 1.0–3.6)
Lymphocyte %: 23 %
MCH: 26.3 pg (ref 26.0–34.0)
MCHC: 30.3 g/dL — ABNORMAL LOW (ref 32.0–36.0)
MCV: 87 fL (ref 80–100)
MONO ABS: 0.6 x10 3/mm (ref 0.2–0.9)
Monocyte %: 6.4 %
Neutrophil #: 6.1 x10 3/mm (ref 1.4–6.5)
Neutrophil %: 68.9 %
Platelet: 330 x10 3/mm (ref 150–440)
RBC: 3.74 10*6/uL — ABNORMAL LOW (ref 3.80–5.20)
RDW: 22.2 % — AB (ref 11.5–14.5)
WBC: 8.8 x10 3/mm (ref 3.6–11.0)

## 2014-02-19 LAB — HEPATIC FUNCTION PANEL A (ARMC)
ALBUMIN: 3 g/dL — AB (ref 3.4–5.0)
ALK PHOS: 101 U/L
BILIRUBIN DIRECT: 0.1 mg/dL (ref 0.00–0.20)
Bilirubin,Total: 0.3 mg/dL (ref 0.2–1.0)
SGOT(AST): 12 U/L — ABNORMAL LOW (ref 15–37)
SGPT (ALT): 15 U/L
TOTAL PROTEIN: 6.1 g/dL — AB (ref 6.4–8.2)

## 2014-02-19 LAB — MAGNESIUM: Magnesium: 1.8 mg/dL

## 2014-02-26 LAB — COMPREHENSIVE METABOLIC PANEL
ALT: 13 U/L — AB
AST: 15 U/L (ref 15–37)
Albumin: 2.9 g/dL — ABNORMAL LOW (ref 3.4–5.0)
Alkaline Phosphatase: 94 U/L
Anion Gap: 2 — ABNORMAL LOW (ref 7–16)
BILIRUBIN TOTAL: 0.4 mg/dL (ref 0.2–1.0)
BUN: 15 mg/dL (ref 7–18)
CALCIUM: 8.8 mg/dL (ref 8.5–10.1)
CHLORIDE: 106 mmol/L (ref 98–107)
CO2: 32 mmol/L (ref 21–32)
CREATININE: 0.84 mg/dL (ref 0.60–1.30)
EGFR (African American): >60
EGFR (Non-African Amer.): >60
GLUCOSE: 75 mg/dL (ref 65–99)
Osmolality: 279 (ref 275–301)
Potassium: 4.3 mmol/L (ref 3.5–5.1)
SODIUM: 140 mmol/L (ref 136–145)
Total Protein: 6.1 g/dL — ABNORMAL LOW (ref 6.4–8.2)

## 2014-02-26 LAB — CBC CANCER CENTER
BASOS ABS: 0.1 x10 3/mm (ref 0.0–0.1)
Basophil %: 0.9 %
Eosinophil #: 0.3 x10 3/mm (ref 0.0–0.7)
Eosinophil %: 4.3 %
HCT: 30.9 % — ABNORMAL LOW (ref 35.0–47.0)
HGB: 9.5 g/dL — ABNORMAL LOW (ref 12.0–16.0)
LYMPHS ABS: 2 x10 3/mm (ref 1.0–3.6)
Lymphocyte %: 29.8 %
MCH: 26.8 pg (ref 26.0–34.0)
MCHC: 30.7 g/dL — AB (ref 32.0–36.0)
MCV: 87 fL (ref 80–100)
MONO ABS: 0.4 x10 3/mm (ref 0.2–0.9)
Monocyte %: 5.6 %
NEUTROS ABS: 4 x10 3/mm (ref 1.4–6.5)
Neutrophil %: 59.4 %
Platelet: 228 x10 3/mm (ref 150–440)
RBC: 3.54 10*6/uL — AB (ref 3.80–5.20)
RDW: 21.6 % — ABNORMAL HIGH (ref 11.5–14.5)
WBC: 6.8 x10 3/mm (ref 3.6–11.0)

## 2014-02-26 LAB — MAGNESIUM: MAGNESIUM: 1.8 mg/dL

## 2014-03-09 ENCOUNTER — Ambulatory Visit: Payer: Self-pay | Admitting: Internal Medicine

## 2014-03-12 LAB — CBC CANCER CENTER
BASOS ABS: 0.1 x10 3/mm (ref 0.0–0.1)
Basophil %: 1.3 %
Eosinophil #: 0.1 x10 3/mm (ref 0.0–0.7)
Eosinophil %: 1.2 %
HCT: 33.6 % — ABNORMAL LOW (ref 35.0–47.0)
HGB: 10.3 g/dL — ABNORMAL LOW (ref 12.0–16.0)
Lymphocyte #: 2.1 x10 3/mm (ref 1.0–3.6)
Lymphocyte %: 39.8 %
MCH: 27.2 pg (ref 26.0–34.0)
MCHC: 30.6 g/dL — AB (ref 32.0–36.0)
MCV: 89 fL (ref 80–100)
Monocyte #: 0.6 x10 3/mm (ref 0.2–0.9)
Monocyte %: 11.2 %
Neutrophil #: 2.4 x10 3/mm (ref 1.4–6.5)
Neutrophil %: 46.5 %
Platelet: 327 x10 3/mm (ref 150–440)
RBC: 3.77 10*6/uL — AB (ref 3.80–5.20)
RDW: 20.7 % — ABNORMAL HIGH (ref 11.5–14.5)
WBC: 5.2 x10 3/mm (ref 3.6–11.0)

## 2014-03-12 LAB — HEPATIC FUNCTION PANEL A (ARMC)
ALBUMIN: 3 g/dL — AB (ref 3.4–5.0)
ALT: 11 U/L — AB
Alkaline Phosphatase: 121 U/L — ABNORMAL HIGH
Bilirubin, Direct: 0.1 mg/dL (ref 0.0–0.2)
Bilirubin,Total: 0.3 mg/dL (ref 0.2–1.0)
SGOT(AST): 14 U/L — ABNORMAL LOW (ref 15–37)
Total Protein: 6.2 g/dL — ABNORMAL LOW (ref 6.4–8.2)

## 2014-03-12 LAB — CREATININE, SERUM
Creatinine: 0.72 mg/dL (ref 0.60–1.30)
EGFR (African American): >60
EGFR (Non-African Amer.): >60

## 2014-03-12 LAB — MAGNESIUM: Magnesium: 1.7 mg/dL — ABNORMAL LOW

## 2014-03-24 LAB — CBC CANCER CENTER
BASOS ABS: 0.1 x10 3/mm (ref 0.0–0.1)
Basophil %: 0.8 %
Eosinophil #: 0.1 x10 3/mm (ref 0.0–0.7)
Eosinophil %: 0.9 %
HCT: 32.5 % — ABNORMAL LOW (ref 35.0–47.0)
HGB: 10.3 g/dL — ABNORMAL LOW (ref 12.0–16.0)
Lymphocyte #: 2.1 x10 3/mm (ref 1.0–3.6)
Lymphocyte %: 32.1 %
MCH: 28 pg (ref 26.0–34.0)
MCHC: 31.6 g/dL — ABNORMAL LOW (ref 32.0–36.0)
MCV: 88 fL (ref 80–100)
MONO ABS: 0.5 x10 3/mm (ref 0.2–0.9)
Monocyte %: 7.5 %
NEUTROS ABS: 3.8 x10 3/mm (ref 1.4–6.5)
NEUTROS PCT: 58.7 %
PLATELETS: 254 x10 3/mm (ref 150–440)
RBC: 3.68 10*6/uL — ABNORMAL LOW (ref 3.80–5.20)
RDW: 20.8 % — ABNORMAL HIGH (ref 11.5–14.5)
WBC: 6.5 x10 3/mm (ref 3.6–11.0)

## 2014-04-08 ENCOUNTER — Ambulatory Visit: Payer: Self-pay | Admitting: Internal Medicine

## 2014-04-14 LAB — HEPATIC FUNCTION PANEL A (ARMC)
ALK PHOS: 108 U/L
ALT: 11 U/L — AB
Albumin: 2.9 g/dL — ABNORMAL LOW (ref 3.4–5.0)
Bilirubin, Direct: 0.1 mg/dL (ref 0.0–0.2)
Bilirubin,Total: 0.2 mg/dL (ref 0.2–1.0)
SGOT(AST): 11 U/L — ABNORMAL LOW (ref 15–37)
Total Protein: 6.3 g/dL — ABNORMAL LOW (ref 6.4–8.2)

## 2014-04-14 LAB — CBC CANCER CENTER
BASOS ABS: 0.1 x10 3/mm (ref 0.0–0.1)
BASOS PCT: 0.7 %
Eosinophil #: 0.1 x10 3/mm (ref 0.0–0.7)
Eosinophil %: 0.9 %
HCT: 34 % — ABNORMAL LOW (ref 35.0–47.0)
HGB: 10.4 g/dL — ABNORMAL LOW (ref 12.0–16.0)
Lymphocyte #: 1.9 x10 3/mm (ref 1.0–3.6)
Lymphocyte %: 19.9 %
MCH: 27.9 pg (ref 26.0–34.0)
MCHC: 30.7 g/dL — ABNORMAL LOW (ref 32.0–36.0)
MCV: 91 fL (ref 80–100)
MONOS PCT: 5.4 %
Monocyte #: 0.5 x10 3/mm (ref 0.2–0.9)
NEUTROS ABS: 6.8 x10 3/mm — AB (ref 1.4–6.5)
Neutrophil %: 73.1 %
Platelet: 310 x10 3/mm (ref 150–440)
RBC: 3.74 10*6/uL — ABNORMAL LOW (ref 3.80–5.20)
RDW: 20.4 % — AB (ref 11.5–14.5)
WBC: 9.3 x10 3/mm (ref 3.6–11.0)

## 2014-04-14 LAB — MAGNESIUM: Magnesium: 1.6 mg/dL — ABNORMAL LOW

## 2014-04-14 LAB — CREATININE, SERUM
CREATININE: 0.73 mg/dL (ref 0.60–1.30)
EGFR (Non-African Amer.): >60

## 2014-04-23 LAB — CBC CANCER CENTER
BASOS PCT: 0.8 %
Basophil #: 0.1 x10 3/mm (ref 0.0–0.1)
Eosinophil #: 0.2 x10 3/mm (ref 0.0–0.7)
Eosinophil %: 3.3 %
HCT: 32.2 % — ABNORMAL LOW (ref 35.0–47.0)
HGB: 9.9 g/dL — ABNORMAL LOW (ref 12.0–16.0)
LYMPHS ABS: 2.1 x10 3/mm (ref 1.0–3.6)
Lymphocyte %: 28.8 %
MCH: 27.9 pg (ref 26.0–34.0)
MCHC: 30.8 g/dL — ABNORMAL LOW (ref 32.0–36.0)
MCV: 91 fL (ref 80–100)
MONO ABS: 0.3 x10 3/mm (ref 0.2–0.9)
Monocyte %: 4.7 %
NEUTROS ABS: 4.6 x10 3/mm (ref 1.4–6.5)
Neutrophil %: 62.4 %
PLATELETS: 292 x10 3/mm (ref 150–440)
RBC: 3.55 10*6/uL — ABNORMAL LOW (ref 3.80–5.20)
RDW: 20.8 % — ABNORMAL HIGH (ref 11.5–14.5)
WBC: 7.4 x10 3/mm (ref 3.6–11.0)

## 2014-05-09 ENCOUNTER — Ambulatory Visit: Payer: Self-pay | Admitting: Internal Medicine

## 2014-05-12 LAB — CBC CANCER CENTER
Basophil #: 0.1 x10 3/mm (ref 0.0–0.1)
Basophil %: 1.1 %
Eosinophil #: 0.1 x10 3/mm (ref 0.0–0.7)
Eosinophil %: 1.1 %
HCT: 34.1 % — ABNORMAL LOW (ref 35.0–47.0)
HGB: 10.4 g/dL — ABNORMAL LOW (ref 12.0–16.0)
Lymphocyte #: 2 x10 3/mm (ref 1.0–3.6)
Lymphocyte %: 34.4 %
MCH: 27.9 pg (ref 26.0–34.0)
MCHC: 30.6 g/dL — ABNORMAL LOW (ref 32.0–36.0)
MCV: 91 fL (ref 80–100)
MONO ABS: 0.3 x10 3/mm (ref 0.2–0.9)
Monocyte %: 4.6 %
Neutrophil #: 3.4 x10 3/mm (ref 1.4–6.5)
Neutrophil %: 58.8 %
PLATELETS: 394 x10 3/mm (ref 150–440)
RBC: 3.74 10*6/uL — ABNORMAL LOW (ref 3.80–5.20)
RDW: 19.6 % — AB (ref 11.5–14.5)
WBC: 5.9 x10 3/mm (ref 3.6–11.0)

## 2014-05-12 LAB — HEPATIC FUNCTION PANEL A (ARMC)
ALT: 13 U/L — AB
Albumin: 2.7 g/dL — ABNORMAL LOW (ref 3.4–5.0)
Alkaline Phosphatase: 105 U/L
Bilirubin, Direct: 0.1 mg/dL (ref 0.0–0.2)
Bilirubin,Total: 0.1 mg/dL — ABNORMAL LOW (ref 0.2–1.0)
SGOT(AST): 14 U/L — ABNORMAL LOW (ref 15–37)
TOTAL PROTEIN: 6.3 g/dL — AB (ref 6.4–8.2)

## 2014-05-12 LAB — CREATININE, SERUM
CREATININE: 0.78 mg/dL (ref 0.60–1.30)
EGFR (African American): >60
EGFR (Non-African Amer.): >60

## 2014-05-12 LAB — MAGNESIUM: MAGNESIUM: 1.7 mg/dL — AB

## 2014-06-02 LAB — MAGNESIUM: Magnesium: 1.6 mg/dL — ABNORMAL LOW

## 2014-06-02 LAB — CBC CANCER CENTER
BASOS ABS: 0.1 x10 3/mm (ref 0.0–0.1)
Basophil %: 1.3 %
EOS ABS: 0.1 x10 3/mm (ref 0.0–0.7)
Eosinophil %: 1.1 %
HCT: 33.9 % — AB (ref 35.0–47.0)
HGB: 10.5 g/dL — ABNORMAL LOW (ref 12.0–16.0)
LYMPHS ABS: 2.2 x10 3/mm (ref 1.0–3.6)
Lymphocyte %: 34.6 %
MCH: 28.2 pg (ref 26.0–34.0)
MCHC: 30.9 g/dL — AB (ref 32.0–36.0)
MCV: 91 fL (ref 80–100)
MONO ABS: 0.4 x10 3/mm (ref 0.2–0.9)
Monocyte %: 7.1 %
NEUTROS ABS: 3.5 x10 3/mm (ref 1.4–6.5)
Neutrophil %: 55.9 %
PLATELETS: 305 x10 3/mm (ref 150–440)
RBC: 3.72 10*6/uL — ABNORMAL LOW (ref 3.80–5.20)
RDW: 19.2 % — AB (ref 11.5–14.5)
WBC: 6.2 x10 3/mm (ref 3.6–11.0)

## 2014-06-02 LAB — CREATININE, SERUM
Creatinine: 0.77 mg/dL (ref 0.60–1.30)
EGFR (African American): >60
EGFR (Non-African Amer.): >60

## 2014-06-02 LAB — HEPATIC FUNCTION PANEL A (ARMC)
ALT: 10 U/L — AB
AST: 13 U/L — AB (ref 15–37)
Albumin: 3.1 g/dL — ABNORMAL LOW (ref 3.4–5.0)
Alkaline Phosphatase: 107 U/L
Bilirubin, Direct: 0.1 mg/dL (ref 0.0–0.2)
Bilirubin,Total: 0.3 mg/dL (ref 0.2–1.0)
Total Protein: 6.4 g/dL (ref 6.4–8.2)

## 2014-06-05 ENCOUNTER — Emergency Department: Payer: Self-pay | Admitting: Emergency Medicine

## 2014-06-05 LAB — CBC WITH DIFFERENTIAL/PLATELET
BASOS PCT: 1.1 %
Basophil #: 0.1 10*3/uL (ref 0.0–0.1)
EOS ABS: 0.1 10*3/uL (ref 0.0–0.7)
Eosinophil %: 2.3 %
HCT: 34.2 % — ABNORMAL LOW (ref 35.0–47.0)
HGB: 10.8 g/dL — AB (ref 12.0–16.0)
LYMPHS PCT: 38.3 %
Lymphocyte #: 2.3 10*3/uL (ref 1.0–3.6)
MCH: 29.3 pg (ref 26.0–34.0)
MCHC: 31.6 g/dL — ABNORMAL LOW (ref 32.0–36.0)
MCV: 93 fL (ref 80–100)
Monocyte #: 0.4 x10 3/mm (ref 0.2–0.9)
Monocyte %: 6.3 %
NEUTROS PCT: 52 %
Neutrophil #: 3.1 10*3/uL (ref 1.4–6.5)
Platelet: 241 10*3/uL (ref 150–440)
RBC: 3.68 10*6/uL — ABNORMAL LOW (ref 3.80–5.20)
RDW: 18.7 % — ABNORMAL HIGH (ref 11.5–14.5)
WBC: 5.9 10*3/uL (ref 3.6–11.0)

## 2014-06-05 LAB — URINALYSIS, COMPLETE
BILIRUBIN, UR: NEGATIVE
Bacteria: NONE SEEN
Blood: NEGATIVE
Glucose,UR: NEGATIVE mg/dL (ref 0–75)
Ketone: NEGATIVE
LEUKOCYTE ESTERASE: NEGATIVE
Nitrite: NEGATIVE
Ph: 5 (ref 4.5–8.0)
Protein: NEGATIVE
RBC,UR: <1 /HPF (ref 0–5)
Specific Gravity: 1.013 (ref 1.003–1.030)
WBC UR: <1 /HPF (ref 0–5)

## 2014-06-05 LAB — BASIC METABOLIC PANEL
Anion Gap: 3 — ABNORMAL LOW (ref 7–16)
BUN: 13 mg/dL (ref 7–18)
Calcium, Total: 8.6 mg/dL (ref 8.5–10.1)
Chloride: 106 mmol/L (ref 98–107)
Co2: 31 mmol/L (ref 21–32)
Creatinine: 0.78 mg/dL (ref 0.60–1.30)
EGFR (African American): >60
EGFR (Non-African Amer.): >60
Glucose: 75 mg/dL (ref 65–99)
OSMOLALITY: 278 (ref 275–301)
Potassium: 4.1 mmol/L (ref 3.5–5.1)
Sodium: 140 mmol/L (ref 136–145)

## 2014-06-09 ENCOUNTER — Ambulatory Visit: Payer: Self-pay | Admitting: Internal Medicine

## 2014-06-13 LAB — CBC CANCER CENTER
BASOS PCT: 1.4 %
Basophil #: 0.1 x10 3/mm (ref 0.0–0.1)
EOS PCT: 3.1 %
Eosinophil #: 0.2 x10 3/mm (ref 0.0–0.7)
HCT: 33.4 % — ABNORMAL LOW (ref 35.0–47.0)
HGB: 10.5 g/dL — AB (ref 12.0–16.0)
LYMPHS PCT: 21 %
Lymphocyte #: 1.7 x10 3/mm (ref 1.0–3.6)
MCH: 29.1 pg (ref 26.0–34.0)
MCHC: 31.3 g/dL — ABNORMAL LOW (ref 32.0–36.0)
MCV: 93 fL (ref 80–100)
Monocyte #: 0.4 x10 3/mm (ref 0.2–0.9)
Monocyte %: 5.5 %
Neutrophil #: 5.4 x10 3/mm (ref 1.4–6.5)
Neutrophil %: 69 %
PLATELETS: 248 x10 3/mm (ref 150–440)
RBC: 3.6 10*6/uL — ABNORMAL LOW (ref 3.80–5.20)
RDW: 18.9 % — ABNORMAL HIGH (ref 11.5–14.5)
WBC: 7.9 x10 3/mm (ref 3.6–11.0)

## 2014-06-13 LAB — CREATININE, SERUM
CREATININE: 0.86 mg/dL (ref 0.60–1.30)
EGFR (Non-African Amer.): >60

## 2014-07-08 ENCOUNTER — Ambulatory Visit
Admit: 2014-07-08 | Disposition: A | Discharge status: Home / Self Care | Payer: Self-pay | Attending: Internal Medicine | Admitting: Internal Medicine

## 2014-08-01 ENCOUNTER — Ambulatory Visit: Payer: Self-pay | Admitting: Surgery

## 2014-08-18 ENCOUNTER — Ambulatory Visit
Admit: 2014-08-18 | Disposition: A | Discharge status: Home / Self Care | Payer: Self-pay | Attending: Internal Medicine | Admitting: Internal Medicine

## 2014-08-27 LAB — HEPATIC FUNCTION PANEL A (ARMC)
ALK PHOS: 94 U/L
ALT: 9 U/L — AB
Albumin: 3.9 g/dL
Bilirubin, Direct: <0.1 mg/dL
Bilirubin,Total: <0.1 mg/dL — ABNORMAL LOW
SGOT(AST): 17 U/L
Total Protein: 6.8 g/dL

## 2014-08-27 LAB — MAGNESIUM: MAGNESIUM: 1.7 mg/dL

## 2014-08-27 LAB — CBC CANCER CENTER
BASOS PCT: 1.2 %
Basophil #: 0.1 x10 3/mm (ref 0.0–0.1)
EOS ABS: 0.1 x10 3/mm (ref 0.0–0.7)
EOS PCT: 3.2 %
HCT: 35.6 % (ref 35.0–47.0)
HGB: 11.3 g/dL — ABNORMAL LOW (ref 12.0–16.0)
LYMPHS PCT: 40.5 %
Lymphocyte #: 1.8 x10 3/mm (ref 1.0–3.6)
MCH: 30 pg (ref 26.0–34.0)
MCHC: 31.8 g/dL — ABNORMAL LOW (ref 32.0–36.0)
MCV: 94 fL (ref 80–100)
MONOS PCT: 8.8 %
Monocyte #: 0.4 x10 3/mm (ref 0.2–0.9)
NEUTROS ABS: 2.1 x10 3/mm (ref 1.4–6.5)
NEUTROS PCT: 46.3 %
Platelet: 197 x10 3/mm (ref 150–440)
RBC: 3.78 10*6/uL — AB (ref 3.80–5.20)
RDW: 17.3 % — ABNORMAL HIGH (ref 11.5–14.5)
WBC: 4.5 x10 3/mm (ref 3.6–11.0)

## 2014-08-27 LAB — BASIC METABOLIC PANEL
Anion Gap: 4 — ABNORMAL LOW (ref 7–16)
BUN: 15 mg/dL
CALCIUM: 8.9 mg/dL
CHLORIDE: 106 mmol/L
Co2: 26 mmol/L
Creatinine: 0.71 mg/dL
EGFR (African American): >60
EGFR (Non-African Amer.): >60
Glucose: 117 mg/dL — ABNORMAL HIGH
POTASSIUM: 4.2 mmol/L
SODIUM: 136 mmol/L

## 2014-08-27 LAB — PHOSPHORUS: Phosphorus: 4.2 mg/dL

## 2014-08-29 NOTE — Consult Note (Signed)
Brief Consult Note: Diagnosis: Major depression.   Patient was seen by consultant.   Consult note dictated.   Recommend further assessment or treatment.   Orders entered.   Discussed with Attending MD.   Comments: Psychiatry: Patient seen. Very depressed and having suicidal thoughts. Also having to come off methadone. Needs inpt admission. I spoke with Ms Wall of infection control and confirmed pt can be aadmitted to [facility]. Orders done.  Electronic Signatures: Clapacs, Madie Reno (MD)  (Signed 31-Dec-14 13:48)  Authored: Brief Consult Note   Last Updated: 31-Dec-14 13:48 by Gonzella Lex (MD)

## 2014-08-29 NOTE — Discharge Summary (Signed)
PATIENT NAME:  Betty Edwards, Betty Edwards MR#:  [id number] DATE OF BIRTH:  Jun 22, 1945  PRIMARY CARE PHYSICIAN: Nonlocal.   DISCHARGE DIAGNOSES:  1.  Acute respiratory failure.  2.  Chronic obstructive pulmonary disease exacerbation.  3.  Coronary artery disease.  4.  Chronic pain syndrome.  5.  Tobacco abuse.   CONDITION: Stable.   CODE STATUS: FULL CODE.   HOME MEDICATIONS: Please refer to the [facility] physician discharge instruction and medication reconciliation list.   DIET: Low fat, low cholesterol diet.   ACTIVITY: As tolerated.   FOLLOWUP CARE: Follow with PCP within 1 to 2 weeks. The patient needs smoking cessation   REASON FOR ADMISSION: Shortness of breath, cough, decrease in mental status.   HOSPITAL COURSE: The patient is a 69 year old Caucasian female with a history of COPD, CAD, hypertension, tobacco abuse, who was sent from home due to altered mental status. The patient's O2 saturation dropped to 87, was placed on BiPAP in the ED. The patient complains of shortness of breath, cough. ABG showed pH of 7.28, pCO2 67.   For detailed history and physical examination, please refer to the admission note dictated by me. On admission date, the patient's chest x-ray showed minimal left lung base atelectasis versus early infiltrate. WBC 5.3, hemoglobin 10.9. BNP 1018. Troponin less than 0.02.   1.  The patient was admitted for acute respiratory failure due to COPD exacerbation. After admission, the patient was treated initially with BiPAP but changed to O2 by nasal cannula. The patient has been treated with IV Solu-Medrol, DuoNebs, and Spiriva. The patient's symptoms have much improved. She is off oxygen today. The physical examination shows lung sounds are clear. No wheezing, rales or crackles. The patient has no symptoms.   2.  For CAD, the patient has no chest pain. She has been treated with aspirin, beta blocker, and statin.   3.  The patient is clinically stable and will be discharged to home  today.   I discussed the patient's discharge plan with the patient, case manager and nurse.   TIME SPENT: About 36 minutes.    ____________________________ Demetrios Loll, MD qc:np D: 03/06/2013 17:00:54 ET T: 03/06/2013 19:38:20 ET JOB#: [id number]  cc: Demetrios Loll, MD, <Dictator> Demetrios Loll MD ELECTRONICALLY SIGNED 03/10/2013 12:23

## 2014-08-29 NOTE — Consult Note (Signed)
PATIENT NAME:  Betty Edwards, Betty Edwards MR#:  [id number] DATE OF BIRTH:  1945/10/14  DATE OF CONSULTATION:  06/08/2012  REFERRING PHYSICIAN:  Marlyce Huge, MD  CONSULTING PHYSICIAN:  Adelfo Diebel R. [name], MD  REASON FOR CONSULTATION: Known history of breast cancer, large ulcerating right chest wall lesion undergoing biopsy.   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, stage II a right breast cancer diagnosed mid February 2008 and underwent right mastectomy and node study and then got adjuvant chemotherapy with AC x 4 cycles, on hormonal therapy with Femara, a history of small left upper lobe lung nodule on observation, a right femur lesion on bone scan biopsied at [facility] March 2008 negative for malignancy, cholecystectomy, appendectomy, total abdominal hysterectomy with ovaries spared, a history of GI bleed in the past due to NSAID overuse, who has been admitted by surgeon for biopsy of right chest wall ulcerating lesion which has occurred in the last few weeks. Also, recent CA 27.29 level has been rising raising suspicion for recurrent/metastatic breast cancer. The patient had biopsy today, currently resting. Denies any major pain issues. She is very weak and oral intake is poor.   PAST MEDICAL HISTORY AND PAST SURGICAL HISTORY: As in HPI above.   FAMILY HISTORY: Mother had a history of uterine cancer, daughter with teratoma, denies ovarian or breast cancer in the family.   SOCIAL HISTORY: As in HPI. Ex-smoker x 30 years. Denies regular alcohol intake.   ALLERGIES INCLUDE: IBUPROFEN, PRILOSEC.   HOME MEDICATIONS: Abilify 2 mg p.o. at bedtime, enteric-coated aspirin 81 mg daily, atorvastatin 40 mg at bedtime, Cymbalta 60 mg daily, Duloxetine 60 mg delayed  release capsule daily, Femara 2.5 mg daily, Lipitor 40 mg at bedtime, Lyrica 100 mg b.i.d., methadone 86 mg daily, metoprolol 25 mg b.i.d., pantoprazole 40 mg q.a.m., Plavix 75 mg daily, Synthroid 50 mcg daily.   REVIEW OF SYSTEMS:  CONSTITUTIONAL: Generalized weakness, mostly nonambulatory. Oral intake poor. Denies fevers or chills.  HEENT: Denies headaches, dizziness at rest. No epistaxis, ear or jaw pain.  CARDIAC: Denies any angina at rest, orthopnea, or PND.  LUNGS: Has chronic dyspnea on activity. No progressive cough, sputum, or hemoptysis.  GASTROINTESTINAL: No nausea, vomiting, or diarrhea. Denies bright red blood in stools or melena.  GENITOURINARY: No dysuria or hematuria.  MUSCULOSKELETAL: Has chronic pains in her back, joints and right leg area, mostly unchanged.  EXTREMITIES: Has chronic wound in the right foot.  HEMATOLOGIC: Denies obvious bleeding symptoms.  SKIN: No new rashes or pruritus, has right chest wall large wound.  NEUROLOGICAL: Denies any new focal weakness, seizures, or loss of consciousness.  ENDOCRINE: No polyuria or polydipsia.   PHYSICAL EXAMINATION:  GENERAL: The patient is weak looking, poorly nourished individual, resting in bed, otherwise awake and oriented and converses appropriately. No acute distress at rest.  VITAL SIGNS: Temperature 97.4, pulse 73, at 16, 102/64, 96% on room air.  HEENT: Normocephalic, atraumatic. Extraocular movements intact. No oral thrush.  NECK: Negative for lymphadenopathy.  CARDIOVASCULAR: S1, S2, regular rate and rhythm.  LUNGS: Lungs show bilateral diminished breath sounds overall, no rhonchi.  EXTREMITIES: Chronic right leg edema, mild left leg edema, has a dressing over the right foot.  ABDOMEN: Soft, nontender. No hepatosplenomegaly clinically.  NEUROLOGIC: Limited examination. Cranial nerves seem intact. Moves all  extremities spontaneously.  CHEST: The right chest wall area shows large ulcerated lesion, underwent biopsy  today.   LABORATORY, DIAGNOSTIC, AND RADIOLOGICAL DATA: January 27th, creatinine 0.66, potassium 3.7, calcium 9.3, WBC 8100, hemoglobin 12, platelets 298.   IMPRESSION AND RECOMMENDATION: This is a 69 year old female with a history of multiple medical problems including stage IIA right breast cancer underwent mastectomy and chemotherapy in 2008, has been on hormonal therapy with Femara since then. More recently has had a rising CA 27.29 levels and has developed a large ulcerated lesion in the right chest wall area raising suspicion for recurrent/metastatic breast cancer. The patient has undergone biopsy of this right chest wall wound today, will await the pathology report. Also recommend pursuing PET scan evaluation given elevated CA 27.29 level for restaging of breast cancer to look for occult metastatic disease. Will follow up after above pathology report and PET scan is done and make further recommendations. The patient is agreeable to this plan. Will continue to follow.   Thank you for the referral. Please feel to contact me if any additional questions.    ____________________________ Rhett Bannister [name], MD srp:jm D: 06/09/2012 07:12:00 ET T: 06/09/2012 09:59:10 ET JOB#: [id number]  cc: Rickiya Picariello R. [name], MD, <Dictator> Alveta Heimlich MD ELECTRONICALLY SIGNED 06/11/2012 10:37

## 2014-08-29 NOTE — Op Note (Signed)
PATIENT NAME:  Betty Edwards, Betty Edwards MR#:  [id number] DATE OF BIRTH:  [birth year]  DATE OF PROCEDURE:  06/07/2012  ATTENDING PHYSICIAN:  Harrell Gave A. Shuaib Corsino, M.D.  ASSISTANT:  Bea Laura, PA-S.  PREOPERATIVE DIAGNOSIS:  Nonhealing right chest wound status post mastectomy for breast cancer.   POSTOPERATIVE DIAGNOSIS:  Nonhealing right chest wound status post mastectomy for breast cancer.   PROCEDURE PERFORMED:  Excision of nonhealing wound with placement of wound VAC apparatus, right chest wound.   ANESTHESIA: General.   ESTIMATED BLOOD LOSS: 20 mL.   SPECIMEN: Right nonhealing chest wound with a single suture denoting cephalad, double suture denoting lateral, and a suture at the base to reconstruct the deep area.   INDICATION FOR SURGERY:  The patient is a pleasant 69 year old female with history of breast cancer status post mastectomy. She, over the last few months, has developed a nonhealing wound at her right chest, which was initially thought to be due pressure, but needs to be concerning for recurrent breast cancer.   DETAILS OF PROCEDURE:  The patient was brought to the operating room suite after informed consent was obtained. She was induced, endotracheal tube was placed, and general anesthesia was administered. Her right chest was prepped and draped in a standard surgical fashion. A timeout was then performed correctly identifying the patient, operative site and procedure to be performed. I then outlined the area of nonhealing wound with a scalpel and excised it from the chest wall. I did not get wide margins, as this could possibly not be a recurrence but, however, since it was nonhealing, I did want to excise the area that was nonhealing to allow it to freshly heal.  After this, the specimen was labeled and excised. I then placed a wound VAC in the wound. The wound was a total of 6 x 4 cm plus approximately 1 to 2 cm deep. The VAC was then begun. It had good suction.  The patient was  awoken and brought to the postanesthesia care unit after extubation. There were no immediate complications. Needle, sponge and instrument counts were correct at the end of the procedure.     ____________________________ Glena Norfolk. Jasnoor Trussell, MD cal:dm D: 06/07/2012 11:55:07 ET T: 06/07/2012 12:16:17 ET JOB#: [id number]  cc: Harrell Gave A. Saumya Hukill, MD, <Dictator> Floyde Parkins MD ELECTRONICALLY SIGNED 06/09/2012 13:22

## 2014-08-29 NOTE — Discharge Summary (Signed)
PATIENT NAME:  Betty Edwards, ARAVE MR#:  [id number] DATE OF BIRTH:  22-Feb-1946  DATE OF ADMISSION:  06/07/2012 DATE OF DISCHARGE:  06/13/2012   DISCHARGE DIAGNOSIS: History of right breast cancer status post mastectomy, chemotherapy with nonhealing wound at incision.   PROCEDUR PERFORMED:  Excision of a nonhealing chest wound with placement of wound VAC.   PAST MEDICAL HISTORY:  1.  Hypothyroidism.  2.  History of myocardial infarction in 2009.  3.  History of substance abuse.  4.  History of osteomyelitis of toe.  5.  History of gastrointestinal bleed.  6.  History of diastolic dysfunction.  7.  History of chronic pain syndrome.  8.  History of attention deficit/hyperactivity disorder.  9.  History of hypertension.  10.  History of emphysema.  11.  History of hysterectomy, partial.  12.  History of back surgery.   DISCHARGE MEDICATIONS: 1.  Cymbalta 60 mg p.o. daily. 2.  Femara 2.5 mg p.o. daily. 3.  Synthroid 50 mcg p.o. daily.  4.  Abilify 10 mg p.o. at bedtime.  5.  Lipitor 40 mg p.o. at bedtime. 6.  Aspirin 81 p.o. daily. 7.  Lyrica 100 p.o. t.i.d.  8.  Atorvastatin 40 mg p.o. daily at bedtime.  9.  Protonix 40 mg p.o. daily.  10.  Duloxetine 60 mg p.o. daily.  11.  Methadone 86 mg p.o. daily. 12.  Percocet 1 tab p.o. q. 4 hours p.r.n. pain.   INDICATION FOR ADMISSION:  The patient is a pleasant 69 year old female with a history of right breast cancer status post mastectomy and chemotherapy. She has had increasingly enlarging right-sided nonhealing with her incision site. She was brought here for excision of nonhealing wound to send for pathology, placement of wound VAC.   HOSPITAL COURSE:  The patient underwent the above-mentioned procedure. Throughout her hospital course, she underwent physical therapy evaluation for discharge. She had a PET scan which showed possible  increasing right lung nodules concerning for metastatic disease. At time of discharge, her wound was  beginning to granulate well and was tolerating diet. Dressing change was controlled with p.o. narcotics.   DISCHARGE INSTRUCTIONS: The patient is to follow up with me in approximately 1 to 2 weeks. She is to continue to undergo dressing changes. She is to follow up with Dr. [name]  in approximately 1 week for discussion of possible metastases.  Her pathology came back as non-malignant. No need for further excision.     ____________________________ Glena Norfolk Menashe Kafer, MD cal:cc D: 06/12/2012 17:01:00 ET T: 06/12/2012 17:58:37 ET JOB#: [id number]  cc: Harrell Gave A. Zahriyah Joo, MD, <Dictator> Floyde Parkins MD ELECTRONICALLY SIGNED 06/17/2012 9:57

## 2014-08-29 NOTE — H&P (Signed)
PATIENT NAME:  Betty Edwards, Betty Edwards MR#:  [id number] DATE OF BIRTH:  1946-02-09  DATE OF ADMISSION:  03/04/2013  PRIMARY CARE PHYSICIAN: None local.  REFERRING PHYSICIAN: Dr. Jimmye Norman.  CHIEF COMPLAINT: Shortness of breath, cough, decreased mental status today.   HISTORY OF PRESENT ILLNESS: The patient is a 69 year old Caucasian female with a history of COPD, CAD, hypertension, tobacco abuse, was sent from home to ED due to altered mental status. The patient was noted to have respiratory distress with O2 sat at 87%. The patient was placed on BiPAP and now the patient is more alert, awake, oriented when I examine the patient. The patient complains of shortness of breath, cough today, but she denies any fever or chills. No chest pain, palpitation, orthopnea or nocturnal dyspnea. No leg edema. The patient denies any other symptoms. The patient's ABG showed pH of 7.28, pCO2 of 67.   PAST MEDICAL HISTORY: COPD, CAD and 4 stents in the past, hypertension, depression, tobacco abuse, hypothyroidism, breast cancer, GI bleeding 7 years ago due to NSAIDs.   SOCIAL HISTORY: Smoking 1 pack a day for 16 years. Denies any alcohol drinking or illicit drugs. Living with her son.   PAST SURGICAL HISTORY: Tonsillectomy, mastectomy, appendectomy, cholecystectomy and hysterectomy, excision of nonhealing chest wound with placement of wound VAC this year, history of right breast cancer, status post mastectomy.   FAMILY HISTORY: Father had a heart attack. Mother had aneurysm.   ALLERGIES: IBUPROFEN, PRILOSEC.   HOME MEDICATIONS: Synthroid 50 mcg p.o. daily, pantoprazole 40 mg p.o. daily, gabapentin 600 mg p.o. b.i.d., Cymbalta 60 mg p.o. daily, Bactrim DS 800 mg/160 mg p.o. tablets 2 tabs b.i.d., atorvastatin 40 mg p.o. at bedtime, atenolol 25 mg p.o. daily, aspirin 81 mg p.o. daily.   REVIEW OF SYSTEMS:    CONSTITUTIONAL: The patient denies any fever or chills. No headache or dizziness. No weakness.  EYES: No double  vision or blurry vision.  EARS, NOSE, THROAT: No postnasal drip, slurred speech or dysphagia.  CARDIOVASCULAR: No chest pain, palpitation, orthopnea or nocturnal dyspnea. No leg edema.  PULMONARY: Positive for cough, sputum, shortness of breath, but no hemoptysis.  GASTROINTESTINAL: No abdominal pain, nausea, vomiting or diarrhea. No melena or bloody stool.  GENITOURINARY: No dysuria, hematuria or incontinence.  SKIN: No rash or jaundice.  NEUROLOGIC: No syncope, loss of consciousness or seizure, but has decreased mental status.  ENDOCRINE: No polyuria, polydipsia, heat or cold intolerance.  HEMATOLOGIC: No easy bruising or bleeding.  MUSCULOSKELETAL: Positive for right lower extremity arthritis and deformity. No edema or  musculoskeletal pain.   PHYSICAL EXAMINATION: VITAL SIGNS: Temperature 98.3, blood pressure 118/57, pulse 71, respirations 20, O2 saturation 98% on oxygen by BiPAP.  GENERAL: The patient is alert, awake, oriented, in no acute distress.  HEENT: Pupils round, equal, reactive to light and accommodation. Moist oral mucosa. No discharge from ear or nose.  NECK: Supple. No JVD or carotid bruit. No lymphadenopathy. No thyromegaly.  CARDIOVASCULAR: S1, S2, regular rate and rhythm. No murmurs, gallop.  PULMONARY: Bilateral air entry. No obvious wheezing or crackles or rales. No use of accessory muscles to breathe, but on BiPAP.  ABDOMEN: Soft. No distention or tenderness. No organomegaly. Bowel sounds present.  EXTREMITIES: No edema, clubbing or cyanosis. No calf tenderness. Strong bilateral pedal pulses. Right lower extremity deformity.  SKIN: No rash or jaundice.  NEUROLOGIC: A and O x3. No focal deficits. Power 5/5. Sensation intact.   LABORATORY, DIAGNOSTIC AND RADIOLOGICAL DATA: Urinalysis is negative. Chest x-ray: Minimal  left lung base atelectasis versus early infiltrate. PH 7.28, pCO2 of 67. BNP 1018. WBC 5.3, hemoglobin 10.9, platelets 194. Electrolytes are normal, BUN 10,  creatinine 0.74, glucose 95. Troponin less than 0.02. EKG shows normal sinus rhythm at 64 BPM.   IMPRESSIONS: 1.  Acute respiratory failure.  2.  Chronic obstructive pulmonary disease exacerbation.  3.  Questionable pneumonia.  4.  Anemia.  5.  Hypertension.  6.  Coronary artery disease.  7.  Tobacco abuse.   PLAN OF TREATMENT: 1.  The patient will be admitted to stepdown unit. We will start aspiration and fall precautions. We will continue BiPAP. Start Solu-Medrol IV q.8 hours and start DuoNeb, Spiriva and Zithromax.  2.  Continue atenolol and aspirin and statin.  3.  For tobacco abuse, smoking cessation was counseled for 5 minutes.  4.  The patient wants full code. I discussed the patient's condition and plan of treatment with the patient.   TIME SPENT: About 65 minutes.   ____________________________ Demetrios Loll, MD qc:jm D: 03/04/2013 15:17:57 ET T: 03/04/2013 18:46:17 ET JOB#: [id number]  cc: Demetrios Loll, MD, <Dictator> Demetrios Loll MD ELECTRONICALLY SIGNED 03/06/2013 18:16

## 2014-08-29 NOTE — Consult Note (Signed)
ONCOLOGY followup note - still weak, denies pain issues. Trying to eat better.no fever. No bleeding issuesA, O x 3, NAD, chronically weak.          vitals - afebrile, stable          lungs - b/l good air entry          abd - soft, NT Impression/Recommendations:female with history of stage IIA right breast cancer treated in 2008, has been on hormonal therapy with Femara since then. More recently has had a rising CA 27.29 levels and has developed a large ulcerated lesion in the right chest wall area raising suspicion for recurrent/metastatic breast cancer. Biopsy of the right chest wall is negative for malignancy. However given abnormal CA 27.29 and new lung nodules, raises suspicion for recurrent metastatic breast cancer. She is not a candidate for chemotherapy given poor nutrition and poor performance status (ECOG 3). Will therefore change hormonal therapy from letrozole to Faslodex 500 mg IM q 4 weeks, first dose today. Patient explained about palliative intent of treatment, overall response rates and side effects and she expressed verbal consent to take thhis treatment. If condition declines further then will consider hospice. If discharged soon, will followup as outpatient and plan continued treatment.   Electronic Signatures: Jonn Shingles (MD)  (Signed on 05-Feb-14 00:09)  Authored  Last Updated: 05-Feb-14 00:09 by Jonn Shingles (MD)

## 2014-08-30 NOTE — Discharge Summary (Signed)
PATIENT NAME:  Betty Edwards, Betty Edwards MR#:  [id number] DATE OF BIRTH:  09/23/1945  DATE OF ADMISSION:  02/05/2014 DATE OF DISCHARGE:  02/07/2014   DISCHARGE DIAGNOSES:  1.  Acute bronchitis.  2.  Urinary tract infection.   SECONDARY DIAGNOSES:  1.  Hypertension.  2.  Diastolic congestive heart failure.  3.  Osteoarthritis.  4.  Asthma.  5.  Breast cancer on the right, stage IIa.  6.  History of gastrointestinal bleed.   CONSULTATIONS:  Palliative care.   PROCEDURES AND RADIOLOGY: Chest x-ray on 02/05/2014 showed no acute cardiopulmonary disease.   MAJOR LABORATORY PANEL: Urinalysis on admission showed 34 WBCs, 1+ bacteria, 2+ leukocyte esterase, positive nitrite.   HISTORY AND SHORT HOSPITAL COURSE: The patient is a 69 year old female with the above-mentioned medical problems who was admitted for cough, shortness of breath and fever thought to be secondary to acute bronchitis. Pneumonia was ruled out. She was also found to have a UTI based on urinalysis. She was improving with empiric antibiotics and IV steroids. Palliative care consultation was obtained with Dr. Izora Gala Phifer, who had a discussion with the patient and she was made DNR. She was instructed to follow up with oncology as an outpatient for ongoing discussion about her cancer and chemotherapy.   The patient was feeling much better. She was close to her baseline by 02/07/2014 and was discharged home in stable condition.   PHYSICAL EXAMINATION:  VITAL SIGNS: On the date of discharge, her vital signs were as follows: Temperature 98.1, heart rate 89 per minute, respirations 18 per minute, blood pressure 127/75 mm. She was saturating 99% on room air.   PERTINENT PHYSICAL EXAMINATION ON THE DATE OF DISCHARGE:  CARDIOVASCULAR: S1, S2 normal. No murmurs, rubs, or gallop.  LUNGS: Clear to auscultation bilaterally. No wheezing, rales, rhonchi, crepitation.  ABDOMEN: Soft, benign.  NEUROLOGIC: Nonfocal examination.  All other physical  examination remained at baseline.   DISCHARGE MEDICATIONS:  1.  Aspirin 81 mg p.o. daily.  2.  Protonix 40 mg p.o. daily.  3.  Exemestane 25 mg p.o. daily.  4.  Gabapentin 600 mg 1.5 tablets p.o. 4 times a day.  5.  Atropine/diphenoxylate 0.025/2.5 mg 1-2 tablets p.o. 4 times a day as needed.  6.  Atenolol 25 mg 1/2 tablet p.o. daily.  7.  Methadone 10 mg/5 mL, 30 mL p.o. daily.  8.  Xopenex 2 puffs inhaled every 4 hours as needed.  9.  Advair 250/50 1 puff inhaled twice a day.  10.  Buspirone 5 mg p.o. b.i.d.  11.  Magnesium oxide 400 mg p.o. daily.  12.  Levothyroxine 50 mcg p.o. daily.  13.  Levaquin 750 mg p.o. daily for 5 days.  14.  Prednisone 60 mg p.o. daily, tapered by 10 mg daily until finished.   DISCHARGE DIET: Low sodium.   DISCHARGE ACTIVITY: As tolerated.   DISCHARGE INSTRUCTIONS AND FOLLOWUP: The patient was instructed to follow up with her oncologist, Dr. Leia Alf, in 1-2 weeks.   TOTAL TIME DISCHARGING THIS PATIENT: 40 minutes.   CODE STATUS: DNR.   ____________________________ Lucina Mellow. Manuella Ghazi, MD vss:MT D: 02/08/2014 13:52:00 ET T: 02/09/2014 06:31:36 ET JOB#: [id number]  cc: Marolyn Urschel S. Manuella Ghazi, MD, <Dictator> Sandeep R. [name], MD Efraim Kaufmann, MD Lucina Mellow [facility] MD ELECTRONICALLY SIGNED 02/11/2014 15:48

## 2014-08-30 NOTE — Consult Note (Signed)
PATIENT NAME:  Betty Edwards, Betty Edwards MR#:  [id number] DATE OF BIRTH:  Jan 06, 1946  DATE OF CONSULTATION:  05/16/2013  REFERRING PHYSICIAN:  Graciella Freer, MD CONSULTING PHYSICIAN:  Cordelia Pen. Gretel Acre, MD  REASON FOR CONSULTATION: " I'm real depressed. I'm in a lot of pain".   HISTORY OF PRESENT ILLNESS:  The patient is a 69 year old female who was recently discharged from the [facility] 2 days ago, presented again to the ED complaining of having suicidal thoughts and reported that she is not feeling well and has been living with her 57 year old son as well as her daughter lives in the same complex. She stated that nothing has changed since she was discharged and she is feeling more depressed and having suicidal thoughts and wants to walk in front of the car. During my interview, the patient was noted to be lying in the bed. She reported that she continues to have withdrawal symptoms from the opioids since her discharge from the hospital, although she has not used any opioids including methadone. She reported that she wants to come back into the hospital so she can get some detox from the opioids. When I asked her about if she has used any methadone, she has reported that she has not used any methadone, but she would like to get treated for the opioid withdrawal medications. Reported that she feels depressed and she just lays in the bed. She reported that her son is diabetic and he does not do anything. The patient reported that she is not having any suicidal thoughts today, but she was thinking about suicide yesterday. She reported that she does not have any plans today. She stated that she was using methadone, which was prescribed in the past, but she has not used any methadone since her discharge from the hospital. She denied having any perceptual disturbances at this time.   PAST PSYCHIATRIC HISTORY:  The patient has a long history of depression and was recently discharged from the [facility] 2 days ago. She is currently taking a combination of Cymbalta and Neurontin and has an appointment with the [facility] next week. She does not have any history of bipolar disorder.   SUBSTANCE ABUSE HISTORY:  The patient has long history of using methadone and she went for heavy opioid abuse and reported that methadone was prescribed by her outpatient doctor in a highly controlled environment but she was abusing it. She came in for the detox for the methadone. However, she remained adamant that she is still having withdrawal symptoms from the methadone although she was detoxed in the inpatient unit.   SOCIAL HISTORY:  The patient currently lives with her son, who is diabetic. She stated that her daughter lives in the same complex. She feels lonely at times.   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 She has been diagnosed with COPD and dyslipidemia, a history of hypertension, hypothyroidism, gastric reflux.   FAMILY HISTORY:  Depression.   CURRENT MEDICATIONS: 1.  Cymbalta 90 mg p.o. daily.  2.  Xopenex 2 puffs inhaled every 4 hours. 3.  Potassium chloride 10 mg daily. 4.  Pantoprazole 40 mg p.o. daily.  5.  Levothyroxine 50 mcg daily.  6.  Gabapentin 300 mg 2 pills 3 times daily. 7.  Fluticasone nasal spray  2 sprays once daily.  8.  Atorvastatin 40 mg daily. 9.  Actonel 25 mg once daily.  10.  Aspirin 81 mg daily.   ALLERGIES:  IBUPROFEN AND PRILOSEC.   MENTAL STATUS EXAMINATION:  The patient is an older-looking female who appears older than her stated age. She was calm and cooperative during the interview. She maintained fair eye contact. Her psychomotor activity was slow and sluggish. Affect was calm. Mood was fine. Thought process was logical and goal-directed. Thought content  was nondelusional. She currently denied having any auditory or visual hallucinations. She denied having any suicidal or homicidal ideations or plans. She appeared at her baseline.   PHYSICAL EXAMINATION:  CONSTITUTIONAL:  Denies any fever or chills. No weight changes.  EYES:  No double or blurred vision.  RESPIRATORY:  No shortness of breath or cough.  CARDIOVASCULAR:  No chest pain or orthopnea.  GASTROINTESTINAL:  No abdominal pain, nausea, vomiting, diarrhea.  GENITOURINARY:  No incontinence or frequency.  ENDOCRINE:  No heat or cold intolerance.  LYMPHATIC:  No anemia or easy bruising.  INTEGUMENTARY:  No acne or rash.  MUSCULOSKELETAL:  Denies any muscle or joint pain.  NEUROLOGIC:  No tingling or weakness.   PHYSICAL EXAMINATION: VITAL SIGNS:  Temperature 98.3, pulse 74, respirations 18, blood pressure 95/74.  LABORATORY, DIAGNOSTIC, AND RADIOLOGICAL DATA:  Glucose 88, BUN 17, creatinine 0.88, sodium 136, potassium 4.2, chloride 106, bicarbonate 29, anion gap 1, osmolality 273, calcium 8.4. Blood alcohol less than 3. Protein 6.8, albumin 2.8, bilirubin 0.3, alkaline phosphatase 125, AST 22, ALT 18. TSH 1.7. UA is only positive for methadone, but it was positive at the time of her discharge from the hospital as well. WBC 4.9, RBC 4.18, hemoglobin 11.6, hematocrit 36.7, platelet count 275, MCV 88, MCH 31.5.   DIAGNOSTIC IMPRESSION:  AXIS I: Major depressive disorder, recurrent, moderate; opioid dependence.  AXIS II: None.  AXIS III: Please review the medical history.   TREATMENT PLAN:  I discussed with the patient about the medications, treatment risks, benefits and alternatives. She currently denied having any suicidal or homicidal ideations or plans and is only looking for opioid detox although she was already detoxed while she was in the [facility] and has not used any more opioids at this time. The patient will not be given any more medications and she has an  appointment with [facility] next week. The patient will be discharged in a stable condition from the Emergency Room at this time. I advised her to call her outpatient provider or come back to the ER if she notices worsening of her symptoms and she demonstrated understanding.   Thank you for allowing me to participate in the care of this patient.   ____________________________ Cordelia Pen. Gretel Acre, MD usf:jm D: 05/16/2013 13:15:34 ET T: 05/16/2013 13:52:14 ET JOB#: [id number]  cc: Cordelia Pen. Gretel Acre, MD, <Dictator> Jeronimo Norma MD ELECTRONICALLY SIGNED 05/23/2013 9:06

## 2014-08-30 NOTE — H&P (Signed)
PATIENT NAME:  Betty Edwards, ISAIS MR#:  [id number] DATE OF BIRTH:  07-28-1945  DATE OF ADMISSION:  05/07/2013  IDENTIFYING INFORMATION AND CHIEF COMPLAINT: A 69 year old woman presented to the Emergency Room, complaining of depression and suicidal ideation.   HISTORY OF PRESENT ILLNESS: Information obtained from the patient and the chart. The patient says that she has been feeling depressed for several weeks. Mood has been down and sad. Frequent tearfulness. Energy level low. Poor sleep, poor appetite. Some hopelessness. Has started to have suicidal ideation without any plan or intention. Denies any psychotic symptoms. The patient emphasizes that this started prior to a recent problem she has had with her methadone. A couple of weeks ago, she suffered a fall at home which increased her pain. She began using her methadone that she was prescribed at a higher dose than she was supposed to and she ran out of it early. When she got in touch with her outpatient physician they refused to give her any more methadone and told her that they would not see her anymore.   PAST PSYCHIATRIC HISTORY: The patient says she has had long-standing depression. Denies psychiatric hospitalizations. Denies suicide attempts. Denies history of psychosis. She has been on Cymbalta for a long time, which has been effective for it. No history of bipolar disorder.   SUBSTANCE ABUSE HISTORY: Denies alcohol use. She has a past history of abuse of opiates that started years ago. She went through heavy opiate abuse. Now she has chronic pain and is prescribed methadone by her outpatient doctor on a tightly controlled agreement apparently.   SOCIAL HISTORY: The patient lives with her adult son. She is disabled. Seems to have little other social contact. Does not get out of the house much. Feels lonely a lot of the time.   PAST MEDICAL HISTORY: The patient has severe crippling arthritis that has deformed her right knee. She has a history of  cancer. She has had infections of her extremities including a MRSA infection of a right great toe that resulted in amputation. She has chronic COPD and dyslipidemia and high blood pressure as well. Also, hypothyroid. Also, gastric reflux.   FAMILY HISTORY: Positive for multiple people with depression.   CURRENT MEDICATIONS: Aspirin 81 mg per day, atenolol 25 mg a day, atorvastatin 40 mg a day, duloxetine 60 mg a day, Flonase nasal spray daily, gabapentin 600 mg 3 times a day, levothyroxine 50 mcg a day, pantoprazole 40 mg a day, potassium chloride 10 mEq a day and methadone which just recently ran out.   ALLERGIES: IBUPROFEN AND PRILOSEC.   MENTAL STATUS EXAM: Disheveled, sick-looking woman, looks older than her stated age. Cooperative with the interview. Good eye contact. Psychomotor activity very slow and sluggish. Affect flat tearful. Mood stated as depressed. Thoughts slow but lucid. No sign of delusions. Denies auditory or visual hallucinations. Denies homicidal ideation. Positive suicidal ideation without plan or intent. Alert and oriented x 4. Baseline intelligence normal.   PHYSICAL EXAMINATION: GENERAL: The patient looks very chronically ill and underweight. Her left leg has a knee that is grossly crippled and malformed. She is unable to walk as a result. Her feet are red and look hypoxic. She has an amputation of the right great toe partially, but no open wound on it. No open oozing wound was identified.  HEENT: Pupils equal and reactive. Face symmetric.  EXTREMITIES: Strength is normal and symmetric in the upper extremities. Lower extremities much decreased strength almost nonexistent in the right lower extremity.  Very little range of motion in the lower extremities.  LUNGS: Mild wheezing, especially on the right side.  HEART: Regular rate and rhythm.  ABDOMEN: Soft, nontender, normal bowel sounds.  VITAL SIGNS: Temperature 97.7, pulse 100, respirations 20, blood pressure 143/73.    LABORATORY RESULTS: Salicylates and acetaminophen nontoxic. Chemistry panel shows glucose normal. BUN slightly high at 20. Sodium low at 135. CBC shows a chronic anemia. Hematocrit low at 34.8. Urinalysis noninfected. Drug screen positive for methadone.   Chest x-ray stable cardiac enlargement. Stable nodules in the right lung base.   EKG unremarkable.   TSH normal at 2.3.   ASSESSMENT: A 69 year old woman with major depression, suicidal ideation. Needs hospital level treatment. Also coming off of her methadone. She does have an Methicillin-resistant Staphylococcus aureus  infection. I have talked to the infection control nurse, who confirmed that the patient can be admitted to the hospital because she can handle her own handwashing.   TREATMENT PLAN: Admit to psychiatry. Increased duloxetine to 90 mg a day. Continue other medicines. DC'd the methadone. Replace it with Suboxone 3 day taper. Engage her in groups and activities on the unit. She will be on contact isolation. Per the current guidelines if she can exercise, appropriate handwashing and contact behavior. She can attend groups.   DIAGNOSIS, PRINCIPAL AND PRIMARY:  AXIS I: Major depression, severe, recurrent.   SECONDARY DIAGNOSES: AXIS I: Opiate dependence, in partial remission.  AXIS II: Deferred.  AXIS III: History of lung cancer and breast cancer, severe deforming arthritis, chronic pain, hypertension, chronic obstructive pulmonary disease, hypothyroidism. Gastric reflux symptoms. Methicillin-resistant Staphylococcus aureus  infection.  AXIS IV: Severe from illness.  AXIS V: Functioning at time of evaluation: 25.   ____________________________ Gonzella Lex, MD jtc:sg D: 05/08/2013 14:04:43 ET T: 05/08/2013 14:44:57 ET JOB#: [id number]  cc: Gonzella Lex, MD, <Dictator> Gonzella Lex MD ELECTRONICALLY SIGNED 05/15/2013 23:32

## 2014-08-30 NOTE — Discharge Summary (Signed)
PATIENT NAME:  Betty Edwards, Betty Edwards MR#:  [id number] DATE OF BIRTH:  14-Jun-1945  DATE OF ADMISSION:  05/08/2013 DATE OF DISCHARGE:  05/13/2013  HOSPITAL COURSE: See dictated history and physical for details of admission. A 69 year old woman came into the hospital reporting suicidal ideation and depression accompanied by chronic pain. Some of the precipitating event was running out of her pain medicine and potential going through withdrawal from narcotics. In the hospital, the patient was treated with a three day taper using Suboxone. She had some pain and discomfort, but no signs of delirium and generally remained physically stable. At this point, she is off narcotic pain medicine and is able to move herself around in the wheelchair and although she is having pain is tolerant of it. I have spoken to her primary care doctor, who will no longer be prescribing methadone treatment. The patient is requesting referral back to [facility] in [place]. We will refer her back to [facility], also to [facility] locally where she may inquire about Suboxone treatment. She has increased the dose of her Cymbalta to 90 mg a day. Mood is improved. She has attended therapy and participated in individual counseling. Denies any suicidal ideation. Shows positive thoughts about the future. No sign of acute psychosis. She is agreeable for discharge to the community.   DISCHARGE MEDICATIONS: Xopenex inhaler 2 puffs every four hours as needed for shortness of breath, gabapentin 600 mg 3 times a day, Cymbalta 90 mg a day, atorvastatin 40 mg once a day, aspirin 81 mg a day, atenolol 25 mg a day, potassium chloride 10 mEq a day, Flonase nasal spray two sprays in each nostril once a day, pantoprazole 40 mg once a day, levothyroxine 50 mcg a day.   LABORATORY RESULTS: Salicylates and acetaminophen slightly elevated, but not toxic on admission. Alcohol level negative. Chemistry panel showed a low sodium at 135, elevated BUN at 20, albumin low at 2.9. CBC showed  a slightly low hematocrit at 34.8, low hemoglobin at 11.0. Normal white count. Urinalysis unremarkable. Drug screen was negative, except for being positive for methadone.   MENTAL STATUS EXAM AT DISCHARGE: Casually dressed, adequately groomed, but chronically ill-looking woman, looks older than her stated age. Cooperative with the interview. Eye contact good. Psychomotor activity is still slow and sluggish because of her medical problems. Speech is normal rate, tone and volume. Affect is still slightly blunted, but better than it was before and her mood is stated as being good and not depressed. Thoughts are lucid without loosening of associations or delusions. Denies auditory or visual hallucinations. Denies suicidal or homicidal ideation. Shows improved judgment and insight. Normal intelligence. Alert and oriented x 4.   DISPOSITION: I spoke to her primary care doctor, who will no longer be prescribing methadone. We have her tapered off of her narcotics because she does not have an immediate provider at the time of discharge, but she will be referred to local providers who may be able to consider whether chronic appropriate for her.   DIAGNOSIS, PRINCIPAL AND PRIMARY:  AXIS I: Major depression, recurrent, severe.   SECONDARY DIAGNOSES: AXIS I: Opiate dependence.  AXIS II: Deferred.  AXIS III: Hypothyroidism, chronic pain, history of breast cancer, history of lung cancer, chronic obstructive pulmonary disease, elevated cholesterol, hypertension, nasal congestion, gastric reflux symptoms.  AXIS IV: Severe from multiple medical problems and social isolation.  AXIS V: Functioning at time of discharge: 51.  ____________________________ Gonzella Lex, MD jtc:sg D: 05/13/2013 12:50:56 ET T: 05/13/2013 13:52:01 ET  JOB#: [id number]  cc: Gonzella Lex, MD, <Dictator> Gonzella Lex MD ELECTRONICALLY SIGNED 05/15/2013 23:33

## 2014-08-30 NOTE — Consult Note (Signed)
PATIENT NAME:  Betty Edwards, Betty Edwards MR#:  [id number] DATE OF BIRTH:  1945/08/27  DATE OF CONSULTATION:  02/06/2014  REFERRING PHYSICIAN:   CONSULTING PHYSICIAN:  Cherree Conerly R. [name], MD  REFERRING PHYSICIAN: Dr. Lunette Stands.    CONSULTING PHYSICIAN:  Dr. Leia Alf.   REASON FOR CONSULTATION: Metastatic breast cancer.   HISTORY OF PRESENT ILLNESS: The patient is a 69 year old female with past medical history of stage IV breast cancer with lung metastasis in the right lower lung status post multiple lines of treatment in the past and more recently on chemotherapy with weekly Halaven which she started around July 15. 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 She recently got chemotherapy on September 23 (cycle 4 dose). The patient has been admitted to the hospital yesterday with complaints of cough, shortness of breath, and fever. Chest x-ray was negative for obvious infiltrates, but she was hypoxic. The patient also has urinary tract infection. She is currently on broad-spectrum antibiotic coverage with Levaquin, IV steroids, and other supportive treatment. Clinically states that she is beginning to feel much better today. No fever since last night.   PAST MEDICAL HISTORY AND SURGICAL HISTORY: As in HPI above.   FAMILY HISTORY: Noncontributory.   SOCIAL HISTORY: History of chronic smoking. Denies alcohol or recent recreational drug usage.   ALLERGIES: INCLUDE PRILOSEC AND IBUPROFEN.   HOME MEDICATIONS: Methadone 10 mg daily, Xopenex 2 puffs q. 4 hours p.r.n., Protonix 40 mg daily, magnesium oxide 400 mg daily, levothyroxine 50 mcg daily, gabapentin 600 mg q.i.d., buspirone 5 mg b.i.d., atenolol 25 mg daily, aspirin 81 mg daily,  Advair Diskus 1 puff b.i.d.   REVIEW OF SYSTEMS:  CONSTITUTIONAL: As in HPI. No fever or chills today.  HEENT: Denies any headaches or dizziness at rest. No epistaxis, ear, or jaw pain.  CARDIAC: No angina, palpitation, orthopnea, or PND.  LUNGS: Has dyspnea and currently on nasal cannula oxygen. Cough is improving. No hemoptysis or major right-sided chest pains.  GASTROINTESTINAL: No nausea, vomiting, or diarrhea. No blood in stools.  GENITOURINARY: Denies dysuria or hematuria.  SKIN: No new rashes or pruritus.  HEMATOLOGIC: Denies obvious bleeding issues.  MUSCULOSKELETAL: Has chronic pain which is unchanged.  NEUROLOGIC: No new focal weakness, seizures, or loss of consciousness.  ENDOCRINE: No polyuria or polydipsia. Appetite is fairly steady.   PHYSICAL EXAMINATION:  GENERAL: The patient is chronically weak and frail-looking, resting in bed, on nasal cannula oxygen, otherwise alert and oriented and converses appropriately. No acute distress.  VITAL SIGNS: 98.9, 80, 18, 139/68, 98% on 2 liters.  HEENT: Normocephalic, atraumatic. Extraocular movements intact. Sclerae anicteric. No oral thrush.  NECK: Negative for lymphadenopathy.  CARDIOVASCULAR: S1, S2, regular rate and rhythm.  LUNGS: Bilateral diminished breath sounds more so in the bases, occasional rhonchi.  ABDOMEN: Soft, nontender.  EXTREMITIES: Show no progressive edema or cyanosis.  SKIN: No generalized rash or major bruising.   LABORATORY RESULTS: WBC 4600, ANC 4200, hemoglobin 9.7, platelets 273,000. Creatinine 0.84, calcium 8.5. UA positive for nitrite and leukocyte esterase. Chest x-ray negative for active cardiopulmonary disease.   IMPRESSION AND RECOMMENDATION: A 69 year old female patient with history of multiple medical problems and chronic poor functional status and wheelchair bound for years now, known history of stage IV metastatic breast cancer on palliative chemotherapy with Halaven which she  seems to be tolerating  fairly well so far, now admitted with symptoms of bronchitis and also felt to have urinary tract infection. The patient is on treatment with levofloxacin, IV steroids, and other supportive therapy including oxygen. She is clinically improving well. Agree with ongoing supportive treatment. CBC does not show any major cytopenia, WBC/ANC is in the normal range. She does not have major pain issues, chronic pain is under control with current opioid regimen. No active oncology issues at this time, we will continue to follow during hospitalization as indicated. Otherwise the patient has been advised to keep followup appointments at cancer center the same as previously scheduled once she is discharged.   Thank you for the referral. Please feel free to contact me for additional questions.     ____________________________ Rhett Bannister [name], MD srp:bu D: 02/06/2014 15:22:38 ET T: 02/06/2014 16:06:15 ET JOB#: [id number]  cc: Betty Edwards R. [name], MD, <Dictator> Alveta Heimlich MD ELECTRONICALLY SIGNED 02/06/2014 23:17

## 2014-08-30 NOTE — Discharge Summary (Signed)
PATIENT NAME:  Betty Edwards, BERKLEY MR#:  [id number] DATE OF BIRTH:  [birth year]  DATE OF ADMISSION:  05/09/2013 DATE OF DISCHARGE:  05/13/2013  HOSPITAL COURSE: See dictated history and physical for details of admission. A 69 year old woman came into the hospital reporting suicidal ideation and depression accompanied by chronic pain. Some of the precipitating event was running out of her pain medicine and potential going through withdrawal from narcotics. In the hospital, the patient was treated with a three day taper using Suboxone. She had some pain and discomfort, but no signs of delirium and generally remained physically stable. At this point, she is off narcotic pain medicine and is able to move herself around in the wheelchair and although she is having pain is tolerant of it. I have spoken to her primary care doctor, who will no longer be prescribing methadone treatment. The patient is requesting referral back to [facility] in [place]. We will refer her back to [facility], also to [facility] locally where she may inquire about Suboxone treatment. She has increased the dose of her Cymbalta to 90 mg a day. Mood is improved. She has attended therapy and participated in individual counseling. Denies any suicidal ideation. Shows positive thoughts about the future. No sign of acute psychosis. She is agreeable for discharge to the community.   DISCHARGE MEDICATIONS: Xopenex inhaler 2 puffs every four hours as needed for shortness of breath, gabapentin 600 mg 3 times a day, Cymbalta 90 mg a day, atorvastatin 40 mg once a day, aspirin 81 mg a day, atenolol 25 mg a day, potassium chloride 10 mEq a day, Flonase nasal spray two sprays in each nostril once a day, pantoprazole 40 mg once a day, levothyroxine 50 mcg a day.   LABORATORY RESULTS: Salicylates and acetaminophen slightly elevated, but not toxic on admission. Alcohol level negative. Chemistry panel showed a low sodium at 135, elevated BUN at 20, albumin low at 2.9. CBC showed  a slightly low hematocrit at 34.8, low hemoglobin at 11.0. Normal white count. Urinalysis unremarkable. Drug screen was negative, except for being positive for methadone.   MENTAL STATUS EXAM AT DISCHARGE: Casually dressed, adequately groomed, but chronically ill-looking woman, looks older than her stated age. Cooperative with the interview. Eye contact good. Psychomotor activity is still slow and sluggish because of her medical problems. Speech is normal rate, tone and volume. Affect is still slightly blunted, but better than it was before and her mood is stated as being good and not depressed. Thoughts are lucid without loosening of associations or delusions. Denies auditory or visual hallucinations. Denies suicidal or homicidal ideation. Shows improved judgment and insight. Normal intelligence. Alert and oriented x 4.   DISPOSITION: I spoke to her primary care doctor, who will no longer be prescribing methadone. We have her tapered off of her narcotics because she does not have an immediate provider at the time of discharge, but she will be referred to local providers who may be able to consider whether chronic appropriate for her.   DIAGNOSIS, PRINCIPAL AND PRIMARY:  AXIS I: Major depression, recurrent, severe.   SECONDARY DIAGNOSES: AXIS I: Opiate dependence.  AXIS II: Deferred.  AXIS III: Hypothyroidism, chronic pain, history of breast cancer, history of lung cancer, chronic obstructive pulmonary disease, elevated cholesterol, hypertension, nasal congestion, gastric reflux symptoms.  AXIS IV: Severe from multiple medical problems and social isolation.  AXIS V: Functioning at time of discharge: 7.  ____________________________ Gonzella Lex, MD jtc:sg D: 05/13/2013 12:50:56 ET T: 05/13/2013 13:52:01 ET  JOB#: [id number]  cc: Gonzella Lex, MD, <Dictator> Gonzella Lex MD ELECTRONICALLY SIGNED 05/15/2013 23:33

## 2014-08-30 NOTE — H&P (Signed)
PATIENT NAME:  Betty Edwards, Betty Edwards MR#:  [id number] DATE OF BIRTH:  01-22-1946  DATE OF ADMISSION:  02/05/2014  PRIMARY CARE PHYSICIAN:  Nonlocal.   PRIMARY ONCOLOGIST: Dr. [name].  CHIEF COMPLAINT: Cough, shortness of breath and fever.   HISTORY OF PRESENT ILLNESS:  Betty Edwards is a 69 year old female with history of COPD, continued tobacco use and lung CA, currently on chemotherapy who comes to the Emergency Department with the complaints of cough, shortness of breath and fever of 101 degrees Fahrenheit.  The patient states her last chemotherapy was given last Tuesday.  The patient states that she mostly stays in the bed and has severe generalized weakness.  She has been having productive sputum of greenish-yellowish sputum.  Workup in the Emergency Department, chest x-ray did not show any obvious infiltrates, however, ABG shows PAO2 of 43 on room air.  The patient is also found to have urinary tract infection.  The patient was given levofloxacin in the Emergency Department.    PAST MEDICAL HISTORY:   1. Hypertension. 2. Congestive heart failure diastolic.  3. Osteoarthritis.  4. Asthma. 5. Previous history of upper GI bleed due to NSAID use.  6. Stage IIa right breast cancer.   7. History of drug dependency, currently on methadone treatment.   PAST SURGICAL HISTORY: 1. Stage IIa right breast cancer status post mastectomy.  2. Right femur lesion on bone scan, biopsied. 3. Cholecystectomy. 4. Appendectomy. 5. Total abdominal hysterectomy with oophorectomy.    ALLERGIES:  IBUPROFEN, PRILOSEC.  HOME MEDICATIONS: 1. Xopenex 2 puffs every 4 hours as needed. 2. Protonix 40 mg once a day. 3. Methadone 10 mg orally once a day.  4. Magnesium oxide 400 mg once a day. 5. Levothyroxine 50 mcg once a day. 6. Gabapentin 600 mg 4 times a day. 7. Buspirone 5 mg 2 times a day.  8. Atenolol 25 mg   once a day.  9. Aspirin 81 mg once a day.  10. Advair Diskus 1 puff 2 times a day.   SOCIAL HISTORY:  Continues to smoke 1 pack a day.  Denies drinking alcohol or using illicit drugs.  Lives with her son.   FAMILY HISTORY:  Mother with uterine cancer, daughter with teratoma.   REVIEW OF SYSTEMS: CONSTITUTIONAL: Experiencing generalized weakness.   EYES: No change in vision. EARS, NOSE AND THROAT: No change in hearing.  RESPIRATORY: Has cough and shortness of breath. CARDIOVASCULAR: No chest pain on palpation.  GASTROINTESTINAL: No nausea, vomiting or abdominal pain.  GENITOURINARY: No dysuria or hematuria.  HEME: No easy bruising or bleeding.  SKIN: No rash or lesions.  MUSCULOSKELETAL: Has osteoarthritis.   NEUROLOGIC: No weakness or numbness in any part of the body.    PHYSICAL EXAMINATION:  GENERAL: This is a frail-looking female who looks much older than her stated age, lying down in the bed, not in distress. VITAL SIGNS: Temperature 98.4, pulse 99, blood pressure 122/65, respiratory rate of 18.  Oxygen saturation is 93% on 2 liters of oxygen.   HEENT: Head normocephalic, atraumatic.  There is no scleral icterus.  Conjunctive normal.  Pupils equal and reactive to light.  Mucous membranes moist.  No pharyngeal erythema.  NECK: Supple. No lymphadenopathy.  No JVD.  No carotid bruit. CHEST: Has no focal tenderness.   LUNGS: Prolonged expiratory phase bilaterally.   HEART: S1 and S2.  Regular rate and rhythm.  No murmurs are heard.   ABDOMEN: Bowel sounds are present.  Soft, nontender, nondistended.   EXTREMITIES: No  pretibial edema.  Pulses 2+. NEUROLOGIC: The patient is alert, oriented to place, person and time.  Cranial nerves II-XII intact.  Motor 5/5 in upper and lower extremities.    LABORATORY DATA:  CBC, WBC 5.7, hemoglobin 9.6, platelet count of 262,000.  Potassium 3.4.  The rest of the laboratories are all within normal limits.    Troponin less than 0.02.  Chest x-ray 1 view portable, no acute cardiopulmonary disease.    ABG, PAO2 of 43.  Urinalysis is positive for  nitrates, leukocyte esterase 2+.  WBC of 34.    ASSESSMENT AND PLAN: Betty Edwards is a 69 year old female who comes to the Emergency Department with fever.  1. Pneumonia.  Chest x-ray does not indicate any obvious infiltrates.  Concerning about the patient's low . We will treat it as a healthcare associated pneumonia considering the patient's recent chemotherapy. Continue with levofloxacin and also keep the patient on vancomycin.  Follow-up with blood cultures.  2. Urinary tract infection.  Follow-up with urine cultures.  Continue the current regimen covering for the UTI. 3. Lung cancer on chemotherapy.  The patient will follow-up as an outpatient with Dr. [name].  No current issues.   4. Anemia of chronic disease.  No current indication for any transfusion. 5. Tobacco use.  Counseled the patient.  6. Severe debility.  Involve in physical therapy.  7. Keep the patient on deep venous thrombosis prophylaxis with Lovenox.  TIME SPENT:  50 minutes.    ____________________________ Monica Becton, MD pv:JT D: 02/06/2014 00:18:32 ET T: 02/06/2014 00:42:12 ET JOB#: [id number]  cc: Monica Becton, MD, <Dictator> Monica Becton MD ELECTRONICALLY SIGNED 02/07/2014 22:59

## 2014-08-31 NOTE — Discharge Summary (Signed)
PATIENT NAME:  Betty Edwards, ARATA MR#:  [id number] DATE OF BIRTH:  May 11, 1945  DATE OF ADMISSION:  05/20/2011 DATE OF DISCHARGE:  05/23/2011  DISCHARGE DIAGNOSES: 1. Cellulitis.  2. History of methicillin-resistant Staphylococcus aureus.   SECONDARY DIAGNOSES:  1. History of opiate addiction and chronic pain now on methadone maintenance therapy.  2. Hypothyroidism.  3. Hypertension.  4. Hyperlipidemia.  5. Chronic insomnia.  6. Nicotine dependence.  7. Depression.   DISCHARGE MEDICATIONS: Patient is to resume all home medications. Please see discharge instructions for full details to this regard. Additionally, patient is to begin taking the following two medications:  1. Augmentin XR 2 tabs by mouth twice a day for seven days.  2. Bactrim DS, 1 tab by mouth twice a day for seven days.   HOSPITAL COURSE: This patient is a 69 year old Caucasian female with past medical history as noted above who was seen by her primary care physician, Dr. Lamonte Sakai, for follow up for her cellulitis on date of admission and was deemed to have failed outpatient therapy. Patient was sent for direct admission to [facility]. Please see history and physical examination on date of admission for full details regarding her initial triage, evaluation, and treatment. Patient was admitted to a standard medical bed and started on IV antibiotics consisting of Zosyn and levofloxacin. The levofloxacin was subsequently discontinued and vancomycin was instituted instead. Upon continuation of the IV Zosyn and IV vancomycin, the patient's cellulitis did rapidly improve. Her secondary diagnoses/medical comorbidities remained stable throughout her hospitalization.   The patient refused placement, home health, and similar services, even though it was felt she would benefit greatly from these services. Collateral information from [facility]' nursing staff and Dr. Lamonte Sakai confirms she has done  precisely the same in the past.   On day of discharge, patient was deemed in satisfactory condition to continue antibiotic therapy by mouth as an outpatient and she will have close follow up with her primary care physician, Dr. Lamonte Sakai.   CONDITION ON DISCHARGE: Patient was discharged from the hospital in satisfactory condition.   DIET: Low salt diet.   ACTIVITY: As tolerated.   FOLLOW UP: Follow-up visit with Dr. Lamonte Sakai of [place] within seven days, sooner if needed.   TIME SPENT: Discharge time spent including coordination of care, patient education and the like was greater than 30 minutes.  ____________________________ Kizzie Furnish Audie Clear, dictating on behalf of Dr. Elijio Miles mgm:cms D: 05/23/2011 08:32:30 ET T: 05/23/2011 08:37:31 ET JOB#: [id number]  cc: Kizzie Furnish. Prudence Davidson, PA, <Dictator> Elwyn Reach PA ELECTRONICALLY SIGNED 05/24/2011 14:06 Veverly Fells MD ELECTRONICALLY SIGNED 06/10/2011 12:15

## 2014-08-31 NOTE — H&P (Signed)
PATIENT NAME:  Betty Edwards, Betty Edwards MR#:  [id number] DATE OF BIRTH:  02/05/1946  DATE OF ADMISSION:  05/20/2011  PRIMARY CARE PHYSICIAN: Dr. Humphrey Rolls  CHIEF COMPLAINT: Persistent foot infection. Patient went to see Dr. Humphrey Rolls today and was asked to be admitted directly because of persistent infection in the legs. Patient is changed 12.   HISTORY OF PRESENT ILLNESS: Patient is a 69 year old female with history of hypertension, history of breast cancer, hyperlipidemia, depression has been having infection in the right and left foot and Dr. Humphrey Rolls gave her Betty Edwards last week. She wen to follow up and because of persistent infection more so on the right leg she was sent in for IV antibiotics. Patient lives alone and she feels very tired. She is seen today bedside. Denies any chest pain, no trouble breathing. Has some pain in the legs. Has no fever.   PAST MEDICAL HISTORY:  1. History of coronary artery disease and four stents in the heart. 2. History of hypertension. 3. Depression.  4. History of tobacco abuse.  5. Hypothyroidism. 6. History of breast cancer.  7. History of GI bleed six years ago because of non-steroidal anti-inflammatory medications.   ALLERGIES: Patient is allergic to ibuprofen and Prilosec.   SOCIAL HISTORY: Still smoking 1 pack per day, does not want to quit. No alcohol. No drugs. Patient lives alone.   PAST SURGICAL HISTORY:  1. Tonsillectomy. .  3. History of mastectomy.  4. Appendectomy.  5. Cholecystectomy.  6. Hysterectomy.   MEDICATIONS:  1. Cymbalta 60 mg once a day.  2. Femara 2.5 mg p.o. daily.  3. Furosemide 20 mg daily. 4. Hemocyte Plus 1 tablets p.o. daily.  5. Lipitor 40 mg at bedtime. 6. Lyrica 100 mg p.o. 3 times a day. 7. Methadone 10 mg p.o. daily taken for drug rehab.  8. Nexium 40 mg every day. 9. Plavix 75 mg p.o. daily.  10. Synthroid 50 mcg p.o. daily.  11. Toprol-XL 25 mg p.o. b.i.d.   REVIEW OF SYSTEMS: CONSTITUTIONAL: She is drowsy. EYES: No  blurred vision. ENT: No tinnitus. No ear pain. No hearing loss. No difficulty swallowing. No epistaxis. RESPIRATORY: Has no cough. No wheezing. CARDIOVASCULAR: No chest pain. GASTROINTESTINAL: No nausea. No vomiting. No abdominal pain. GENITOURINARY: No dysuria. ENDOCRINE: No polydipsia or nocturia. INTEGUMENT: No skin rashes. Has bilateral foot infection, but right leg is swollen. MUSCULOSKELETAL: History of chronic back pain. NEUROLOGIC: No numbness. No weakness. PSYCH: Has depression   PHYSICAL EXAMINATION:  VITAL SIGNS: Temperature 98, pulse 71, respirations 18, blood pressure 132/76, sating 94% on room air.   GENERAL: Alert, awake, oriented.   HEENT: Head atraumatic, normocephalic. Pupils are equally reacting to light. Extraocular movements are intact. ENT: No tympanic membrane congestion. No turbinate hypertrophy. Has no oropharyngeal erythema.   NECK: Thyroid nontender. No lymphadenopathy. No JVD.   RESPIRATORY: Clear to auscultation. No wheeze. No rales.   CARDIOVASCULAR: S1, S2, without murmurs.   ABDOMEN: Soft, nontender, nondistended. Bowel sounds present.   SKIN: No skin rashes.   EXTREMITIES: Patient's leg exam has bilateral feet have blackish discoloration mainly on the soles and red. Right leg is more swollen and tender to touch.    PSYCH: Patient is somewhat sleepy.   NEUROLOGIC: Patient has no focal neurological deficit.  LABORATORY, DIAGNOSTIC AND RADIOLOGICAL DATA: Lab data done yesterday outside with Dr. Humphrey Rolls. Patient had lab work done on 01/09. WBC 6.2, hemoglobin 9.4, hematocrit 31.8, platelets 241. Electrolytes: Sodium 140, potassium 3.5, chloride 106, bicarbonate 26, BUN 12,  creatinine 0.65, glucose 73.      ASSESSMENT AND PLAN: This is a 69 year old female with:  1. Leg cellulitis, more on the right. Patient failed outpatient ectopy. Therapy. She is going to be admitted for IV antibiotics with vancomycin and Zosyn and follow blood cultures. Also will check the  lower extremity ultrasound for further evaluation of deep vein thrombosis.  2. Patient had history of MRSA from the right index finger in September last year and she is on contact isolation. 3. History of depression on Cymbalta and also Lyrica, continue that.  4. History of chronic pain. She is on methadone dose 10 mg daily as per the drug rehab program.   5. History of coronary artery disease. She is on Lipitor and Plavix. Continue that along with Toprol.  6. Hypothyroidism. Continue Synthroid with normal TSH recently two days ago. 7. Iron deficiency anemia. She was started on Hemocyte Plus and she is following with Dr. Humphrey Rolls as an outpatient. 8. Deconditioning. Patient will be seen by physical therapist but she refused any placement.   TIME SPENT ON HISTORY AND PHYSICAL: 60 minutes.   ____________________________ Epifanio Lesches, MD sk:cms D: 05/20/2011 13:45:01 ET T: 05/20/2011 14:20:00 ET  JOB#: [id number] cc: Dr. Jennye Moccasin MD ELECTRONICALLY SIGNED 05/22/2011 16:28

## 2014-09-03 LAB — CBC CANCER CENTER
BASOS ABS: 0 x10 3/mm (ref 0.0–0.1)
Basophil %: 0.9 %
Eosinophil #: 0.2 x10 3/mm (ref 0.0–0.7)
Eosinophil %: 3.7 %
HCT: 34.7 % — ABNORMAL LOW (ref 35.0–47.0)
HGB: 10.8 g/dL — AB (ref 12.0–16.0)
LYMPHS ABS: 2.2 x10 3/mm (ref 1.0–3.6)
Lymphocyte %: 44.8 %
MCH: 29.7 pg (ref 26.0–34.0)
MCHC: 31 g/dL — AB (ref 32.0–36.0)
MCV: 96 fL (ref 80–100)
Monocyte #: 0.2 x10 3/mm (ref 0.2–0.9)
Monocyte %: 3.7 %
Neutrophil #: 2.3 x10 3/mm (ref 1.4–6.5)
Neutrophil %: 46.9 %
PLATELETS: 216 x10 3/mm (ref 150–440)
RBC: 3.62 10*6/uL — ABNORMAL LOW (ref 3.80–5.20)
RDW: 17.4 % — AB (ref 11.5–14.5)
WBC: 4.8 x10 3/mm (ref 3.6–11.0)

## 2014-09-06 ENCOUNTER — Other Ambulatory Visit: Payer: Self-pay

## 2014-09-06 ENCOUNTER — Inpatient Hospital Stay
Admission: EM | Admit: 2014-09-06 | Discharge: 2014-09-08 | DRG: 866 | Disposition: A | Discharge status: Home / Self Care | Payer: Medicare Other | Attending: Internal Medicine | Admitting: Internal Medicine

## 2014-09-06 DIAGNOSIS — Z79899 Other long term (current) drug therapy: Secondary | ICD-10-CM | POA: Diagnosis not present

## 2014-09-06 DIAGNOSIS — K219 Gastro-esophageal reflux disease without esophagitis: Secondary | ICD-10-CM | POA: Diagnosis present

## 2014-09-06 DIAGNOSIS — R6510 Systemic inflammatory response syndrome (SIRS) of non-infectious origin without acute organ dysfunction: Secondary | ICD-10-CM

## 2014-09-06 DIAGNOSIS — Z90710 Acquired absence of both cervix and uterus: Secondary | ICD-10-CM | POA: Diagnosis not present

## 2014-09-06 DIAGNOSIS — B349 Viral infection, unspecified: Principal | ICD-10-CM | POA: Diagnosis present

## 2014-09-06 DIAGNOSIS — D899 Disorder involving the immune mechanism, unspecified: Secondary | ICD-10-CM | POA: Diagnosis present

## 2014-09-06 DIAGNOSIS — I2510 Atherosclerotic heart disease of native coronary artery without angina pectoris: Secondary | ICD-10-CM | POA: Diagnosis present

## 2014-09-06 DIAGNOSIS — Z886 Allergy status to analgesic agent status: Secondary | ICD-10-CM | POA: Diagnosis not present

## 2014-09-06 DIAGNOSIS — W57XXXA Bitten or stung by nonvenomous insect and other nonvenomous arthropods, initial encounter: Secondary | ICD-10-CM | POA: Diagnosis present

## 2014-09-06 DIAGNOSIS — Z853 Personal history of malignant neoplasm of breast: Secondary | ICD-10-CM | POA: Diagnosis not present

## 2014-09-06 DIAGNOSIS — Z8614 Personal history of Methicillin resistant Staphylococcus aureus infection: Secondary | ICD-10-CM

## 2014-09-06 DIAGNOSIS — M1990 Unspecified osteoarthritis, unspecified site: Secondary | ICD-10-CM | POA: Diagnosis present

## 2014-09-06 DIAGNOSIS — Z993 Dependence on wheelchair: Secondary | ICD-10-CM

## 2014-09-06 DIAGNOSIS — Z87891 Personal history of nicotine dependence: Secondary | ICD-10-CM

## 2014-09-06 DIAGNOSIS — Z955 Presence of coronary angioplasty implant and graft: Secondary | ICD-10-CM

## 2014-09-06 DIAGNOSIS — R5081 Fever presenting with conditions classified elsewhere: Secondary | ICD-10-CM

## 2014-09-06 DIAGNOSIS — C7800 Secondary malignant neoplasm of unspecified lung: Secondary | ICD-10-CM | POA: Diagnosis present

## 2014-09-06 DIAGNOSIS — G894 Chronic pain syndrome: Secondary | ICD-10-CM | POA: Diagnosis present

## 2014-09-06 DIAGNOSIS — E039 Hypothyroidism, unspecified: Secondary | ICD-10-CM | POA: Diagnosis present

## 2014-09-06 DIAGNOSIS — Z9049 Acquired absence of other specified parts of digestive tract: Secondary | ICD-10-CM | POA: Diagnosis present

## 2014-09-06 DIAGNOSIS — I10 Essential (primary) hypertension: Secondary | ICD-10-CM | POA: Diagnosis present

## 2014-09-06 DIAGNOSIS — Z9011 Acquired absence of right breast and nipple: Secondary | ICD-10-CM | POA: Diagnosis present

## 2014-09-06 DIAGNOSIS — R509 Fever, unspecified: Secondary | ICD-10-CM | POA: Diagnosis present

## 2014-09-06 DIAGNOSIS — A419 Sepsis, unspecified organism: Secondary | ICD-10-CM | POA: Diagnosis present

## 2014-09-06 DIAGNOSIS — I252 Old myocardial infarction: Secondary | ICD-10-CM | POA: Diagnosis not present

## 2014-09-06 DIAGNOSIS — Z809 Family history of malignant neoplasm, unspecified: Secondary | ICD-10-CM | POA: Diagnosis not present

## 2014-09-06 DIAGNOSIS — Z8249 Family history of ischemic heart disease and other diseases of the circulatory system: Secondary | ICD-10-CM | POA: Diagnosis not present

## 2014-09-06 DIAGNOSIS — Z888 Allergy status to other drugs, medicaments and biological substances status: Secondary | ICD-10-CM

## 2014-09-06 LAB — COMPREHENSIVE METABOLIC PANEL
ALK PHOS: 87 U/L
Albumin: 3.5 g/dL
Anion Gap: 5 — ABNORMAL LOW (ref 7–16)
BILIRUBIN TOTAL: 0.6 mg/dL
BUN: 12 mg/dL
CO2: 27 mmol/L
CREATININE: 0.63 mg/dL
Calcium, Total: 8.3 mg/dL — ABNORMAL LOW
Chloride: 102 mmol/L
EGFR (African American): >60
Glucose: 95 mg/dL
Potassium: 3.8 mmol/L
SGOT(AST): 15 U/L
SGPT (ALT): 9 U/L — ABNORMAL LOW
Sodium: 134 mmol/L — ABNORMAL LOW
TOTAL PROTEIN: 6.1 g/dL — AB

## 2014-09-06 LAB — URINALYSIS, COMPLETE
Bacteria: NONE SEEN
Bilirubin,UR: NEGATIVE
Blood: NEGATIVE
GLUCOSE, UR: NEGATIVE mg/dL (ref 0–75)
Ketone: NEGATIVE
Leukocyte Esterase: NEGATIVE
NITRITE: NEGATIVE
Ph: 5 (ref 4.5–8.0)
Protein: NEGATIVE
SPECIFIC GRAVITY: 1.01 (ref 1.003–1.030)

## 2014-09-06 LAB — CBC WITH DIFFERENTIAL/PLATELET
BASOS ABS: 0 10*3/uL (ref 0.0–0.1)
Basophil %: 0.2 %
EOS ABS: 0 10*3/uL (ref 0.0–0.7)
Eosinophil %: 0.7 %
HCT: 31.8 % — AB (ref 35.0–47.0)
HGB: 10.2 g/dL — AB (ref 12.0–16.0)
LYMPHS PCT: 14.5 %
Lymphocyte #: 0.7 10*3/uL — ABNORMAL LOW (ref 1.0–3.6)
MCH: 30.5 pg (ref 26.0–34.0)
MCHC: 32.2 g/dL (ref 32.0–36.0)
MCV: 95 fL (ref 80–100)
Monocyte #: 0.1 x10 3/mm — ABNORMAL LOW (ref 0.2–0.9)
Monocyte %: 2.9 %
NEUTROS PCT: 81.7 %
Neutrophil #: 3.7 10*3/uL (ref 1.4–6.5)
Platelet: 145 10*3/uL — ABNORMAL LOW (ref 150–440)
RBC: 3.36 10*6/uL — AB (ref 3.80–5.20)
RDW: 17.7 % — AB (ref 11.5–14.5)
WBC: 4.5 10*3/uL (ref 3.6–11.0)

## 2014-09-06 LAB — TROPONIN I: Troponin-I: <0.03 ng/mL

## 2014-09-06 LAB — ED INFLUENZA
INFLBPCR: NEGATIVE
Influenza A By PCR: NEGATIVE

## 2014-09-06 LAB — LACTIC ACID, PLASMA: Lactic Acid, Venous: 1.1 mmol/L

## 2014-09-06 MED ORDER — ATENOLOL 25 MG PO TABS
12.5000 mg | ORAL_TABLET | Freq: Every day | ORAL | Status: DC
  Administered 2014-09-07 – 2014-09-08 (×2): 12.5 mg via ORAL
  Filled 2014-09-06 (×2): qty 1

## 2014-09-06 MED ORDER — ASPIRIN EC 81 MG PO TBEC
81.0000 mg | DELAYED_RELEASE_TABLET | Freq: Every day | ORAL | Status: DC
  Administered 2014-09-08: 12:00:00 81 mg via ORAL
  Filled 2014-09-06: qty 1

## 2014-09-06 MED ORDER — PIPERACILLIN-TAZOBACTAM 3.375 G IVPB
3.3750 g | Freq: Three times a day (TID) | INTRAVENOUS | Status: DC
  Administered 2014-09-07 – 2014-09-08 (×4): 3.375 g via INTRAVENOUS
  Filled 2014-09-06 (×8): qty 50

## 2014-09-06 MED ORDER — EXEMESTANE 25 MG PO TABS
25.0000 mg | ORAL_TABLET | Freq: Every day | ORAL | Status: DC
  Administered 2014-09-07 – 2014-09-08 (×2): 25 mg via ORAL
  Filled 2014-09-06 (×4): qty 1

## 2014-09-06 MED ORDER — BUSPIRONE HCL 5 MG PO TABS
5.0000 mg | ORAL_TABLET | Freq: Three times a day (TID) | ORAL | Status: DC
  Administered 2014-09-07 – 2014-09-08 (×4): 5 mg via ORAL
  Filled 2014-09-06 (×8): qty 1

## 2014-09-06 MED ORDER — SODIUM CHLORIDE 0.9 % IJ SOLN
3.0000 mL | Freq: Four times a day (QID) | INTRAMUSCULAR | Status: DC
  Administered 2014-09-07 – 2014-09-08 (×5): 3 mL via INTRAVENOUS

## 2014-09-06 MED ORDER — PANTOPRAZOLE SODIUM 40 MG PO TBEC
40.0000 mg | DELAYED_RELEASE_TABLET | Freq: Every day | ORAL | Status: DC
  Administered 2014-09-07 – 2014-09-08 (×2): 40 mg via ORAL
  Filled 2014-09-06: qty 1

## 2014-09-06 MED ORDER — SODIUM CHLORIDE 0.9 % IJ SOLN
3.0000 mL | Freq: Four times a day (QID) | INTRAMUSCULAR | Status: DC
  Administered 2014-09-07 – 2014-09-08 (×5): 3 mL via INTRAVENOUS

## 2014-09-06 MED ORDER — MOMETASONE FURO-FORMOTEROL FUM 100-5 MCG/ACT IN AERO
2.0000 | INHALATION_SPRAY | Freq: Two times a day (BID) | RESPIRATORY_TRACT | Status: DC
  Administered 2014-09-07 – 2014-09-08 (×3): 2 via RESPIRATORY_TRACT
  Filled 2014-09-06 (×2): qty 8.8

## 2014-09-06 MED ORDER — LEVOTHYROXINE SODIUM 50 MCG PO TABS
50.0000 ug | ORAL_TABLET | Freq: Every day | ORAL | Status: DC
  Administered 2014-09-07 – 2014-09-08 (×2): 50 ug via ORAL
  Filled 2014-09-06 (×2): qty 1

## 2014-09-06 MED ORDER — ONDANSETRON HCL 4 MG/2ML IJ SOLN: 4.0000 mg | INTRAMUSCULAR | Status: DC | PRN

## 2014-09-06 MED ORDER — DULOXETINE HCL 60 MG PO CPEP
90.0000 mg | ORAL_CAPSULE | Freq: Every day | ORAL | Status: DC
  Administered 2014-09-07 – 2014-09-08 (×2): 90 mg via ORAL
  Filled 2014-09-06 (×3): qty 1
  Filled 2014-09-06: qty 2
  Filled 2014-09-06: qty 1

## 2014-09-06 MED ORDER — ACETAMINOPHEN 325 MG PO TABS
650.0000 mg | ORAL_TABLET | ORAL | Status: DC | PRN
  Administered 2014-09-08: 06:00:00 650 mg via ORAL
  Filled 2014-09-06: qty 2

## 2014-09-06 MED ORDER — ENOXAPARIN SODIUM 40 MG/0.4ML ~~LOC~~ SOLN
40.0000 mg | SUBCUTANEOUS | Status: DC
  Administered 2014-09-07: 21:00:00 40 mg via SUBCUTANEOUS
  Filled 2014-09-06: qty 0.4

## 2014-09-06 MED ORDER — VANCOMYCIN HCL 10 G IV SOLR
1250.0000 mg | Freq: Two times a day (BID) | INTRAVENOUS | Status: DC
  Administered 2014-09-07 (×2): 1250 mg via INTRAVENOUS
  Filled 2014-09-06 (×4): qty 1250

## 2014-09-06 MED ORDER — ALBUTEROL SULFATE (2.5 MG/3ML) 0.083% IN NEBU
2.5000 mg | INHALATION_SOLUTION | RESPIRATORY_TRACT | Status: DC
  Administered 2014-09-07: 08:00:00 2.5 mg via RESPIRATORY_TRACT
  Filled 2014-09-06: qty 3

## 2014-09-06 MED ORDER — GABAPENTIN 300 MG PO CAPS
300.0000 mg | ORAL_CAPSULE | Freq: Three times a day (TID) | ORAL | Status: DC
  Administered 2014-09-07 – 2014-09-08 (×4): 300 mg via ORAL
  Filled 2014-09-06 (×4): qty 1

## 2014-09-07 DIAGNOSIS — R5081 Fever presenting with conditions classified elsewhere: Secondary | ICD-10-CM

## 2014-09-07 DIAGNOSIS — C50919 Malignant neoplasm of unspecified site of unspecified female breast: Secondary | ICD-10-CM | POA: Insufficient documentation

## 2014-09-07 DIAGNOSIS — C7800 Secondary malignant neoplasm of unspecified lung: Secondary | ICD-10-CM

## 2014-09-07 DIAGNOSIS — R6510 Systemic inflammatory response syndrome (SIRS) of non-infectious origin without acute organ dysfunction: Secondary | ICD-10-CM

## 2014-09-07 HISTORY — DX: Malignant neoplasm of unspecified site of unspecified female breast: C50.919

## 2014-09-07 LAB — CBC WITH DIFFERENTIAL/PLATELET
Basophils Absolute: 0 10*3/uL (ref 0–0.1)
Eosinophils Absolute: 0 10*3/uL (ref 0–0.7)
Eosinophils Relative: 1 %
HEMATOCRIT: 29.6 % — AB (ref 35.0–47.0)
HEMOGLOBIN: 9.4 g/dL — AB (ref 12.0–16.0)
Lymphocytes Relative: 26 %
Lymphs Abs: 1.4 10*3/uL (ref 1.0–3.6)
MCH: 30.4 pg (ref 26.0–34.0)
MCHC: 31.9 g/dL — AB (ref 32.0–36.0)
MCV: 95.4 fL (ref 80.0–100.0)
Monocytes Absolute: 0.1 10*3/uL — ABNORMAL LOW (ref 0.2–0.9)
Monocytes Relative: 3 %
NEUTROS ABS: 3.7 10*3/uL (ref 1.4–6.5)
Neutrophils Relative %: 70 %
Platelets: 136 10*3/uL — ABNORMAL LOW (ref 150–440)
RBC: 3.1 MIL/uL — ABNORMAL LOW (ref 3.80–5.20)
RDW: 18.2 % — ABNORMAL HIGH (ref 11.5–14.5)
WBC: 5.2 10*3/uL (ref 3.6–11.0)

## 2014-09-07 MED ORDER — ALBUTEROL SULFATE (2.5 MG/3ML) 0.083% IN NEBU
2.5000 mg | INHALATION_SOLUTION | Freq: Four times a day (QID) | RESPIRATORY_TRACT | Status: DC
  Administered 2014-09-07: 2.5 mg via RESPIRATORY_TRACT
  Filled 2014-09-07 (×2): qty 3

## 2014-09-07 MED ORDER — VANCOMYCIN HCL 10 G IV SOLR
1250.0000 mg | INTRAVENOUS | Status: DC
  Administered 2014-09-08: 03:00:00 1250 mg via INTRAVENOUS
  Filled 2014-09-07 (×2): qty 1250

## 2014-09-07 MED ORDER — DIPHENHYDRAMINE HCL 25 MG PO CAPS
25.0000 mg | ORAL_CAPSULE | Freq: Once | ORAL | Status: AC
  Administered 2014-09-07: 15:00:00 25 mg via ORAL
  Filled 2014-09-07: qty 1

## 2014-09-07 MED ORDER — ETHYL CHLORIDE EX AERO
INHALATION_SPRAY | CUTANEOUS | Status: DC | PRN
  Filled 2014-09-07: qty 103.5

## 2014-09-07 MED ORDER — METHADONE HCL 10 MG/ML PO CONC
108.0000 mg | Freq: Every day | ORAL | Status: DC
  Administered 2014-09-07 – 2014-09-08 (×2): 108 mg via ORAL
  Filled 2014-09-07 (×3): qty 10.8

## 2014-09-07 MED ORDER — ALPRAZOLAM 0.25 MG PO TABS
0.2500 mg | ORAL_TABLET | Freq: Three times a day (TID) | ORAL | Status: DC | PRN
  Administered 2014-09-07: 0.25 mg via ORAL
  Filled 2014-09-07: qty 1

## 2014-09-07 MED ORDER — DIPHENHYDRAMINE HCL 25 MG PO CAPS
25.0000 mg | ORAL_CAPSULE | Freq: Every evening | ORAL | Status: DC | PRN
  Administered 2014-09-07: 25 mg via ORAL
  Filled 2014-09-07: qty 1

## 2014-09-07 MED ORDER — SODIUM CHLORIDE 0.9 % IJ SOLN
5.0000 mL | Freq: Every day | INTRAMUSCULAR | Status: DC
  Administered 2014-09-07 – 2014-09-08 (×2): 5 mL via INTRAVENOUS

## 2014-09-07 MED ORDER — SODIUM CHLORIDE 0.9 % IJ SOLN
5.0000 mL | INTRAMUSCULAR | Status: DC | PRN
Start: 2014-09-06 — End: 2014-09-08

## 2014-09-07 MED ORDER — DIPHENHYDRAMINE HCL 25 MG PO CAPS
ORAL_CAPSULE | ORAL | Status: AC
  Administered 2014-09-07: 25 mg via ORAL
  Filled 2014-09-07: qty 1

## 2014-09-07 MED ORDER — NICOTINE 10 MG IN INHA
1.0000 | RESPIRATORY_TRACT | Status: DC | PRN
  Administered 2014-09-07 – 2014-09-08 (×3): 1 via RESPIRATORY_TRACT
  Filled 2014-09-07 (×4): qty 33

## 2014-09-07 MED ORDER — ALBUTEROL SULFATE (2.5 MG/3ML) 0.083% IN NEBU: 2.5000 mg | INHALATION_SOLUTION | Freq: Four times a day (QID) | RESPIRATORY_TRACT | Status: DC | PRN

## 2014-09-07 NOTE — Progress Notes (Signed)
ANTIBIOTIC CONSULT NOTE - INITIAL  Pharmacy Consult for Zosyn and Vancomycin Dosing Indication: rule out sepsis  Allergies  Allergen Reactions  . Ibuprofen Other (See Comments)    GI Distress  . Prilosec [Omeprazole] Other (See Comments)    Jerky Movements    Patient Measurements: Height: 5\' 4"  (162.6 cm) Weight: 115 lb (52.164 kg) IBW/kg (Calculated) : 54.7   Vital Signs: Temp: 98.7 F (37.1 C) (05/01 1316) Temp Source: Oral (05/01 1316) BP: 106/48 mmHg (05/01 1316) Pulse Rate: 93 (05/01 1316)  Labs:  Recent Labs  09/06/14 1958 09/07/14 0420  WBC 4.5 5.2  HGB 10.2* 9.4*  PLT 145* 136*  CREATININE 0.63  --    Estimated Creatinine Clearance: 57 mL/min (by C-G formula based on Cr of 0.63).   Microbiology: Recent Results (from the past 720 hour(s))  ED Influenza     Status: None   Collection Time: 09/06/14  7:49 PM  Result Value Ref Range Status   Influenza A By PCR NEGATIVE FOR INFLUENZA A (ANTIGEN ABSENT)  Final    Comment: A negative result does not exclude influenza. Correlation with clinical impression is required.    Influenza B By PCR NEGATIVE FOR INFLUENZA B (ANTIGEN ABSENT)  Final    Medical History: No past medical history on file.  Medications:  Scheduled:  . albuterol  2.5 mg Nebulization Q6H WA  . aspirin EC  81 mg Oral Daily  . atenolol  12.5 mg Oral Daily  . busPIRone  5 mg Oral TID  . DULoxetine  90 mg Oral Daily  . enoxaparin (LOVENOX) injection  40 mg Subcutaneous Q24H  . exemestane  25 mg Oral QPC breakfast  . gabapentin  300 mg Oral TID  . levothyroxine  50 mcg Oral QAC breakfast  . methadone  108 mg Oral Daily  . mometasone-formoterol  2 puff Inhalation BID  . pantoprazole  40 mg Oral Q0600  . piperacillin-tazobactam (ZOSYN)  IV  3.375 g Intravenous Q8H  . sodium chloride  3 mL Intravenous Q6H  . sodium chloride  3 mL Intravenous 4 times per day  . sodium chloride  5 mL Intravenous Daily  . [START ON 09/08/2014] vancomycin   1,250 mg Intravenous Q24H   Assessment: Blood cultures pending.  Rule out sepsis/pneumonia.  Patient with history of MRSA osteomyelitis. Currently ordered Vancomycin 1250 mg IV q12h and Zosyn 3.375 gm IV q8h.   Kinetics: SCr: 0.63 (0.8), est CrCl~57 mL/min, ke: 0.052, t1/2: 13.3 h, Vd: 37.8 L   Goal of Therapy:  Vancomycin trough level 15-20 mcg/ml.   Plan:  Follow up culture results  Based on above parameters, will transition patient to Vancomycin 1250 mg IV q24h.  Will check trough prior to dose on 5/3 at 0130.  Will continue current orders for Zosyn 3.375 gm IV q8h per EI protocol.   Pharmacy will continue to follow.  Murrell Converse, PharmD Clinical Pharmacist 09/07/2014

## 2014-09-07 NOTE — Progress Notes (Signed)
Patient Demographics  Betty Edwards, is a 69 y.o. female, DOB - 09/29/1945, ZJQ:[id number]  Admit date - 09/06/2014   Admitting Physician Arlyss Queen, MD  Outpatient Primary MD for the patient is No primary care provider on file.  LOS - 1   No chief complaint on file.       Subjective:   Joylene Draft today has, No headache, No chest pain, No abdominal pain - No Nausea, No new weakness tingling or numbness, No Cough - SOB. Urinary symptoms, rashes,  New joint pains, diarrhea  Assessment & Plan    Principal Problem:  *  SIRS (systemic inflammatory response syndrome)        On IV abx- empirically- BC collected- have medport- no signs of infection around it.        UA, Xray chest- negative.        No abdominal or skin findings of infecion.        Had tick bite last week- no sking changes , or new joint pains.- [facility] fever IGM sent. Active Problems:   * Hypertension- atenolol- controlled.    * Lung cancer- she follows with Dr. [name]- last chemo- last week. Continue to follow. *  Chronic pain syndrome,      stable on chronic pain medications: metahdone, Continue same.  *  History of coronary artery disease/myocardial infarction,     status post stents, stable. No acute problems:  Monitor and continue home medications.  *  Tobacco usage, continuous:  Counseled to quit for 4 min. The patient is not motivated.  *  Hypothyroidism, stable on home medications:  Continue same.   Code Status: full code  Family Communication: none.  Disposition Plan: home, when stable.  Procedures none.  Consults none.   Medications  Scheduled Meds: . albuterol  2.5 mg Nebulization Q6H WA  . aspirin EC  81 mg Oral Daily  . atenolol  12.5 mg Oral Daily  . busPIRone  5 mg Oral TID  . DULoxetine  90 mg Oral Daily  . enoxaparin (LOVENOX)  injection  40 mg Subcutaneous Q24H  . exemestane  25 mg Oral QPC breakfast  . gabapentin  300 mg Oral TID  . levothyroxine  50 mcg Oral QAC breakfast  . methadone  108 mg Oral Daily  . mometasone-formoterol  2 puff Inhalation BID  . pantoprazole  40 mg Oral Q0600  . piperacillin-tazobactam (ZOSYN)  IV  3.375 g Intravenous Q8H  . sodium chloride  3 mL Intravenous Q6H  . sodium chloride  3 mL Intravenous 4 times per day  . sodium chloride  5 mL Intravenous Daily  . vancomycin  1,250 mg Intravenous Q12H   Continuous Infusions:  PRN Meds:.acetaminophen, ethyl chloride, nicotine, ondansetron (ZOFRAN) IV, sodium chloride  DVT Prophylaxis  Lovenox  Lab Results  Component Value Date   PLT 136* 09/07/2014    Antibiotics   Anti-infectives    Start     Dose/Rate Route Frequency Ordered Stop   09/07/14 0300  piperacillin-tazobactam (ZOSYN) IVPB 3.375 g     3.375 g 12.5 mL/hr over 240 Minutes Intravenous Every 8 hours 09/06/14 2211     09/07/14 0200  vancomycin (VANCOCIN) 1,250 mg in sodium chloride 0.9 % 250 mL IVPB     1,250 mg 166.7 mL/hr over 90 Minutes Intravenous Every 12 hours 09/06/14 2211            Objective:   Filed Vitals:   09/06/14 2258 09/07/14 2409 09/07/14 7353  09/07/14 0840  BP:  100/52  103/63  Pulse:  100 89 107  Temp:  98.1 F (36.7 C)  99.7 F (37.6 C)  TempSrc:  Oral  Oral  Resp:  18 16   Height: 5\' 4"  (1.626 m)     Weight: 115 lb (52.164 kg)     SpO2:  96% 94% 99%    Wt Readings from Last 3 Encounters:  09/06/14 115 lb (52.164 kg)     Intake/Output Summary (Last 24 hours) at 09/07/14 1152 Last data filed at 09/07/14 1034  Gross per 24 hour  Intake    222 ml  Output    600 ml  Net   -378 ml     Physical Exam  Awake Alert, Oriented X 3, No new F.N deficits, Normal affect PERRAL Supple Neck,No JVD, No cervical lymphadenopathy appriciated.  Symmetrical Chest wall movement, Good air movement bilaterally, no creps or wheeze. RRR,No  Gallops,Rubs or new Murmurs,  +ve B.Sounds, Abd Soft, No tenderness, No organomegaly appriciated, No rebound - guarding or rigidity. No Cyanosis, Clubbing or edema, No new Rash or bruise Joints deformed No edema on legs. Neurological- Power 4/5 , no tremors / rigidity.   Data Review   Micro Results Recent Results (from the past 240 hour(s))  ED Influenza     Status: None   Collection Time: 09/06/14  7:49 PM  Result Value Ref Range Status   Influenza A By PCR NEGATIVE FOR INFLUENZA A (ANTIGEN ABSENT)  Final    Comment: A negative result does not exclude influenza. Correlation with clinical impression is required.    Influenza B By PCR NEGATIVE FOR INFLUENZA B (ANTIGEN ABSENT)  Final    Radiology Reports Dg Chest Port 1 View  09/06/2014   CLINICAL DATA:  Shortness of breath  EXAM: PORTABLE CHEST - 1 VIEW  COMPARISON:  Chest radiograph dated 08/01/2014. CT chest dated 06/11/2014.  FINDINGS: Patient is rotated.  Nodules/masses at the lateral right lung base, better evaluated on prior CT. Lungs otherwise clear. No focal consolidation. No pleural effusion or pneumothorax.  Cardiomegaly.  Right chest power port terminates at the cavoatrial junction.  IMPRESSION: Nodules/masses at the lateral right lung base, better evaluated on prior CT.  Otherwise, no evidence of acute cardiopulmonary disease.   Electronically Signed   By: Julian Hy M.D.   On: 09/06/2014 19:49     CBC  Recent Labs Lab 09/03/14 0821 09/06/14 1958 09/07/14 0420  WBC 4.8 4.5 5.2  HGB 10.8* 10.2* 9.4*  HCT 34.7* 31.8* 29.6*  PLT 216 145* 136*  MCV 96 95 95.4  MCH 29.7 30.5 30.4  MCHC 31.0* 32.2 31.9*  RDW 17.4* 17.7* 18.2*  LYMPHSABS 2.2 0.7* 1.4  MONOABS 0.2 0.1* 0.1*  EOSABS 0.2 0.0 0.0  BASOSABS 0.0 0.0 0.0    Chemistries   Recent Labs Lab 09/06/14 1958  CO2 27  BUN 12  CREATININE 0.63  CALCIUM 8.3*  AST 15  ALT 9*  ALKPHOS 87    ------------------------------------------------------------------------------------------------------------------ estimated creatinine clearance is 57 mL/min (by C-G formula based on Cr of 0.63). ------------------------------------------------------------------------------------------------------------------ No results for input(s): HGBA1C in the last 72 hours. ------------------------------------------------------------------------------------------------------------------ No results for input(s): CHOL, HDL, LDLCALC, TRIG, CHOLHDL, LDLDIRECT in the last 72 hours. ------------------------------------------------------------------------------------------------------------------ No results for input(s): TSH, T4TOTAL, T3FREE, THYROIDAB in the last 72 hours.  Invalid input(s): FREET3 ------------------------------------------------------------------------------------------------------------------ No results for input(s): VITAMINB12, FOLATE, FERRITIN, TIBC, IRON, RETICCTPCT in the last 72 hours.  Coagulation profile No results for input(s): INR,  PROTIME in the last 168 hours.  No results for input(s): DDIMER in the last 72 hours.  Cardiac Enzymes No results for input(s): CKMB, TROPONINI, MYOGLOBIN in the last 168 hours.  Invalid input(s): CK ------------------------------------------------------------------------------------------------------------------ Invalid input(s): POCBNP     Time Spent in minutes  35  Vaughan Basta M.D on 09/07/2014 at 11:52 AM  Between 7am to 7pm - Pager - [phone] After 7pm go to [email or web address] - password [facility]   Office  [phone]

## 2014-09-07 NOTE — Plan of Care (Signed)
Problem: Discharge Progression Outcomes Goal: Discharge plan in place and appropriate Individualization of care  Outcome: Progressing 1: likes to be called Betty Edwards  2: lives at home with son  3: history of lung and breast cancer with right mastectomy  4: please update when h & P available  Goal: Other Discharge Outcomes/Goals Plan of care progress to goal: pt admitted with infection and fever of unknown source, on IV antibiotics as ordered per md, afebrile since admission

## 2014-09-07 NOTE — Progress Notes (Signed)
For prior documentation please see [facility] medical record number [id number]

## 2014-09-07 NOTE — Progress Notes (Signed)
   09/07/14 1100  Clinical Encounter Type  Visited With Patient  Visit Type Initial (Spiritual Care Consult for Advance Directive)  Referral From Nurse  Consult/Referral To Chaplain  Spiritual Encounters  Spiritual Needs Literature (Advance Directive literature)  Stress Factors  Patient Stress Factors None identified  Family Stress Factors None identified  Advance Directives (For Healthcare)  Does patient have an advance directive? No  Would patient like information on creating an advanced directive? Yes - Scientist, clinical (histocompatibility and immunogenetics) given  I took Forensic scientist literature to patient and explained it to her.  Patient indicated that she does not wish to complete Advance Directive during this hospitalization. [place] pager number [phone]   Pastoral Care [phone]

## 2014-09-07 NOTE — Op Note (Signed)
PATIENT NAME:  Betty Edwards, Betty Edwards MR#:  [id number] DATE OF BIRTH:  Jul 14, 1945  DATE OF PROCEDURE:  08/01/2014  PREOPERATIVE DIAGNOSIS: Need for Port-A-Cath. Stage 4 Breast Cancer  POSTOPERATIVE DIAGNOSIS: Need for Port-A-Cath. Stage 4 Breast Cancer  PROCEDURE PERFORMED: Right internal jugular Infuse-a-Port placement.   SURGEON: Sherri Rad, MD.   ASSISTANT: None.   ANESTHESIA: General.   FINDINGS:  No PVCs. Chest x-ray pending.   DESCRIPTION OF PROCEDURE: Informed consent, supine position, sterile prep and drape, Trendelenburg position. Timeout was observed. Utilizing the handheld sterilely prepped ultrasound probe internal jugular vein was cannulated on first pass with a 22-gauge finder needle. Larger bore catheter needle was placed. Wire was passed. Fluoroscopy demonstrated right sided chambers of the heart. A subcutaneous pocket was then fashioned on the anterior chest wall through a transverse skin incision. Skin incision was fashioned over the wire. Tunneling of the catheter was then performed. Peel-away introducer and dilator were placed. Catheter was then shortened to the appropriate length utilizing real time fluoroscopy. It flushed and aspirated without difficulty. It was assembled to the reservoir. The reservoir was inserted and secured. Wound was then closed with 3-0 Vicryl and 4-0 Vicryl subcuticular in the skin edges along with 4-0 nylon. Sterile dressings were then applied.   Telfa and Tegaderm were then applied.    ____________________________ Jeannette How Marina Gravel, MD mab:bu D: 08/01/2014 11:26:44 ET T: 08/01/2014 14:57:48 ET JOB#: [id number]  cc: Elta Guadeloupe A. Marina Gravel, MD, <Dictator> Hortencia Conradi MD ELECTRONICALLY SIGNED 08/04/2014 23:14

## 2014-09-07 NOTE — Plan of Care (Signed)
Problem: Discharge Progression Outcomes Goal: Pain controlled with appropriate interventions Outcome: Adequate for Discharge No c/o pain  Goal: Other Discharge Outcomes/Goals Outcome: Progressing Progress toward goal Sepsis :  IV ABX infusing as ordered, patient has a slight fever today that resolved  Pain no c/o pain,BP diastolic 83-46 MD aware Advance to Regular tolerated well  Activity unknown awaiting PT eval

## 2014-09-07 NOTE — Progress Notes (Addendum)
Spoke to dr Jannifer Franklin about insomnia per pt request  md order for benadryl Erline Levine, RN

## 2014-09-08 DIAGNOSIS — R509 Fever, unspecified: Secondary | ICD-10-CM | POA: Diagnosis present

## 2014-09-08 LAB — CBC
HEMATOCRIT: 26.1 % — AB (ref 35.0–47.0)
HEMOGLOBIN: 8.4 g/dL — AB (ref 12.0–16.0)
MCH: 30.9 pg (ref 26.0–34.0)
MCHC: 32.2 g/dL (ref 32.0–36.0)
MCV: 95.9 fL (ref 80.0–100.0)
PLATELETS: 132 10*3/uL — AB (ref 150–440)
RBC: 2.72 MIL/uL — ABNORMAL LOW (ref 3.80–5.20)
RDW: 18.1 % — ABNORMAL HIGH (ref 11.5–14.5)
WBC: 3.1 10*3/uL — ABNORMAL LOW (ref 3.6–11.0)

## 2014-09-08 LAB — BASIC METABOLIC PANEL
ANION GAP: 5 (ref 5–15)
BUN: 13 mg/dL (ref 6–20)
CALCIUM: 8.2 mg/dL — AB (ref 8.9–10.3)
CHLORIDE: 107 mmol/L (ref 101–111)
CO2: 26 mmol/L (ref 22–32)
Creatinine, Ser: 0.56 mg/dL (ref 0.44–1.00)
GLUCOSE: 98 mg/dL (ref 65–99)
POTASSIUM: 3.3 mmol/L — AB (ref 3.5–5.1)
SODIUM: 138 mmol/L (ref 135–145)

## 2014-09-08 LAB — CULTURE, BLOOD (SINGLE)

## 2014-09-08 MED ORDER — DIPHENHYDRAMINE HCL 25 MG PO CAPS
25.0000 mg | ORAL_CAPSULE | Freq: Once | ORAL | Status: AC
  Administered 2014-09-08: 25 mg via ORAL

## 2014-09-08 NOTE — Care Management (Signed)
Case Management Note  Patient Details  Name: TENESHA GARZA MRN: [id number] Date of Birth: 06/09/1945  Subjective/Objective:                   Admitted to [facility] with the diagnosis of SIRS. Lives with son, Jerrol Banana [phone]). Personnal Care services 3hours/day 5-7days a week. [place] about 2 years ago.  Wheelchair/bedbound. Sees Dr Luan Pulling. Last seen 2 weeks ago. Sees Dr. [name]. Last seem a week ago for chemotherapy.  Requested to be transported to home per EMS, when discharged.  Action/Plan:  Discharge to home with personnal care services in place Expected Discharge Date:                    Expected Discharge Plan:     In-House Referral:     Discharge planning Services     Post Acute Care Choice:    Choice offered to:     DME Arranged:    DME Agency:     HH Arranged:    [place] Agency:     Status of Service:     Medicare Important Message Given:    Date Medicare IM Given:    Medicare IM give by:    Date Additional Medicare IM Given:    Additional Medicare Important Message give by:     If discussed at [place] of Stay Meetings, dates discussed:    Additional Comments:  [name] 09/08/2014, 9:30 AM

## 2014-09-08 NOTE — Progress Notes (Signed)
No c/o pain this shift, VSS, received MD order to discharge patient to home via EMS

## 2014-09-08 NOTE — Progress Notes (Signed)
Patient Demographics  Betty Edwards, is a 69 y.o. female, DOB - 06-21-1945, JQZ:[id number]  Admit date - 09/06/2014   Admitting Physician Arlyss Queen, MD  Outpatient Primary MD for the patient is No primary care provider on file.  LOS - 2     Subjective:   Betty Edwards today has, No headache, No chest pain, No abdominal pain - No Nausea, No new weakness tingling or numbness, No Cough - SOB. Urinary symptoms, rashes,  New joint pains, diarrhea No more fever in last 36 hrs. Assessment & Plan    Principal Problem:  *  SIRS (systemic inflammatory response syndrome)        On IV abx- empirically- BC collected- have medport- no signs of infection around it.          Blood cx negative so far ( as checked in old SCM system- Not getting updated yet in EPIC( new) system)        UA, Xray chest- negative.        No abdominal or skin findings of infecion.        Had tick bite last week- no sking changes , or new joint pains.- [facility] fever IGM sent.        As no more fever- will d/c home today- with No antibiotics.        Have appointment with Cancer center this Wednesday.  Active Problems:   * Hypertension- atenolol- controlled.    * Lung cancer- she follows with Dr. [name]- last chemo- last week. Continue to follow. *  Chronic pain syndrome,      stable on chronic pain medications: metahdone, Continue same.  *  History of coronary artery disease/myocardial infarction,     status post stents, stable. No acute problems:  Monitor and continue home medications.  *  Tobacco usage, continuous:  Counseled to quit for 4 min. The patient is not motivated.  *  Hypothyroidism, stable on home medications:  Continue same.   Code Status: full code  Family Communication: none.  Disposition Plan: home, with home health today. Procedures  none.  Consults none.   Medications  Scheduled Meds: . aspirin EC  81 mg Oral Daily  . atenolol  12.5 mg Oral Daily  . busPIRone  5 mg Oral TID  . DULoxetine  90 mg Oral Daily  . enoxaparin (LOVENOX) injection  40 mg Subcutaneous Q24H  . exemestane  25 mg Oral QPC breakfast  . gabapentin  300 mg Oral TID  . levothyroxine  50 mcg Oral QAC breakfast  . methadone  108 mg Oral Daily  . mometasone-formoterol  2 puff Inhalation BID  . pantoprazole  40 mg Oral Q0600  . piperacillin-tazobactam (ZOSYN)  IV  3.375 g Intravenous Q8H  . sodium chloride  3 mL Intravenous Q6H  . sodium chloride  3 mL Intravenous 4 times per day  . sodium chloride  5 mL Intravenous Daily  . vancomycin  1,250 mg Intravenous Q24H   Continuous Infusions:  PRN Meds:.acetaminophen, albuterol, ALPRAZolam, diphenhydrAMINE, ethyl chloride, nicotine, ondansetron (ZOFRAN) IV, sodium chloride  DVT Prophylaxis  Lovenox  Lab Results  Component Value Date   PLT 132* 09/08/2014    Antibiotics   Anti-infectives    Start     Dose/Rate Route Frequency Ordered Stop   09/08/14 0200  vancomycin (VANCOCIN) 1,250 mg in sodium chloride 0.9 % 250 mL IVPB     1,250 mg 166.7 mL/hr over 90 Minutes Intravenous Every 24 hours 09/07/14  1606     09/07/14 0300  piperacillin-tazobactam (ZOSYN) IVPB 3.375 g     3.375 g 12.5 mL/hr over 240 Minutes Intravenous Every 8 hours 09/06/14 2211     09/07/14 0200  vancomycin (VANCOCIN) 1,250 mg in sodium chloride 0.9 % 250 mL IVPB  Status:  Discontinued     1,250 mg 166.7 mL/hr over 90 Minutes Intravenous Every 12 hours 09/06/14 2211 09/07/14 1606          Objective:   Filed Vitals:   09/08/14 0548 09/08/14 0550 09/08/14 0910 09/08/14 0912  BP:  104/55 105/76 105/76  Pulse:  92 103 103  Temp: 98.3 F (36.8 C) 98.6 F (37 C) 99.3 F (37.4 C) 99.3 F (37.4 C)  TempSrc: Oral Oral Oral Oral  Resp:    18  Height:      Weight:      SpO2:  99% 98% 98%    Wt Readings from Last 3  Encounters:  09/07/14 132 lb 12.8 oz (60.238 kg)     Intake/Output Summary (Last 24 hours) at 09/08/14 1052 Last data filed at 09/08/14 0410  Gross per 24 hour  Intake      0 ml  Output   1450 ml  Net  -1450 ml     Physical Exam  Awake Alert, Oriented X 3, No new F.N deficits, Normal affect PERRAL Supple Neck,No JVD, No cervical lymphadenopathy appriciated.  Symmetrical Chest wall movement, Good air movement bilaterally, no creps or wheeze. RRR,No Gallops,Rubs or new Murmurs,  +ve B.Sounds, Abd Soft, No tenderness, No organomegaly appriciated, No rebound - guarding or rigidity. No Cyanosis, Clubbing or edema, No new Rash or bruise Joints deformed No edema on legs. Neurological- Power 4/5 , no tremors / rigidity. Lower extrimity weakness.   Data Review   Micro Results Recent Results (from the past 240 hour(s))  ED Influenza     Status: None   Collection Time: 09/06/14  7:49 PM  Result Value Ref Range Status   Influenza A By PCR NEGATIVE FOR INFLUENZA A (ANTIGEN ABSENT)  Final    Comment: A negative result does not exclude influenza. Correlation with clinical impression is required.    Influenza B By PCR NEGATIVE FOR INFLUENZA B (ANTIGEN ABSENT)  Final    Radiology Reports Dg Chest Port 1 View  09/06/2014   CLINICAL DATA:  Shortness of breath  EXAM: PORTABLE CHEST - 1 VIEW  COMPARISON:  Chest radiograph dated 08/01/2014. CT chest dated 06/11/2014.  FINDINGS: Patient is rotated.  Nodules/masses at the lateral right lung base, better evaluated on prior CT. Lungs otherwise clear. No focal consolidation. No pleural effusion or pneumothorax.  Cardiomegaly.  Right chest power port terminates at the cavoatrial junction.  IMPRESSION: Nodules/masses at the lateral right lung base, better evaluated on prior CT.  Otherwise, no evidence of acute cardiopulmonary disease.   Electronically Signed   By: Julian Hy M.D.   On: 09/06/2014 19:49     CBC  Recent Labs Lab  09/03/14 0821 09/06/14 1958 09/07/14 0420 09/08/14 0509  WBC 4.8 4.5 5.2 3.1*  HGB 10.8* 10.2* 9.4* 8.4*  HCT 34.7* 31.8* 29.6* 26.1*  PLT 216 145* 136* 132*  MCV 96 95 95.4 95.9  MCH 29.7 30.5 30.4 30.9  MCHC 31.0* 32.2 31.9* 32.2  RDW 17.4* 17.7* 18.2* 18.1*  LYMPHSABS 2.2 0.7* 1.4  --   MONOABS 0.2 0.1* 0.1*  --   EOSABS 0.2 0.0 0.0  --   BASOSABS 0.0 0.0 0.0  --  Chemistries   Recent Labs Lab 09/06/14 1958 09/08/14 0509  NA 134* 138  K 3.8 3.3*  CL 102 107  CO2 27 26  GLUCOSE 95 98  BUN 12 13  CREATININE 0.63 0.56  CALCIUM 8.3* 8.2*  AST 15  --   ALT 9*  --   ALKPHOS 87  --    ------------------------------------------------------------------------------------------------------------------ estimated creatinine clearance is 59.7 mL/min (by C-G formula based on Cr of 0.56). ------------------------------------------------------------------------------------------------------------------ No results for input(s): HGBA1C in the last 72 hours. ------------------------------------------------------------------------------------------------------------------ No results for input(s): CHOL, HDL, LDLCALC, TRIG, CHOLHDL, LDLDIRECT in the last 72 hours. ------------------------------------------------------------------------------------------------------------------ No results for input(s): TSH, T4TOTAL, T3FREE, THYROIDAB in the last 72 hours.  Invalid input(s): FREET3 ------------------------------------------------------------------------------------------------------------------ No results for input(s): VITAMINB12, FOLATE, FERRITIN, TIBC, IRON, RETICCTPCT in the last 72 hours.  Coagulation profile No results for input(s): INR, PROTIME in the last 168 hours.  No results for input(s): DDIMER in the last 72 hours.  Cardiac Enzymes No results for input(s): CKMB, TROPONINI, MYOGLOBIN in the last 168 hours.  Invalid input(s):  CK ------------------------------------------------------------------------------------------------------------------ Invalid input(s): POCBNP     Time Spent in minutes  35  Vaughan Basta M.D on 09/08/2014 at 10:52 AM  Between 7am to 7pm - Pager - [phone] After 7pm go to [email or web address] - password [facility]   Office  [phone]

## 2014-09-08 NOTE — H&P (Signed)
PATIENT NAME:  BRITNAY, MAGNUSSEN MR#:  [id number] DATE OF BIRTH:  Dec 01, 1945  DATE OF ADMISSION:  09/06/2014  Addendum  HOME MEDICATIONS:   1.  Advair Diskus 250/50 mcg 1 puff inhalation twice a day.  2.  Albuterol 90 mcg inhalation 2 puffs every 4 hours as needed for shortness of breath.  3.  Atenolol 25 mg 1/2 tablet orally once a day in the morning.  4.  Buspirone 5 mg oral tablet 1 tablet orally 3 times a day.  5.  Duloxetine 30 mg  capsules,  3 capsules orally once a day in the morning.  6.  Exemestane 25 mg oral tablet 1 tablet orally once a day in the morning.  7.  Gabapentin 300 mg oral capsule, 1 capsule orally 3 times a day.  8.  Levothyroxine 50 mcg 1 tablet orally once a day.  9.  Pantoprazole 20 mg  capsule 2 capsules orally in the morning.     ____________________________ Juluis Mire, MD [phone] D: 09/06/2014 21:44:41 ET T: 09/06/2014 22:01:21 ET JOB#: [id number]  cc: Juluis Mire, MD, <Dictator> Brooke Dare, M.D. Juluis Mire MD ELECTRONICALLY SIGNED 09/06/2014 23:17

## 2014-09-08 NOTE — Plan of Care (Addendum)
Problem: Discharge Progression Outcomes Goal: Other Discharge Outcomes/Goals Plan of care progress to goal: Outcome: Progressing 1: likes to be called Jessic  2: lives at home with son  3: history of lung and breast cancer with right mastectomy  4: please update when h & P available   Pain: reports no pain  Hemodynamically stable : labs stable, afebrile, continues on iv antibiotics

## 2014-09-08 NOTE — H&P (Signed)
PATIENT NAME:  Betty Edwards, Betty Edwards MR#:  [id number] DATE OF BIRTH:  02/13/1946  DATE OF ADMISSION:  09/06/2014  REFERRING PHYSICIAN:  Elta Guadeloupe R. Jacqualine Code, MD   PRIMARY CARE PRACTITIONER:  Arlis Porta., MD  PRIMARY ONCOLOGIST:  Rhett Bannister [name], MD   CHIEF COMPLAINT:  Fever with chills of 103 degrees Fahrenheit.   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 The patient stated she has been feeling some generalized weakness which started yesterday and today she noticed fever with chills, temperature of 103 degrees Fahrenheit. Does have some chronic cough, but did not notice increased cough and chronic shortness of breath is stable from lung cancer. Otherwise, denies any chest pain. No nausea, vomiting, or diarrhea. No abdominal pain. Denies any dysuria, frequency, or urgency.  The patient also denies any recent URI symptoms. Of note, the patient gives a history of a tick bite about a week ago on the left side of the chest without any residual rash or inflammation. She does have a chest port on the right side of chest, but it is not painful. It is not inflamed or red. In the Emergency Room the patient was evaluated by the ED physician and was noted to have a temperature of 102.8 degrees Fahrenheit with a pulse rate of 126 per minute and workup revealed chronic anemia and a white blood cell count of 4.5, and the rest of the labs are unremarkable, and urinalysis unremarkable for any acute infection. She also underwent a chest x-ray which showed some nodules and masses from her lung cancer, but otherwise negative for any acute cardiopulmonary pathology. In view of her a  fever of 103 degrees Fahrenheit with tachycardia, she meets the criteria for sepsis, so blood cultures and urine cultures were sent and the patient was started on broad-spectrum antibiotics in view of immunocompromised status, and hospitalist service was consulted for further management. The patient does have a history of chronic pain syndrome. She is basically wheelchair bound because of her chronic arthritis and she takes chronic pain medications and currently her pain is under control.    PAST MEDICAL HISTORY:  1.  Lung cancer, undergoing chemotherapy under the care of oncologist; last chemotherapy last week.  2.  Hypertension.  3.  Coronary artery disease/MI, status post stent.  4.  Hypothyroidism.  5.  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  6.  Gastroesophageal reflux disease.  7.  Chronic pain syndrome, on chronic pain medications.   PAST SURGICAL HISTORY:  1.  Hysterectomy.  2.  Cholecystectomy.  3.  Right mastectomy for breast cancer in the remote past.  4.  Back surgery.  5.  C-section.   ALLERGIES:  IBUPROFEN MAKES HER STOMACH SICK, PRILOSEC MAKES HER JITTERY.   FAMILY HISTORY:  Significant for cancers and hypertension.   SOCIAL HISTORY:  She is single, lives with her son at home. She is basically wheelchair bound because of her arthritis with deformities. History of smoking 1/2 pack per day. Denies any alcohol or substance abuse.   REVIEW OF SYSTEMS:  CONSTITUTIONAL:  Positive for fever and chills of 103 degrees Fahrenheit. Positive for fatigue of 1-day duration.  EYES:  Negative for blurred vision or double vision. No pain, no redness, no discharge.  EARS, NOSE, AND THROAT:  Negative for tinnitus, ear pain, hearing loss, epistaxis, nasal discharge.  RESPIRATORY:  Positive for chronic cough, but no increased cough. Chronic mild shortness of breath present. No wheezing. No hemoptysis. No painful respiration.  CARDIOVASCULAR:  Negative  for chest pain, palpitations, dizziness, syncopal episodes, orthopnea, dyspnea on exertion, or pedal edema.  GASTROINTESTINAL:  Negative for nausea, vomiting, diarrhea, constipation, abdominal pain, hematemesis, melena, or rectal bleeding.  GENITOURINARY:  Negative for dysuria, frequency, urgency, or hematuria.  ENDOCRINE:  Negative for polyuria, nocturia, heat or cold intolerance.  HEMATOLOGIC AND LYMPHATIC:  Negative for anemia, easy bruising, bleeding, or swollen glands.  INTEGUMENTARY:  Negative for acne, skin rash, or lesions. She did have a history of a tick bite about a week ago on the left side of the chest with no residual rash or inflammation. She does have a port on the right side of the chest which is not tender and not inflamed.  MUSCULOSKELETAL:  Chronic pain syndrome because of chronic degenerative joint disease. The patient is on chronic pain medications, currently stable.  NEUROLOGICAL:  Negative for focal weakness or numbness. No history of CVA, TIA, or seizure disorder.  PSYCHIATRIC:  Negative for anxiety, insomnia, or depression.   PHYSICAL EXAMINATION:  VITAL SIGNS:  Temperature 102.8 degrees Fahrenheit, pulse rate 126 per minute, respirations 20 per minute, blood pressure 139/63, oxygen saturation is 94% on room air.  GENERAL:  Well developed, well nourished, alert, in no acute distress, comfortably resting in the bed.  HEAD:  Atraumatic, normocephalic.  EYES:  Pupils equal, react to light and accommodation. No conjunctival pallor. No icterus. Extraocular movements intact.  NOSE:  No drainage. No lesions.  EARS:  No drainage. No external lesions.  ORAL CAVITY:  No mucosal lesions. No exudates.  NECK:  Supple. No JVD. No thyromegaly. No carotid bruit. Range of motion of the neck is within normal limits.  RESPIRATORY:  Good respiratory effort. Not using accessory muscles of respiration. Bilateral vesicular breath sounds present. No rales or rhonchi.  CHEST:  Port-A-Cath on the  right side of the chest which is clean, not tender, no inflammation.  CARDIOVASCULAR:  S1, S2 regular. No murmurs, gallops, or clicks. Pulses equal at carotid, femoral, and pedal pulses. No peripheral edema.  GASTROINTESTINAL:  Abdomen is soft, nontender. No hepatosplenomegaly. No masses noted. No guarding. Bowel sounds present and equal in all 4 quadrants.  GENITOURINARY:  Deferred.  MUSCULOSKELETAL:  Gross deformity of the right knee secondary to degenerative joint disease present. Decreased range of motion of the right knee. Strength and tone equal bilaterally.  SKIN:  Inspection within normal limits. No obvious wounds.  LYMPHATIC:  No cervical lymphadenopathy.  VASCULAR:  Good dorsalis pedis and posterior tibial pulses.  NEUROLOGIC:  Alert, awake, and oriented x 3. Cranial nerves II to XII grossly intact. No sensory deficit. Motor strength 5/5 in both upper and lower extremities. DTRs 2+ bilateral, symmetrical.  PSYCHIATRIC:  Alert, awake, and oriented x 3. Judgment and insight are adequate. Memory and mood within normal limits.   ANCILLARY DATA:  LABORATORY DATA:  Serum glucose 95, BUN 12, creatinine 0.63, sodium 134, potassium 3.8, chloride 102, bicarbonate 27, total calcium 8.3. Lactic acid 1.1. Total protein 6.1, albumin 3.5, total bilirubin 0.6, alkaline phosphatase 87, AST 15, ALT 9. Troponin less than 0.03. WBC  4.5, hemoglobin 10.2, hematocrit 31.8, platelet count 145,000. Influenza A and B negative.  Urinalysis: Clear, leukocyte esterase negative, nitrite negative, WBC 0 to 5.   Chest x-ray:  Nodules/masses at the right lung base, otherwise negative for acute cardiopulmonary disease.   EKG:  Sinus tachycardia with ventricular rate of 123 beats per minute. No acute ST-T changes.   Of note, home medications are not evaluated at this time since medication reconciliation is pending at this time.   ASSESSMENT AND PLAN:  A 69 year old Caucasian female with a history of lung cancer  undergoing chemotherapy presents with complaints of fever with chills of 103 degrees Fahrenheit and generalized weakness of 1-day duration, chronic mild cough present; otherwise unremarkable, and found to be febrile with tachycardia meeting sepsis criteria. Source of sepsis  is not clear at this time.  1.  Sepsis with a fever of 103 degrees Fahrenheit with tachycardia. Source of sepsis not clear at this time. Rule out pneumonia, rule out bacteremia. The patient had a history of osteomyelitis with methicillin-resistant Staphylococcus aureus in the remote past; last episode more than a year ago. No signs of bone infection at this time. Plan:  Admit to medical floor. Blood cultures, sputum cultures, and intravenous antibiotics -- vancomycin and Zosyn. Tylenol as needed. Follow up cultures and laboratories.  2.  Lung cancer on chemotherapy, under the care of oncology; received chemotherapy last week. The patient is stable clinically. Continue care per oncology. Oncology consult placed.  3.  Hypertension, stable on home medications:  Continue same.  4.  Chronic pain syndrome, stable on chronic pain medications:  Continue same.  History of coronary artery disease/myocardial infarction, status post stents, stable. No acute problems:  Monitor and continue home medications.  5.  Tobacco usage, continuous:  Counseled to quit. The patient is not motivated.  6.  Hypothyroidism, stable on home medications:  Continue same.  7.  Deep vein thrombosis prophylaxis:  Subcutaneous Lovenox.  8.  Gastrointestinal prophylaxis:  Proton pump inhibitor.   CODE STATUS:  FULL CODE.   TIME SPENT:  50 minutes.    ____________________________ Juluis Mire, MD enr:TT D: 09/06/2014 21:29:06 ET T: 09/06/2014 22:04:03 ET JOB#: [id number]  cc: Juluis Mire, MD, <Dictator> Arlis Porta., MD Rhett Bannister. [name], MD Juluis Mire MD ELECTRONICALLY SIGNED 09/06/2014 23:21

## 2014-09-09 ENCOUNTER — Other Ambulatory Visit: Payer: Self-pay

## 2014-09-09 DIAGNOSIS — C50919 Malignant neoplasm of unspecified site of unspecified female breast: Secondary | ICD-10-CM

## 2014-09-10 ENCOUNTER — Inpatient Hospital Stay: Payer: Medicare Other

## 2014-09-10 ENCOUNTER — Inpatient Hospital Stay: Payer: Medicare Other | Attending: Internal Medicine

## 2014-09-10 DIAGNOSIS — Z79899 Other long term (current) drug therapy: Secondary | ICD-10-CM | POA: Diagnosis not present

## 2014-09-10 DIAGNOSIS — C50919 Malignant neoplasm of unspecified site of unspecified female breast: Secondary | ICD-10-CM

## 2014-09-10 DIAGNOSIS — C7801 Secondary malignant neoplasm of right lung: Secondary | ICD-10-CM | POA: Insufficient documentation

## 2014-09-10 DIAGNOSIS — G8929 Other chronic pain: Secondary | ICD-10-CM | POA: Diagnosis not present

## 2014-09-10 DIAGNOSIS — Z87891 Personal history of nicotine dependence: Secondary | ICD-10-CM | POA: Insufficient documentation

## 2014-09-10 DIAGNOSIS — C50911 Malignant neoplasm of unspecified site of right female breast: Secondary | ICD-10-CM

## 2014-09-10 DIAGNOSIS — R5383 Other fatigue: Secondary | ICD-10-CM | POA: Diagnosis not present

## 2014-09-10 DIAGNOSIS — F1120 Opioid dependence, uncomplicated: Secondary | ICD-10-CM | POA: Diagnosis not present

## 2014-09-10 DIAGNOSIS — Z8614 Personal history of Methicillin resistant Staphylococcus aureus infection: Secondary | ICD-10-CM | POA: Diagnosis not present

## 2014-09-10 DIAGNOSIS — D649 Anemia, unspecified: Secondary | ICD-10-CM | POA: Diagnosis not present

## 2014-09-10 DIAGNOSIS — C7800 Secondary malignant neoplasm of unspecified lung: Principal | ICD-10-CM

## 2014-09-10 DIAGNOSIS — Z5111 Encounter for antineoplastic chemotherapy: Secondary | ICD-10-CM | POA: Diagnosis present

## 2014-09-10 DIAGNOSIS — Z9013 Acquired absence of bilateral breasts and nipples: Secondary | ICD-10-CM | POA: Insufficient documentation

## 2014-09-10 DIAGNOSIS — I10 Essential (primary) hypertension: Secondary | ICD-10-CM | POA: Insufficient documentation

## 2014-09-10 LAB — CBC WITH DIFFERENTIAL/PLATELET
Basophils Absolute: 0 10*3/uL (ref 0–0.1)
Basophils Relative: 1 %
Eosinophils Absolute: 0.2 10*3/uL (ref 0–0.7)
Eosinophils Relative: 5 %
HCT: 28.6 % — ABNORMAL LOW (ref 35.0–47.0)
Hemoglobin: 9.2 g/dL — ABNORMAL LOW (ref 12.0–16.0)
LYMPHS ABS: 1.4 10*3/uL (ref 1.0–3.6)
Lymphocytes Relative: 46 %
MCH: 30.6 pg (ref 26.0–34.0)
MCHC: 32.1 g/dL (ref 32.0–36.0)
MCV: 95.2 fL (ref 80.0–100.0)
MONO ABS: 0.2 10*3/uL (ref 0.2–0.9)
Neutro Abs: 1.2 10*3/uL — ABNORMAL LOW (ref 1.4–6.5)
Platelets: 185 10*3/uL (ref 150–440)
RBC: 3.01 MIL/uL — ABNORMAL LOW (ref 3.80–5.20)
RDW: 18.2 % — AB (ref 11.5–14.5)
WBC: 3 10*3/uL — ABNORMAL LOW (ref 3.6–11.0)

## 2014-09-10 MED ORDER — HEPARIN SOD (PORK) LOCK FLUSH 100 UNIT/ML IV SOLN
500.0000 [IU] | Freq: Once | INTRAVENOUS | Status: AC | PRN
  Administered 2014-09-10: 500 [IU]
  Filled 2014-09-10 (×2): qty 5

## 2014-09-10 MED ORDER — SODIUM CHLORIDE 0.9 % IV SOLN
Freq: Once | INTRAVENOUS | Status: AC
  Administered 2014-09-10: 11:00:00 via INTRAVENOUS
  Filled 2014-09-10: qty 250

## 2014-09-10 MED ORDER — SODIUM CHLORIDE 0.9 % IV SOLN
Freq: Once | INTRAVENOUS | Status: AC
  Administered 2014-09-10: 12:00:00 via INTRAVENOUS
  Filled 2014-09-10: qty 4

## 2014-09-10 MED ORDER — VINORELBINE TARTRATE CHEMO INJECTION 50 MG/5ML
20.0000 mg/m2 | Freq: Once | INTRAVENOUS | Status: AC
  Administered 2014-09-10: 33 mg via INTRAVENOUS
  Filled 2014-09-10: qty 3.3

## 2014-09-11 NOTE — Discharge Summary (Signed)
[facility] [place] NAME: Betty Edwards    MR#:  [id number]  DATE OF BIRTH:  Jun 21, 1945  DATE OF ADMISSION:  09/06/2014 ADMITTING PHYSICIAN: Juluis Mire, MD  DATE OF DISCHARGE: 09/08/2014  5:24 PM  PRIMARY CARE PHYSICIAN: No primary care provider on file.    ADMISSION DIAGNOSIS:  Sepsis Lung Cancer Hypertension   DISCHARGE DIAGNOSIS:  Principal Problem:   SIRS (systemic inflammatory response syndrome) Active Problems:   Hypertension   Fever presenting with conditions classified elsewhere   Fever   SECONDARY DIAGNOSIS:   Past Medical History  Diagnosis Date  . 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 The patient stated she has been feeling some generalized weakness which started yesterday and today she noticed fever with chills, temperature of 103 degrees Fahrenheit. Does have some chronic cough, but did not notice increased cough and chronic shortness of breath is stable from lung cancer. Otherwise, denies any chest pain. No nausea, vomiting, or diarrhea. No abdominal pain. Denies any dysuria, frequency, or urgency. The patient also denies any recent URI symptoms. Of note, the patient gives a history of a tick bite about a week ago on the left side of the chest without any residual rash or inflammation. She does have a chest port on the right side of chest, but it is not painful. It is not inflamed or red. In the Emergency Room the patient was evaluated by the ED physician and was noted to have a temperature of 102.8 degrees Fahrenheit with a  pulse rate of 126 per minute and workup revealed chronic anemia and a white blood cell count of 4.5, and the rest of the labs are unremarkable, and urinalysis unremarkable for any acute infection. She also underwent a chest x-ray which showed some nodules and masses from her lung cancer, but otherwise negative for any acute cardiopulmonary pathology. In view of her a fever of 103 degrees Fahrenheit with tachycardia, she meets the criteria for sepsis, so blood cultures and urine cultures were sent and the patient was started on broad-spectrum antibiotics in view of immunocompromised status, and hospitalist service was consulted for further management. The patient does have a history of chronic pain syndrome. She is basically wheelchair bound because of her chronic arthritis and she takes chronic pain medications and currently her pain is under control.   * SIRS (systemic inflammatory response syndrome)  On IV abx- empirically- BC collected- have medport- no signs of infection around it.  Blood cx negative so far ( as checked in old SCM system- Not getting updated yet in EPIC( new) system)  UA, Xray chest- negative.  No abdominal or skin findings of infecion.  Had tick bite last week- no sking changes , or new joint pains.- [facility] fever IGM sent.  As no more fever- will d/c home today- with No antibiotics.  Have appointment with Cancer center this Wednesday.  Active Problems:  * Hypertension- atenolol- controlled.   * Lung cancer- she follows with Dr. [name]- last chemo- last week. Continue to follow. * Chronic pain syndrome,  stable on chronic pain medications: metahdone, Continue same.  *  History of coronary artery disease/myocardial infarction,  status post stents, stable. No acute problems: Monitor and continue home medications.  * Tobacco usage, continuous: Counseled to quit for 4 min. The patient is not motivated.  *  Hypothyroidism, stable on home medications: Continue same.   DISCHARGE CONDITIONS:   stable  CONSULTS OBTAINED:    none DRUG ALLERGIES:   Allergies  Allergen Reactions  . Ibuprofen Other (See Comments)    GI Distress  . Prilosec [Omeprazole] Other (See Comments)    Jerky Movements    DISCHARGE MEDICATIONS:   Discharge Medication List as of 09/08/2014  2:52 PM    CONTINUE these medications which have NOT CHANGED   Details  ALBUTEROL IN Inhale 2 puffs into the lungs 4 (four) times daily as needed., Until Discontinued, Historical Med    atenolol (TENORMIN) 25 MG tablet Take 12.5 mg by mouth daily., Until Discontinued, Historical Med    busPIRone (BUSPAR) 5 MG tablet Take 5 mg by mouth 3 (three) times daily., Until Discontinued, Historical Med    DULoxetine (CYMBALTA) 30 MG capsule Take 30 mg by mouth daily., Until Discontinued, Historical Med    exemestane (AROMASIN) 25 MG tablet Take 25 mg by mouth once., Historical Med    Fluticasone-Salmeterol (ADVAIR) 250-50 MCG/DOSE AEPB Inhale 1 puff into the lungs 2 (two) times daily as needed., Until Discontinued, Historical Med    gabapentin (NEURONTIN) 300 MG capsule Take 300 mg by mouth 3 (three) times daily., Until Discontinued, Historical Med    levothyroxine (SYNTHROID, LEVOTHROID) 50 MCG tablet Take 50 mcg by mouth daily before breakfast., Until Discontinued, Historical Med    pantoprazole (PROTONIX) 40 MG tablet Take 40 mg by mouth daily., Until Discontinued, Historical Med         DISCHARGE INSTRUCTIONS:   Follow with cancer center in 2 days.   If you experience worsening of your admission symptoms, develop shortness of breath, life threatening emergency, suicidal or homicidal thoughts you must seek medical attention immediately by calling 911 or calling your MD immediately  if symptoms less severe.  You Must read complete instructions/literature along with all the possible adverse reactions/side effects for all the  Medicines you take and that have been prescribed to you. Take any new Medicines after you have completely understood and accept all the possible adverse reactions/side effects.   Please note  You were cared for by a hospitalist during your hospital stay. If you have any questions about your discharge medications or the care you received while you were in the hospital after you are discharged, you can call the unit and asked to speak with the hospitalist on call if the hospitalist that took care of you is not available. Once you are discharged, your primary care physician will handle any further medical issues. Please note that NO REFILLS for any discharge medications will be authorized once you are discharged, as it is imperative that you return to your primary care physician (or establish a relationship with a primary care physician if you do not have one) for your aftercare needs so that they can reassess your need for medications and monitor your lab values.    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 The patient stated she has been feeling some generalized weakness which started yesterday and today she noticed fever with chills, temperature of 103 degrees Fahrenheit. Does have some chronic cough, but did not notice increased cough and chronic shortness of breath is stable from lung cancer. Otherwise, denies any chest pain. No nausea, vomiting, or diarrhea. No abdominal pain. Denies any dysuria, frequency, or urgency. The patient also denies any recent URI symptoms. Of note, the patient gives a history of a tick bite about a week ago on the  left side of the chest without any residual rash or inflammation. She does have a chest port on the right side of chest, but it is not painful. It is not inflamed or red. In the Emergency Room the patient was evaluated by the ED physician and was noted to have a temperature of 102.8 degrees Fahrenheit with a pulse rate of 126 per minute and workup revealed chronic anemia and a white blood cell count of 4.5, and the rest of the labs are unremarkable, and urinalysis unremarkable for any acute infection. She also underwent a chest x-ray which showed some nodules and masses from her lung cancer, but otherwise negative for any acute cardiopulmonary pathology. In view of her a fever of 103 degrees Fahrenheit with tachycardia, she meets the criteria for sepsis, so blood cultures and urine cultures were sent and the patient was started on broad-spectrum antibiotics in view of immunocompromised status, and hospitalist service was consulted for further management. The patient does have a history of chronic pain syndrome. She is basically wheelchair bound because of her chronic arthritis and she takes chronic pain medications and currently her pain is under control. VITAL SIGNS:  Blood pressure 111/62, pulse 87, temperature 98.6 F (37 C), temperature source Oral, resp. rate 19, height 5\' 4"  (1.626 m), weight 132 lb 12.8 oz (60.238 kg), SpO2 97 %.  I/O:  No intake or output data in the 24 hours ending 09/11/14 1032    DATA REVIEW:   CBC  Recent Labs Lab 09/10/14 0933  WBC 3.0*  HGB 9.2*  HCT 28.6*  PLT 185    Chemistries   Recent Labs Lab 09/06/14 1958 09/08/14 0509  NA 134* 138  K 3.8 3.3*  CL 102 107  CO2 27 26  GLUCOSE 95 98  BUN 12 13  CREATININE 0.63 0.56  CALCIUM 8.3* 8.2*  AST 15  --   ALT 9*  --   ALKPHOS 87  --     Cardiac Enzymes No results for input(s): TROPONINI in the last 168 hours.  Microbiology Results  Results for orders placed or performed during the hospital  encounter of 09/06/14  Culture, blood (single)     Status: None (Preliminary result)   Collection Time: 09/06/14  7:30 PM  Result Value Ref Range Status   Micro Text Report   Preliminary       COMMENT                   NO GROWTH IN 18-24 HOURS   ANTIBIOTIC                                                      Culture, blood (single)     Status: None (Preliminary result)   Collection Time: 09/06/14  7:47  PM  Result Value Ref Range Status   Micro Text Report   Preliminary       COMMENT                   NO GROWTH IN 39 HOURS   ANTIBIOTIC                                                      ED Influenza     Status: None   Collection Time: 09/06/14  7:49 PM  Result Value Ref Range Status   Influenza A By PCR NEGATIVE FOR INFLUENZA A (ANTIGEN ABSENT)  Final    Comment: A negative result does not exclude influenza. Correlation with clinical impression is required.    Influenza B By PCR NEGATIVE FOR INFLUENZA B (ANTIGEN ABSENT)  Final    RADIOLOGY:  No results found.  EKG:   Orders placed or performed in visit on 09/06/14  . EKG 12-Lead      Management plans discussed with the patient, family and they are in agreement.  CODE STATUS:   TOTAL TIME TAKING CARE OF THIS PATIENT: 40 minutes.    Vaughan Basta M.D on 09/11/2014 at 10:32 AM  Between 7am to 6pm - Pager - [phone]  After 6pm go to [email or web address] - password [email or web address]  [facility]  Office  [phone]  CC: Primary care physician; No primary care provider on file.

## 2014-09-12 ENCOUNTER — Telehealth: Payer: Self-pay | Admitting: *Deleted

## 2014-09-12 NOTE — Telephone Encounter (Signed)
Pt had asked if there was anything else she could do because she is so tired and stays in bed a lot since her last treatment and then told me she did not get her treatment on wed.  She said she had fever over weekend and went to ER and was admitted and given atb.  Checked with nurse and she did get treatment, the patient had just slept through it.  Encouraged to drink and eat good, keeping hydrated will help. Gave her cans of ensure.  I told her I would check with Dr. [name] and see if he has other ideas and he just told me to have her call if she has fever over 100.5 or after office hours go to ER.  Make sure next visit is to see him and if she is still feeling that bad that he may need to determine if she needs any more treatment at this time.  Gave all of this info to her son Legrand Como. He does know the appt.date and time of next visit.

## 2014-09-22 ENCOUNTER — Encounter: Payer: Self-pay | Admitting: *Deleted

## 2014-09-22 ENCOUNTER — Other Ambulatory Visit: Payer: Self-pay | Admitting: *Deleted

## 2014-09-22 DIAGNOSIS — C50911 Malignant neoplasm of unspecified site of right female breast: Secondary | ICD-10-CM

## 2014-09-22 DIAGNOSIS — C50919 Malignant neoplasm of unspecified site of unspecified female breast: Secondary | ICD-10-CM | POA: Insufficient documentation

## 2014-09-22 DIAGNOSIS — C7800 Secondary malignant neoplasm of unspecified lung: Principal | ICD-10-CM

## 2014-09-22 HISTORY — DX: Malignant neoplasm of unspecified site of unspecified female breast: C50.919

## 2014-09-22 LAB — CULTURE, BLOOD (SINGLE)

## 2014-09-24 ENCOUNTER — Inpatient Hospital Stay (HOSPITAL_BASED_OUTPATIENT_CLINIC_OR_DEPARTMENT_OTHER): Payer: Medicare Other | Admitting: Internal Medicine

## 2014-09-24 ENCOUNTER — Inpatient Hospital Stay: Payer: Medicare Other

## 2014-09-24 VITALS — BP 95/60 | HR 106 | Temp 98.0°F | Ht 64.0 in | Wt 115.7 lb

## 2014-09-24 DIAGNOSIS — C7800 Secondary malignant neoplasm of unspecified lung: Principal | ICD-10-CM

## 2014-09-24 DIAGNOSIS — F1120 Opioid dependence, uncomplicated: Secondary | ICD-10-CM

## 2014-09-24 DIAGNOSIS — C50911 Malignant neoplasm of unspecified site of right female breast: Secondary | ICD-10-CM

## 2014-09-24 DIAGNOSIS — Z87891 Personal history of nicotine dependence: Secondary | ICD-10-CM

## 2014-09-24 DIAGNOSIS — D649 Anemia, unspecified: Secondary | ICD-10-CM

## 2014-09-24 DIAGNOSIS — Z8614 Personal history of Methicillin resistant Staphylococcus aureus infection: Secondary | ICD-10-CM

## 2014-09-24 DIAGNOSIS — G8929 Other chronic pain: Secondary | ICD-10-CM

## 2014-09-24 DIAGNOSIS — Z9013 Acquired absence of bilateral breasts and nipples: Secondary | ICD-10-CM

## 2014-09-24 DIAGNOSIS — I10 Essential (primary) hypertension: Secondary | ICD-10-CM

## 2014-09-24 DIAGNOSIS — R5383 Other fatigue: Secondary | ICD-10-CM

## 2014-09-24 DIAGNOSIS — Z79899 Other long term (current) drug therapy: Secondary | ICD-10-CM

## 2014-09-24 DIAGNOSIS — C7801 Secondary malignant neoplasm of right lung: Secondary | ICD-10-CM | POA: Diagnosis not present

## 2014-09-24 DIAGNOSIS — Z5111 Encounter for antineoplastic chemotherapy: Secondary | ICD-10-CM | POA: Diagnosis not present

## 2014-09-24 LAB — CBC WITH DIFFERENTIAL/PLATELET
BASOS ABS: 0.1 10*3/uL (ref 0–0.1)
Basophils Relative: 2 %
EOS ABS: 0.1 10*3/uL (ref 0–0.7)
Eosinophils Relative: 2 %
HCT: 30.3 % — ABNORMAL LOW (ref 35.0–47.0)
Hemoglobin: 9.4 g/dL — ABNORMAL LOW (ref 12.0–16.0)
Lymphocytes Relative: 51 %
Lymphs Abs: 2.2 10*3/uL (ref 1.0–3.6)
MCH: 29.6 pg (ref 26.0–34.0)
MCHC: 31.1 g/dL — AB (ref 32.0–36.0)
MCV: 95.1 fL (ref 80.0–100.0)
Monocytes Absolute: 0.4 10*3/uL (ref 0.2–0.9)
Monocytes Relative: 10 %
NEUTROS ABS: 1.5 10*3/uL (ref 1.4–6.5)
Neutrophils Relative %: 35 %
PLATELETS: 304 10*3/uL (ref 150–440)
RBC: 3.19 MIL/uL — ABNORMAL LOW (ref 3.80–5.20)
RDW: 18.2 % — AB (ref 11.5–14.5)
WBC: 4.2 10*3/uL (ref 3.6–11.0)

## 2014-09-24 LAB — HEPATIC FUNCTION PANEL
ALBUMIN: 3.4 g/dL — AB (ref 3.5–5.0)
ALK PHOS: 95 U/L (ref 38–126)
ALT: 9 U/L — AB (ref 14–54)
AST: 15 U/L (ref 15–41)
Bilirubin, Direct: <0.1 mg/dL — ABNORMAL LOW (ref 0.1–0.5)
TOTAL PROTEIN: 6.2 g/dL — AB (ref 6.5–8.1)
Total Bilirubin: 0.3 mg/dL (ref 0.3–1.2)

## 2014-09-24 LAB — CREATININE, SERUM
CREATININE: 0.61 mg/dL (ref 0.44–1.00)
GFR calc non Af Amer: >60 mL/min (ref >60)

## 2014-09-24 MED ORDER — VINORELBINE TARTRATE CHEMO INJECTION 50 MG/5ML
20.0000 mg/m2 | Freq: Once | INTRAVENOUS | Status: AC
  Administered 2014-09-24: 33 mg via INTRAVENOUS
  Filled 2014-09-24: qty 3.3

## 2014-09-24 MED ORDER — HEPARIN SOD (PORK) LOCK FLUSH 100 UNIT/ML IV SOLN
500.0000 [IU] | Freq: Once | INTRAVENOUS | Status: AC | PRN
  Administered 2014-09-24: 500 [IU]
  Filled 2014-09-24: qty 5

## 2014-09-24 MED ORDER — SODIUM CHLORIDE 0.9 % IV SOLN
Freq: Once | INTRAVENOUS | Status: AC
  Administered 2014-09-24: 12:00:00 via INTRAVENOUS
  Filled 2014-09-24: qty 4

## 2014-09-24 MED ORDER — SODIUM CHLORIDE 0.9 % IJ SOLN
10.0000 mL | INTRAMUSCULAR | Status: DC | PRN
  Filled 2014-09-24: qty 10

## 2014-09-24 MED ORDER — SODIUM CHLORIDE 0.9 % IV SOLN
Freq: Once | INTRAVENOUS | Status: AC
  Administered 2014-09-24: 12:00:00 via INTRAVENOUS
  Filled 2014-09-24: qty 250

## 2014-09-24 NOTE — Progress Notes (Signed)
Patient states that she has been eating well. After weighing patient this morning, she states that she has not gained or lost any weight and it is exactly the same. She states that she does stay tired all the time.

## 2014-10-01 ENCOUNTER — Other Ambulatory Visit: Payer: Self-pay | Admitting: Family Medicine

## 2014-10-01 ENCOUNTER — Inpatient Hospital Stay: Payer: Medicare Other

## 2014-10-01 DIAGNOSIS — C50911 Malignant neoplasm of unspecified site of right female breast: Secondary | ICD-10-CM

## 2014-10-01 DIAGNOSIS — C7800 Secondary malignant neoplasm of unspecified lung: Principal | ICD-10-CM

## 2014-10-01 DIAGNOSIS — Z5111 Encounter for antineoplastic chemotherapy: Secondary | ICD-10-CM | POA: Diagnosis not present

## 2014-10-01 LAB — CBC WITH DIFFERENTIAL/PLATELET
Basophils Absolute: 0 10*3/uL (ref 0–0.1)
Basophils Relative: 1 %
EOS PCT: 4 %
Eosinophils Absolute: 0.2 10*3/uL (ref 0–0.7)
HCT: 30.8 % — ABNORMAL LOW (ref 35.0–47.0)
Hemoglobin: 9.7 g/dL — ABNORMAL LOW (ref 12.0–16.0)
LYMPHS ABS: 2.2 10*3/uL (ref 1.0–3.6)
LYMPHS PCT: 46 %
MCH: 29.9 pg (ref 26.0–34.0)
MCHC: 31.6 g/dL — AB (ref 32.0–36.0)
MCV: 94.5 fL (ref 80.0–100.0)
Monocytes Absolute: 0.2 10*3/uL (ref 0.2–0.9)
Monocytes Relative: 5 %
NEUTROS PCT: 44 %
Neutro Abs: 2.1 10*3/uL (ref 1.4–6.5)
PLATELETS: 237 10*3/uL (ref 150–440)
RBC: 3.26 MIL/uL — ABNORMAL LOW (ref 3.80–5.20)
RDW: 17.8 % — ABNORMAL HIGH (ref 11.5–14.5)
WBC: 4.7 10*3/uL (ref 3.6–11.0)

## 2014-10-01 MED ORDER — HEPARIN SOD (PORK) LOCK FLUSH 100 UNIT/ML IV SOLN: 500.0000 [IU] | Freq: Once | INTRAVENOUS | Status: DC

## 2014-10-01 MED ORDER — HEPARIN SOD (PORK) LOCK FLUSH 100 UNIT/ML IV SOLN
INTRAVENOUS | Status: AC
  Filled 2014-10-01: qty 5

## 2014-10-01 MED ORDER — AMOXICILLIN-POT CLAVULANATE 875-125 MG PO TABS: 1.0000 | ORAL_TABLET | Freq: Two times a day (BID) | ORAL | Status: DC

## 2014-10-01 MED ORDER — SODIUM CHLORIDE 0.9 % IJ SOLN
10.0000 mL | INTRAMUSCULAR | Status: DC | PRN
  Filled 2014-10-01: qty 10

## 2014-10-01 NOTE — Progress Notes (Signed)
Pt has spoke with check in staff that she was not feeling well. She thought she may have infection.  I told them to check her in and she can get her blood work done and then based on blood work and i will talk with her about sx. That I can then speak with md on call.  I spoke to pt and she thinks she has sinus infection. She ran fever yest 100.5. And the right nostril has green drainage.  I asked if she felt like it was in her chest and she states no.  She is tired and did not sleep well last night.  I spoke to [place] and she sent electronic rx for atb.  I checked with pt to see if she wants to proceed with treatment today or not and she does not want to.  I have made appt for labs, see md and treatment next week week.  I have asked infusion to take out needle from port and send her to appt desk for next appt.  Pt also wants ensure and I have went and got her some and gave it to her.

## 2014-10-03 NOTE — Progress Notes (Signed)
[place]  [phone] Fax:(336) [phone]     ID: Hilton Sinclair OB: 09-Aug-1945  MR#: [id number]  TDH#:[id number]  Patient Care Team: Arlis Porta., MD as PCP - General (Family Medicine)  CHIEF COMPLAINT/DIAGNOSIS:  Breast cancer likely stage IV disease given lung nodules with concurrent rising CA 27-29 levels March 2014. (initially presented with T2N0M0, Stage IIA right breast cancer status post total mastectomy and lymph node study at [facility] on 06/23/06. Tumor size was 3.4 cm. Grade III. Margins were negative. Five lymph nodes studies were negative. Oncotype DX score was 43, distance recurrence of 10 years of 29%. ER/PR positive. HER-2 negative.  Patient completed adjuvant chemotherapy with Adriamycin/Cytoxan x 4 cycles in August 2008. Started hormonal therapy with Femara in September 2008).  06/07/12 - had surgery for excision of non-healing wound in right mastectomy site, pathology negative for malignancy. Feb 2014 - PET/CT showed progressive lung nodules s/o metastatic disease. Mar 2014 - rising CA 27.29 upto 114.6. Then was on Faslodex injection 07/17/12 to Feb 2015.  CT C/A/P on 07/10/13 reported progression of the nodular pulmonary parenchymal metastasis in the right lower lobe and right middle lobe. Then started on Exemestane 25 mg PO daily + Afinitor 10 mg PO daily around end of March 2015. Unable to tolerate above treatment, started palliative chemotherapy with Halaven on 11/20/13. 06/11/14 - CT chest showed Progressive multifocal pulmonary metastases within the right lung and precarinal lymph node.   Started next-line chemo with Navelbine on 08/27/14.           HISTORY OF PRESENT ILLNESS:  Patient returns for continued oncology followup and to continue Navelbine chemotherapy. States that she is doing about the same, has chronic weakness and is chronically wheelchair bound due to multiple medical issues. She continues to have intermittent chronic pain  and hurts all over, today it is doing better. Denies any progressive cough, dyspnea at rest, chest pain, or hemoptysis. Appetite is fairly steady. No new headaches or seizures. Has chronic joint and bone pains. Denies any new bone pains, 0/10. Denies new mood disturbances.  REVIEW OF SYSTEMS:   ROS As in HPI above. In addition, no fever, chills. No new headaches or focal weakness.  No new mood disturbances. No  sore throat, cough, shortness of breath, sputum, hemoptysis or chest pain. No dizziness or palpitation. No abdominal pain, constipation, diarrhea, dysuria or hematuria. No new skin rash or bleeding symptoms. No new paresthesias in extremities. PS ECOG 3.  PAST MEDICAL HISTORY: Past Medical History  Diagnosis Date  . Breast cancer metastasized to lung 09/07/2014  . 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  Hypertension.  History of diastolic dysfunction.  Arthritis.   Asthma.  History of upper GI bleeding due to NSAID overuse.  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  Stage IIA right breast cancer in mid February 2008.  PAST SURGICAL HISTORY:  Stage IIA right breast cancer status post mastectomy and node study in mid February 2008.  Right femur lesion on bone scan, biopsied at [facility] in March 2008, negative for malignancy.  Cholecystectomy.  Appendectomy.  Total abdominal hysterectomy with ovaries spared.  FAMILY HISTORY: Mother had history of uterine cancer, daughter with teratoma. Otherwise denies any breast or ovarian cancers in the family.  ADVANCED DIRECTIVES:   SOCIAL HISTORY: History  Substance Use Topics  . Smoking status:  Not on file  . Smokeless tobacco: Not on file  . Alcohol Use: Not on file  States that she smoked for about 30 years but has now quit. Denies any regular alcohol intake. She has history of substance abuse in the past and is taking  Methadone at an outpatient clinic here.  Allergies  Allergen Reactions  . Ibuprofen Other (See Comments)    GI Distress  . Prilosec [Omeprazole] Other (See Comments)    Jerky Movements    Current Outpatient Prescriptions  Medication Sig Dispense Refill  . ALBUTEROL IN Inhale 2 puffs into the lungs 4 (four) times daily as needed.    Marland Kitchen atenolol (TENORMIN) 25 MG tablet Take 12.5 mg by mouth daily.    . busPIRone (BUSPAR) 5 MG tablet Take 5 mg by mouth 3 (three) times daily.    . DULoxetine (CYMBALTA) 30 MG capsule Take 30 mg by mouth daily.    Marland Kitchen exemestane (AROMASIN) 25 MG tablet Take 25 mg by mouth once.    . Fluticasone-Salmeterol (ADVAIR) 250-50 MCG/DOSE AEPB Inhale 1 puff into the lungs 2 (two) times daily as needed.    . gabapentin (NEURONTIN) 300 MG capsule Take 300 mg by mouth 3 (three) times daily.    Marland Kitchen levothyroxine (SYNTHROID, LEVOTHROID) 50 MCG tablet Take 50 mcg by mouth daily before breakfast.    . pantoprazole (PROTONIX) 40 MG tablet Take 40 mg by mouth daily.    Marland Kitchen amoxicillin-clavulanate (AUGMENTIN) 875-125 MG per tablet Take 1 tablet by mouth 2 (two) times daily. 20 tablet 0   No current facility-administered medications for this visit.    OBJECTIVE: Filed Vitals:   09/24/14 1031  BP: 95/60  Pulse: 106  Temp: 98 F (36.7 C)     Body mass index is 19.86 kg/(m^2).    ECOG FS:3 - Symptomatic, >50% confined to bed  GENERAL: Patient is sitting in wheelchair, chronically very weak, otherwise alert and oriented and in no acute distress. No icterus. HEENT: EOMs intact. Oral exam negative for thrush or lesions. No cervical lymphadenopathy. CVS: S1S2, regular LUNGS: Bilaterally decreased BS overall, no rhonchi. ABDOMEN: Soft, nontender. No hepatomegaly clinically.  EXTREMITIES: No pedal edema.  LAB RESULTS: WBC 4.2, Hb 9.4, plts 304, ANC 1.5.     Component Value Date/Time   NA 138 09/08/2014 0509   NA 134* 09/06/2014 1958   K 3.3* 09/08/2014 0509   K 3.8  09/06/2014 1958   CL 107 09/08/2014 0509   CL 102 09/06/2014 1958   CO2 26 09/08/2014 0509   CO2 27 09/06/2014 1958   GLUCOSE 98 09/08/2014 0509   GLUCOSE 95 09/06/2014 1958   BUN 13 09/08/2014 0509   BUN 12 09/06/2014 1958   CREATININE 0.61 09/24/2014 0948   CREATININE 0.63 09/06/2014 1958   CALCIUM 8.2* 09/08/2014 0509   CALCIUM 8.3* 09/06/2014 1958   PROT 6.2* 09/24/2014 0948   PROT 6.1* 09/06/2014 1958   ALBUMIN 3.4* 09/24/2014 0948   ALBUMIN 3.5 09/06/2014 1958   AST 15 09/24/2014 0948   AST 15 09/06/2014 1958   ALT 9* 09/24/2014 0948   ALT 9* 09/06/2014 1958   ALKPHOS 95 09/24/2014 0948   ALKPHOS 87 09/06/2014 1958   BILITOT 0.3 09/24/2014 0948   GFRNONAA >60 09/24/2014 0948   GFRNONAA >60 09/06/2014 1958   GFRAA >60 09/24/2014 0948   GFRAA >60 09/06/2014 1958             Lab Results  Component Value Date   LABCA2 123.6*  01/22/2014    STUDIES: Dg Chest Port 1 View  09/06/2014   CLINICAL DATA:  Shortness of breath  EXAM: PORTABLE CHEST - 1 VIEW  COMPARISON:  Chest radiograph dated 08/01/2014. CT chest dated 06/11/2014.  FINDINGS: Patient is rotated.  Nodules/masses at the lateral right lung base, better evaluated on prior CT. Lungs otherwise clear. No focal consolidation. No pleural effusion or pneumothorax.  Cardiomegaly.  Right chest power port terminates at the cavoatrial junction.  IMPRESSION: Nodules/masses at the lateral right lung base, better evaluated on prior CT.  Otherwise, no evidence of acute cardiopulmonary disease.   Electronically Signed   By: Julian Hy M.D.   On: 09/06/2014 19:49   02/12/14 - CT scan of the chest. IMPRESSION:  Several right lung metastases show slight decrease in size since previous study. Mild mediastinal lymphadenopathy in right paratracheal region, slightly increased compared to prior exam. New mild patchy airspace disease in posterior right upper lobe, suspicious for infectious or inflammatory etiology. Moderate hiatal  hernia again noted.  06/11/14 - CT chest. IMPRESSION:  1. Progressive multifocal pulmonary metastases within the right lung.  2. Progressive precarinal lymph node consistent with metastaticdisease.  3. Resolved right upper lobe airspace disease.  ASSESSMENT / PLAN:   1. Recurrent metastatic Breast cancer, stage IV disease given lung nodules with concurrent rising CA 27-29 levels. Had surgery on 06/07/12 for excision of non-healing wound in right mastectomy site, pathology negative for malignancy. Feb 2014 PET/CT showed progressive lung nodules s/o metastatic disease and in March 2014 she had rising CA 27.29 upto 114.6 and therapy was changed from Femara to Faslodex injection on 07/17/12. Jan 2015 again rising CA 27.29 level. CT C/A/P on 07/10/13 reported progression of right lung metastases, and was changed to Exemestane + Afinitor. Patient was unable to even tolerate lower dose Afinitor 5 mg. Then got Halaven chemotherapy. CT scan of the chest done on February 3 reported progression of right lung metastasis and precarinal lymph node. Have again explained that she has stage IV metastatic malignancy and it is incurable, and treatments offered are with palliative intent only, and given poor performance status she is at high risk for side effects. Have discussed options including supportive care/Hospice versus trying different cancer treatment. Patient states that she wants to continue on current Navelbine treatment. Will give Navelbine 20 mg/m2 IV 3 weeks on and 1 week off. Labs at 1 week, 2 weeks, 4 weeks and next 3 doses of chemo. Next MD followup at 5 weeks with CBC, met-B, LFT and make further treatment plan.    2. Hypomagnesemia -  Continue to monitor and supplement accordingly.  3. Anemia - likely secondary to chemotherapy effect and anemia of chronic disease. Patient is nonambulatory, denies progressive anemia symptoms at rest. Continue to monitor.  4. Pain - She is on chronic opioid medication and goes  to methadone clinic for her prescription. In between visits, patient advised to call or come to ER in case of any new symptoms or acute sickness. She is agreeable to this plan.       Leia Alf, MD   10/03/2014 12:48 AM

## 2014-10-08 ENCOUNTER — Inpatient Hospital Stay: Payer: Medicare Other | Attending: Internal Medicine

## 2014-10-08 ENCOUNTER — Ambulatory Visit: Payer: Medicare Other

## 2014-10-08 ENCOUNTER — Encounter (INDEPENDENT_AMBULATORY_CARE_PROVIDER_SITE_OTHER): Payer: Self-pay

## 2014-10-08 ENCOUNTER — Other Ambulatory Visit: Payer: Medicare Other

## 2014-10-08 ENCOUNTER — Inpatient Hospital Stay (HOSPITAL_BASED_OUTPATIENT_CLINIC_OR_DEPARTMENT_OTHER): Payer: Medicare Other | Admitting: Internal Medicine

## 2014-10-08 ENCOUNTER — Inpatient Hospital Stay: Payer: Medicare Other

## 2014-10-08 VITALS — BP 119/74 | HR 108 | Temp 98.6°F

## 2014-10-08 DIAGNOSIS — M898X9 Other specified disorders of bone, unspecified site: Secondary | ICD-10-CM | POA: Insufficient documentation

## 2014-10-08 DIAGNOSIS — Z8614 Personal history of Methicillin resistant Staphylococcus aureus infection: Secondary | ICD-10-CM

## 2014-10-08 DIAGNOSIS — F1120 Opioid dependence, uncomplicated: Secondary | ICD-10-CM | POA: Insufficient documentation

## 2014-10-08 DIAGNOSIS — Z170 Estrogen receptor positive status [ER+]: Secondary | ICD-10-CM | POA: Diagnosis not present

## 2014-10-08 DIAGNOSIS — C7800 Secondary malignant neoplasm of unspecified lung: Secondary | ICD-10-CM | POA: Insufficient documentation

## 2014-10-08 DIAGNOSIS — C50919 Malignant neoplasm of unspecified site of unspecified female breast: Secondary | ICD-10-CM | POA: Insufficient documentation

## 2014-10-08 DIAGNOSIS — Z9010 Acquired absence of unspecified breast and nipple: Secondary | ICD-10-CM | POA: Diagnosis not present

## 2014-10-08 DIAGNOSIS — Z8049 Family history of malignant neoplasm of other genital organs: Secondary | ICD-10-CM

## 2014-10-08 DIAGNOSIS — I10 Essential (primary) hypertension: Secondary | ICD-10-CM | POA: Diagnosis not present

## 2014-10-08 DIAGNOSIS — J45909 Unspecified asthma, uncomplicated: Secondary | ICD-10-CM | POA: Diagnosis not present

## 2014-10-08 DIAGNOSIS — M899 Disorder of bone, unspecified: Secondary | ICD-10-CM | POA: Insufficient documentation

## 2014-10-08 DIAGNOSIS — M2550 Pain in unspecified joint: Secondary | ICD-10-CM | POA: Insufficient documentation

## 2014-10-08 DIAGNOSIS — D649 Anemia, unspecified: Secondary | ICD-10-CM

## 2014-10-08 DIAGNOSIS — R5383 Other fatigue: Secondary | ICD-10-CM

## 2014-10-08 DIAGNOSIS — M129 Arthropathy, unspecified: Secondary | ICD-10-CM | POA: Insufficient documentation

## 2014-10-08 DIAGNOSIS — Z87891 Personal history of nicotine dependence: Secondary | ICD-10-CM | POA: Diagnosis not present

## 2014-10-08 DIAGNOSIS — C50911 Malignant neoplasm of unspecified site of right female breast: Secondary | ICD-10-CM

## 2014-10-08 DIAGNOSIS — Z5111 Encounter for antineoplastic chemotherapy: Secondary | ICD-10-CM | POA: Diagnosis present

## 2014-10-08 LAB — CBC WITH DIFFERENTIAL/PLATELET
Basophils Absolute: 0 10*3/uL (ref 0–0.1)
Basophils Relative: 1 %
EOS ABS: 0.1 10*3/uL (ref 0–0.7)
Eosinophils Relative: 4 %
HCT: 30.3 % — ABNORMAL LOW (ref 35.0–47.0)
HEMOGLOBIN: 9.5 g/dL — AB (ref 12.0–16.0)
LYMPHS ABS: 2 10*3/uL (ref 1.0–3.6)
LYMPHS PCT: 51 %
MCH: 29.3 pg (ref 26.0–34.0)
MCHC: 31.3 g/dL — ABNORMAL LOW (ref 32.0–36.0)
MCV: 93.7 fL (ref 80.0–100.0)
MONOS PCT: 9 %
Monocytes Absolute: 0.4 10*3/uL (ref 0.2–0.9)
NEUTROS ABS: 1.3 10*3/uL — AB (ref 1.4–6.5)
NEUTROS PCT: 35 %
Platelets: 214 10*3/uL (ref 150–440)
RBC: 3.24 MIL/uL — ABNORMAL LOW (ref 3.80–5.20)
RDW: 18.3 % — AB (ref 11.5–14.5)
WBC: 3.8 10*3/uL (ref 3.6–11.0)

## 2014-10-08 LAB — HEPATIC FUNCTION PANEL
ALK PHOS: 103 U/L (ref 38–126)
ALT: 9 U/L — ABNORMAL LOW (ref 14–54)
AST: 16 U/L (ref 15–41)
Albumin: 3.4 g/dL — ABNORMAL LOW (ref 3.5–5.0)
BILIRUBIN TOTAL: 0.5 mg/dL (ref 0.3–1.2)
Bilirubin, Direct: <0.1 mg/dL — ABNORMAL LOW (ref 0.1–0.5)
Total Protein: 6.1 g/dL — ABNORMAL LOW (ref 6.5–8.1)

## 2014-10-08 LAB — CREATININE, SERUM
Creatinine, Ser: 0.65 mg/dL (ref 0.44–1.00)
GFR calc Af Amer: >60 mL/min (ref >60)
GFR calc non Af Amer: >60 mL/min (ref >60)

## 2014-10-08 MED ORDER — VINORELBINE TARTRATE CHEMO INJECTION 50 MG/5ML
20.0000 mg/m2 | Freq: Once | INTRAVENOUS | Status: AC
  Administered 2014-10-08: 33 mg via INTRAVENOUS
  Filled 2014-10-08: qty 3.3

## 2014-10-08 MED ORDER — SODIUM CHLORIDE 0.9 % IJ SOLN
10.0000 mL | INTRAMUSCULAR | Status: DC | PRN
  Administered 2014-10-08: 10 mL
  Filled 2014-10-08: qty 10

## 2014-10-08 MED ORDER — SODIUM CHLORIDE 0.9 % IV SOLN
Freq: Once | INTRAVENOUS | Status: AC
  Administered 2014-10-08: 15:00:00 via INTRAVENOUS
  Filled 2014-10-08: qty 1000

## 2014-10-08 MED ORDER — SODIUM CHLORIDE 0.9 % IV SOLN
Freq: Once | INTRAVENOUS | Status: AC
  Administered 2014-10-08: 16:00:00 via INTRAVENOUS
  Filled 2014-10-08: qty 4

## 2014-10-08 MED ORDER — HEPARIN SOD (PORK) LOCK FLUSH 100 UNIT/ML IV SOLN
500.0000 [IU] | Freq: Once | INTRAVENOUS | Status: AC | PRN
  Administered 2014-10-08: 500 [IU]
  Filled 2014-10-08: qty 5

## 2014-10-08 NOTE — Progress Notes (Signed)
Patient was started on amoxicillin last week for her sinuses. She states that she feels much better since taking it.

## 2014-10-15 ENCOUNTER — Telehealth: Payer: Self-pay | Admitting: *Deleted

## 2014-10-15 MED ORDER — ONDANSETRON HCL 4 MG PO TABS: 4.0000 mg | ORAL_TABLET | Freq: Three times a day (TID) | ORAL | Status: DC | PRN

## 2014-10-15 NOTE — Telephone Encounter (Signed)
Escribed

## 2014-10-15 NOTE — Telephone Encounter (Signed)
Please call it in. Thanks. 

## 2014-10-16 NOTE — Progress Notes (Signed)
[place]  Telephone:(336) [phone] Fax:(336) [phone]     ID: Betty Edwards OB: [birth year]  MR#: [id number]  IRC#:[id number]  Patient Care Team: Arlis Porta., MD as PCP - General (Family Medicine)  CHIEF COMPLAINT/DIAGNOSIS:  Breast cancer likely stage IV disease given lung nodules with concurrent rising CA 27-29 levels March 2014. (initially presented with T2N0M0, Stage IIA right breast cancer status post total mastectomy and lymph node study at [facility] on 06/23/06. Tumor size was 3.4 cm. Grade III. Margins were negative. Five lymph nodes studies were negative. Oncotype DX score was 43, distance recurrence of 10 years of 29%. ER/PR positive. HER-2 negative.  Patient completed adjuvant chemotherapy with Adriamycin/Cytoxan x 4 cycles in August 2008. Started hormonal therapy with Femara in September 2008).  06/07/12 - had surgery for excision of non-healing wound in right mastectomy site, pathology negative for malignancy. Feb 2014 - PET/CT showed progressive lung nodules s/o metastatic disease. Mar 2014 - rising CA 27.29 upto 114.6. Then was on Faslodex injection 07/17/12 to Feb 2015.  CT C/A/P on 07/10/13 reported progression of the nodular pulmonary parenchymal metastasis in the right lower lobe and right middle lobe. Then started on Exemestane 25 mg PO daily + Afinitor 10 mg PO daily around end of March 2015. Unable to tolerate above treatment, started palliative chemotherapy with Halaven on 11/20/13. 06/11/14 - CT chest showed Progressive multifocal pulmonary metastases within the right lung and precarinal lymph node.   Started next-line chemo with Navelbine on 08/27/14.    HISTORY OF PRESENT ILLNESS:  Patient returns for continued oncology followup and to continue Navelbine chemotherapy. She had bronchitis symptoms last week and was given oral antibiotic. States that her symptoms are improving and wants to continue on with chemotherapy. No fever or chills. She has chronic  weakness and is chronically wheelchair bound due to multiple medical issues. Has chronic joint and bone pains.   REVIEW OF SYSTEMS:   ROS  As in HPI above. In addition, no new headaches or focal weakness. No sore throat or dysphagia. No dizziness or palpitation. No abdominal pain, constipation, diarrhea, dysuria or hematuria. PS ECOG 3.  PAST MEDICAL HISTORY: Past Medical History  Diagnosis Date  . Breast cancer metastasized to lung 09/07/2014  . 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  Hypertension.  History of diastolic dysfunction.  Arthritis.   Asthma.  History of upper GI bleeding due to NSAID overuse.  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  Stage IIA right breast cancer in mid February 2008.  PAST SURGICAL HISTORY:  Stage IIA right breast cancer status post mastectomy and node study in mid February 2008.  Right femur lesion on bone scan, biopsied at [facility] in March 2008, negative for malignancy.  Cholecystectomy.  Appendectomy.  Total abdominal hysterectomy with ovaries spared.  FAMILY HISTORY: Mother had history of uterine cancer, daughter with teratoma. Otherwise denies any breast or ovarian cancers in the family.  ADVANCED DIRECTIVES:   SOCIAL HISTORY: History  Substance Use Topics  . Smoking status: Not on file  . Smokeless tobacco: Not on file  . Alcohol Use: Not on file  States that she smoked for about 30 years but has now quit. Denies any regular alcohol intake. She has history of substance abuse in the past and is taking Methadone at an outpatient clinic here.  Allergies  Allergen Reactions  .  Ibuprofen Other (See Comments)    GI Distress  . Prilosec [Omeprazole] Other (See Comments)    Jerky Movements    Current Outpatient Prescriptions  Medication Sig Dispense Refill  . ALBUTEROL IN Inhale 2 puffs into the lungs 4 (four) times daily as  needed.    Marland Kitchen amoxicillin-clavulanate (AUGMENTIN) 875-125 MG per tablet Take 1 tablet by mouth 2 (two) times daily. 20 tablet 0  . atenolol (TENORMIN) 25 MG tablet Take 12.5 mg by mouth daily.    . busPIRone (BUSPAR) 5 MG tablet Take 5 mg by mouth 3 (three) times daily.    . DULoxetine (CYMBALTA) 30 MG capsule Take 30 mg by mouth daily.    Marland Kitchen exemestane (AROMASIN) 25 MG tablet Take 25 mg by mouth once.    . Fluticasone-Salmeterol (ADVAIR) 250-50 MCG/DOSE AEPB Inhale 1 puff into the lungs 2 (two) times daily as needed.    . gabapentin (NEURONTIN) 300 MG capsule Take 300 mg by mouth 3 (three) times daily.    Marland Kitchen levothyroxine (SYNTHROID, LEVOTHROID) 50 MCG tablet Take 50 mcg by mouth daily before breakfast.    . pantoprazole (PROTONIX) 40 MG tablet Take 40 mg by mouth daily.    . ondansetron (ZOFRAN) 4 MG tablet Take 1 tablet (4 mg total) by mouth every 8 (eight) hours as needed for nausea or vomiting. 60 tablet 2   No current facility-administered medications for this visit.    OBJECTIVE: Filed Vitals:   10/08/14 1435  BP: 119/74  Pulse: 108  Temp: 98.6 F (37 C)     There is no weight on file to calculate BMI.    ECOG FS:3 - Symptomatic, >50% confined to bed  GENERAL: Patient is sitting in wheelchair, chronically very weak, otherwise alert and oriented and in no acute distress. No icterus. HEENT: EOMs intact. Oral exam negative for thrush or lesions.   CVS: S1S2, regular LUNGS: Bilaterally decreased BS overall, no rhonchi. ABDOMEN: Soft, nontender.    LAB RESULTS: WBC 3.8, Hb 9.5, plts 214, ANC 1.3.             Lab Results  Component Value Date   LABCA2 123.6* 01/22/2014    STUDIES: No results found. 02/12/14 - CT scan of the chest. IMPRESSION:  Several right lung metastases show slight decrease in size since previous study. Mild mediastinal lymphadenopathy in right paratracheal region, slightly increased compared to prior exam. New mild patchy airspace disease in posterior  right upper lobe, suspicious for infectious or inflammatory etiology. Moderate hiatal hernia again noted.  06/11/14 - CT chest. IMPRESSION:  1. Progressive multifocal pulmonary metastases within the right lung.  2. Progressive precarinal lymph node consistent with metastaticdisease.  3. Resolved right upper lobe airspace disease.  ASSESSMENT / PLAN:   1. Recurrent metastatic Breast cancer, stage IV disease given lung nodules with concurrent rising CA 27-29 levels. Had surgery on 06/07/12 for excision of non-healing wound in right mastectomy site, pathology negative for malignancy. Feb 2014 PET/CT showed progressive lung nodules s/o metastatic disease and in March 2014 she had rising CA 27.29 upto 114.6 and therapy was changed from Femara to Faslodex injection on 07/17/12. Jan 2015 again rising CA 27.29 level. CT C/A/P on 07/10/13 reported progression of right lung metastases, and was changed to Exemestane + Afinitor. Patient was unable to even tolerate lower dose Afinitor 5 mg. Then got Halaven chemotherapy. CT scan of the chest done on February 3 reported progression of right lung metastasis and precarinal lymph node. Have again explained  that she has stage IV metastatic malignancy and it is incurable, and treatments offered are with palliative intent only, and given poor performance status she is at high risk for side effects. Patient recovering from bronchitis. Have discussed options including supportive care/Hospice versus trying different cancer treatment. Patient states that she wants to continue on current Navelbine treatment. Will proceed with Navelbine at current dosae. She will keep previously scheduled appts the same.   2. Hypomagnesemia -  Continue to monitor and supplement accordingly. 3. Anemia - likely secondary to chemotherapy effect and anemia of chronic disease. Patient is nonambulatory, denies progressive anemia symptoms at rest. Continue to monitor. 4. Pain - She is on chronic opioid  medication and goes to methadone clinic for her prescription. In between visits, patient advised to call or come to ER in case of any new symptoms or acute sickness. She is agreeable to this plan.     Leia Alf, MD   10/16/2014 11:26 AM

## 2014-10-22 ENCOUNTER — Ambulatory Visit: Payer: Medicare Other

## 2014-10-22 ENCOUNTER — Other Ambulatory Visit: Payer: Medicare Other

## 2014-10-22 ENCOUNTER — Inpatient Hospital Stay: Payer: Medicare Other

## 2014-10-22 VITALS — BP 98/60 | HR 97 | Temp 97.3°F

## 2014-10-22 DIAGNOSIS — C50919 Malignant neoplasm of unspecified site of unspecified female breast: Secondary | ICD-10-CM | POA: Diagnosis not present

## 2014-10-22 DIAGNOSIS — C7800 Secondary malignant neoplasm of unspecified lung: Principal | ICD-10-CM

## 2014-10-22 DIAGNOSIS — C50911 Malignant neoplasm of unspecified site of right female breast: Secondary | ICD-10-CM

## 2014-10-22 LAB — CBC WITH DIFFERENTIAL/PLATELET
Basophils Absolute: 0.1 10*3/uL (ref 0–0.1)
Basophils Relative: 2 %
EOS ABS: 0.2 10*3/uL (ref 0–0.7)
EOS PCT: 5 %
HCT: 28.7 % — ABNORMAL LOW (ref 35.0–47.0)
Hemoglobin: 8.9 g/dL — ABNORMAL LOW (ref 12.0–16.0)
LYMPHS ABS: 1.6 10*3/uL (ref 1.0–3.6)
Lymphocytes Relative: 37 %
MCH: 29.4 pg (ref 26.0–34.0)
MCHC: 30.9 g/dL — ABNORMAL LOW (ref 32.0–36.0)
MCV: 95.1 fL (ref 80.0–100.0)
Monocytes Absolute: 0.4 10*3/uL (ref 0.2–0.9)
Monocytes Relative: 10 %
Neutro Abs: 2 10*3/uL (ref 1.4–6.5)
Neutrophils Relative %: 46 %
PLATELETS: 247 10*3/uL (ref 150–440)
RBC: 3.02 MIL/uL — ABNORMAL LOW (ref 3.80–5.20)
RDW: 17.9 % — AB (ref 11.5–14.5)
WBC: 4.3 10*3/uL (ref 3.6–11.0)

## 2014-10-22 MED ORDER — SODIUM CHLORIDE 0.9 % IV SOLN
Freq: Once | INTRAVENOUS | Status: AC
  Administered 2014-10-22: 15:00:00 via INTRAVENOUS
  Filled 2014-10-22: qty 1000

## 2014-10-22 MED ORDER — SODIUM CHLORIDE 0.9 % IV SOLN
20.0000 mg/m2 | Freq: Once | INTRAVENOUS | Status: AC
  Administered 2014-10-22: 33 mg via INTRAVENOUS
  Filled 2014-10-22: qty 3.3

## 2014-10-22 MED ORDER — SODIUM CHLORIDE 0.9 % IV SOLN
Freq: Once | INTRAVENOUS | Status: AC
  Administered 2014-10-22: 15:00:00 via INTRAVENOUS
  Filled 2014-10-22: qty 4

## 2014-10-22 MED ORDER — HEPARIN SOD (PORK) LOCK FLUSH 100 UNIT/ML IV SOLN
500.0000 [IU] | Freq: Once | INTRAVENOUS | Status: DC | PRN
  Filled 2014-10-22 (×2): qty 5

## 2014-10-28 ENCOUNTER — Other Ambulatory Visit: Payer: Self-pay

## 2014-10-29 ENCOUNTER — Inpatient Hospital Stay: Payer: Medicare Other

## 2014-10-29 ENCOUNTER — Inpatient Hospital Stay: Payer: Medicare Other | Admitting: Internal Medicine

## 2014-11-12 ENCOUNTER — Inpatient Hospital Stay: Payer: Medicare Other

## 2014-11-12 ENCOUNTER — Inpatient Hospital Stay: Payer: Medicare Other | Attending: Internal Medicine

## 2014-11-12 ENCOUNTER — Inpatient Hospital Stay (HOSPITAL_BASED_OUTPATIENT_CLINIC_OR_DEPARTMENT_OTHER): Payer: Medicare Other | Admitting: Internal Medicine

## 2014-11-12 VITALS — BP 109/71 | HR 93 | Temp 98.7°F

## 2014-11-12 DIAGNOSIS — D649 Anemia, unspecified: Secondary | ICD-10-CM

## 2014-11-12 DIAGNOSIS — C50911 Malignant neoplasm of unspecified site of right female breast: Secondary | ICD-10-CM

## 2014-11-12 DIAGNOSIS — Z8049 Family history of malignant neoplasm of other genital organs: Secondary | ICD-10-CM | POA: Insufficient documentation

## 2014-11-12 DIAGNOSIS — Z79899 Other long term (current) drug therapy: Secondary | ICD-10-CM | POA: Insufficient documentation

## 2014-11-12 DIAGNOSIS — R918 Other nonspecific abnormal finding of lung field: Secondary | ICD-10-CM | POA: Insufficient documentation

## 2014-11-12 DIAGNOSIS — Z9049 Acquired absence of other specified parts of digestive tract: Secondary | ICD-10-CM | POA: Insufficient documentation

## 2014-11-12 DIAGNOSIS — R531 Weakness: Secondary | ICD-10-CM | POA: Diagnosis not present

## 2014-11-12 DIAGNOSIS — Z170 Estrogen receptor positive status [ER+]: Secondary | ICD-10-CM | POA: Diagnosis not present

## 2014-11-12 DIAGNOSIS — M2550 Pain in unspecified joint: Secondary | ICD-10-CM | POA: Insufficient documentation

## 2014-11-12 DIAGNOSIS — Z7982 Long term (current) use of aspirin: Secondary | ICD-10-CM

## 2014-11-12 DIAGNOSIS — M899 Disorder of bone, unspecified: Secondary | ICD-10-CM

## 2014-11-12 DIAGNOSIS — I10 Essential (primary) hypertension: Secondary | ICD-10-CM

## 2014-11-12 DIAGNOSIS — Z90710 Acquired absence of both cervix and uterus: Secondary | ICD-10-CM

## 2014-11-12 DIAGNOSIS — Z5111 Encounter for antineoplastic chemotherapy: Secondary | ICD-10-CM | POA: Diagnosis present

## 2014-11-12 DIAGNOSIS — J45909 Unspecified asthma, uncomplicated: Secondary | ICD-10-CM

## 2014-11-12 DIAGNOSIS — Z79811 Long term (current) use of aromatase inhibitors: Secondary | ICD-10-CM

## 2014-11-12 DIAGNOSIS — C7801 Secondary malignant neoplasm of right lung: Secondary | ICD-10-CM | POA: Insufficient documentation

## 2014-11-12 DIAGNOSIS — C7800 Secondary malignant neoplasm of unspecified lung: Principal | ICD-10-CM

## 2014-11-12 DIAGNOSIS — Z87891 Personal history of nicotine dependence: Secondary | ICD-10-CM

## 2014-11-12 DIAGNOSIS — F1120 Opioid dependence, uncomplicated: Secondary | ICD-10-CM | POA: Diagnosis not present

## 2014-11-12 LAB — CBC WITH DIFFERENTIAL/PLATELET
BASOS PCT: 1 %
Basophils Absolute: 0.1 10*3/uL (ref 0–0.1)
Eosinophils Absolute: 0.2 10*3/uL (ref 0–0.7)
Eosinophils Relative: 3 %
HCT: 32.5 % — ABNORMAL LOW (ref 35.0–47.0)
Hemoglobin: 9.9 g/dL — ABNORMAL LOW (ref 12.0–16.0)
LYMPHS ABS: 2.2 10*3/uL (ref 1.0–3.6)
Lymphocytes Relative: 44 %
MCH: 28.7 pg (ref 26.0–34.0)
MCHC: 30.6 g/dL — ABNORMAL LOW (ref 32.0–36.0)
MCV: 93.7 fL (ref 80.0–100.0)
Monocytes Absolute: 0.4 10*3/uL (ref 0.2–0.9)
Monocytes Relative: 8 %
NEUTROS ABS: 2.2 10*3/uL (ref 1.4–6.5)
Neutrophils Relative %: 44 %
PLATELETS: 290 10*3/uL (ref 150–440)
RBC: 3.47 MIL/uL — AB (ref 3.80–5.20)
RDW: 17.2 % — ABNORMAL HIGH (ref 11.5–14.5)
WBC: 5 10*3/uL (ref 3.6–11.0)

## 2014-11-12 MED ORDER — HEPARIN SOD (PORK) LOCK FLUSH 100 UNIT/ML IV SOLN
500.0000 [IU] | Freq: Once | INTRAVENOUS | Status: AC
  Administered 2014-11-12: 500 [IU] via INTRAVENOUS

## 2014-11-12 MED ORDER — SODIUM CHLORIDE 0.9 % IJ SOLN
10.0000 mL | INTRAMUSCULAR | Status: DC | PRN
  Administered 2014-11-12: 10 mL via INTRAVENOUS
  Filled 2014-11-12: qty 10

## 2014-11-12 MED ORDER — VINORELBINE TARTRATE CHEMO INJECTION 50 MG/5ML
20.0000 mg/m2 | Freq: Once | INTRAVENOUS | Status: AC
  Administered 2014-11-12: 33 mg via INTRAVENOUS
  Filled 2014-11-12: qty 3.3

## 2014-11-12 MED ORDER — SODIUM CHLORIDE 0.9 % IV SOLN
Freq: Once | INTRAVENOUS | Status: AC
  Administered 2014-11-12: 12:00:00 via INTRAVENOUS
  Filled 2014-11-12: qty 1000

## 2014-11-12 MED ORDER — SODIUM CHLORIDE 0.9 % IV SOLN
Freq: Once | INTRAVENOUS | Status: AC
  Administered 2014-11-12: 12:00:00 via INTRAVENOUS
  Filled 2014-11-12: qty 4

## 2014-11-13 ENCOUNTER — Telehealth: Payer: Self-pay | Admitting: *Deleted

## 2014-11-14 MED ORDER — GABAPENTIN 300 MG PO CAPS: ORAL_CAPSULE | ORAL | Status: DC

## 2014-11-14 NOTE — Telephone Encounter (Signed)
Advised of Dr Candelaria Stagers plan and she was thankful and in agreement with it. Repeated neurontin dose back to me

## 2014-11-14 NOTE — Telephone Encounter (Signed)
1. If she is taking Neurontin 300 mg PO TID, increase the bedtime dose to 600 mg since neuropathy is more bothersome at night.  2. She will be completing cycle 3 Navelbine on 7/13, have therefore requested repeat CA 27.29 level to be drawn on 7/27. Next CT scan will be after cycle 4 is done (unless tumor marker from 7/27 is rising).  Thanks.

## 2014-11-18 ENCOUNTER — Other Ambulatory Visit: Payer: Self-pay | Admitting: Family Medicine

## 2014-11-18 DIAGNOSIS — C50911 Malignant neoplasm of unspecified site of right female breast: Secondary | ICD-10-CM

## 2014-11-18 DIAGNOSIS — C7800 Secondary malignant neoplasm of unspecified lung: Principal | ICD-10-CM

## 2014-11-18 MED ORDER — DULOXETINE HCL 30 MG PO CPEP: ORAL_CAPSULE | ORAL | Status: DC

## 2014-11-19 ENCOUNTER — Inpatient Hospital Stay: Payer: Medicare Other

## 2014-11-19 DIAGNOSIS — C50911 Malignant neoplasm of unspecified site of right female breast: Secondary | ICD-10-CM

## 2014-11-19 DIAGNOSIS — Z5111 Encounter for antineoplastic chemotherapy: Secondary | ICD-10-CM | POA: Diagnosis not present

## 2014-11-19 DIAGNOSIS — C7800 Secondary malignant neoplasm of unspecified lung: Principal | ICD-10-CM

## 2014-11-19 LAB — CBC WITH DIFFERENTIAL/PLATELET
BASOS PCT: 1 %
Basophils Absolute: 0.1 10*3/uL (ref 0–0.1)
Eosinophils Absolute: 0.2 10*3/uL (ref 0–0.7)
Eosinophils Relative: 3 %
HCT: 29 % — ABNORMAL LOW (ref 35.0–47.0)
HEMOGLOBIN: 8.8 g/dL — AB (ref 12.0–16.0)
Lymphocytes Relative: 28 %
Lymphs Abs: 1.7 10*3/uL (ref 1.0–3.6)
MCH: 28.3 pg (ref 26.0–34.0)
MCHC: 30.2 g/dL — ABNORMAL LOW (ref 32.0–36.0)
MCV: 93.5 fL (ref 80.0–100.0)
MONO ABS: 0.2 10*3/uL (ref 0.2–0.9)
Monocytes Relative: 3 %
NEUTROS PCT: 65 %
Neutro Abs: 3.9 10*3/uL (ref 1.4–6.5)
Platelets: 187 10*3/uL (ref 150–440)
RBC: 3.1 MIL/uL — AB (ref 3.80–5.20)
RDW: 17.4 % — ABNORMAL HIGH (ref 11.5–14.5)
WBC: 6.1 10*3/uL (ref 3.6–11.0)

## 2014-11-19 MED ORDER — HEPARIN SOD (PORK) LOCK FLUSH 100 UNIT/ML IV SOLN
500.0000 [IU] | Freq: Once | INTRAVENOUS | Status: AC | PRN
  Administered 2014-11-19: 500 [IU]
  Filled 2014-11-19: qty 5

## 2014-11-19 MED ORDER — SODIUM CHLORIDE 0.9 % IV SOLN
Freq: Once | INTRAVENOUS | Status: AC
  Administered 2014-11-19: 15:00:00 via INTRAVENOUS
  Filled 2014-11-19: qty 1000

## 2014-11-19 MED ORDER — VINORELBINE TARTRATE CHEMO INJECTION 50 MG/5ML
20.0000 mg/m2 | Freq: Once | INTRAVENOUS | Status: AC
  Administered 2014-11-19: 33 mg via INTRAVENOUS
  Filled 2014-11-19: qty 3.3

## 2014-11-19 MED ORDER — SODIUM CHLORIDE 0.9 % IV SOLN
Freq: Once | INTRAVENOUS | Status: AC
  Administered 2014-11-19: 15:00:00 via INTRAVENOUS
  Filled 2014-11-19: qty 4

## 2014-11-23 NOTE — Progress Notes (Signed)
Betty [facility]  [phone]) [phone] Fax:(336) [phone]     ID: Hilton Sinclair OB: 03-09-1946  MR#: [id number]  JYN#:[id number]  Patient Care Team: Arlis Porta., MD as PCP - General (Family Medicine)  CHIEF COMPLAINT/DIAGNOSIS:  Breast cancer likely stage IV disease given lung nodules with concurrent rising CA 27-29 levels March 2014. (initially presented with T2N0M0, Stage IIA right breast cancer status post total mastectomy and lymph node study at [facility] on 06/23/06. Tumor size was 3.4 cm. Grade III. Margins were negative. Five lymph nodes studies were negative. Oncotype DX score was 43, distance recurrence of 10 years of 29%. ER/PR positive. HER-2 negative.  Patient completed adjuvant chemotherapy with Adriamycin/Cytoxan x 4 cycles in August 2008. Started hormonal therapy with Femara in September 2008).  06/07/12 - had surgery for excision of non-healing wound in right mastectomy site, pathology negative for malignancy. Feb 2014 - PET/CT showed progressive lung nodules s/o metastatic disease. Mar 2014 - rising CA 27.29 upto 114.6. Then was on Faslodex injection 07/17/12 to Feb 2015.  CT C/A/P on 07/10/13 reported progression of the nodular pulmonary parenchymal metastasis in the right lower lobe and right middle lobe. Then started on Exemestane 25 mg PO daily + Afinitor 10 mg PO daily around end of March 2015. Unable to tolerate above treatment, started palliative chemotherapy with Halaven on 11/20/13. 06/11/14 - CT chest showed Progressive multifocal pulmonary metastases within the right lung and precarinal lymph node.   Started next-line chemo with Navelbine on 08/27/14.   HISTORY OF PRESENT ILLNESS:  Patient returns for continued oncology followup, she is on single agent Navelbine chemotherapy. States that the chronic weakness is same and is chronically wheelchair bound due to multiple medical issues. States that she wants to continue on with chemotherapy. No fever or  chills. She has chronic weakness and is chronically wheelchair bound due to multiple medical issues. Has chronic joint and bone pains. No nausea or vomiting.  REVIEW OF SYSTEMS:   ROS As in HPI above. In addition, no new headaches or focal weakness. No sore throat or dysphagia. No dizziness or palpitation. No abdominal pain, constipation, diarrhea, dysuria or hematuria. No bleeding symptoms. No polyuria polydipsia. PS ECOG 3.  PAST MEDICAL HISTORY: Past Medical History  Diagnosis Date  . Breast cancer metastasized to lung 09/07/2014  . 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  Hypertension.  History of diastolic dysfunction.  Arthritis.   Asthma.  History of upper GI bleeding due to NSAID overuse.  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  Stage IIA right breast cancer in mid February 2008.  PAST SURGICAL HISTORY:  Stage IIA right breast cancer status post mastectomy and node study in mid February 2008.  Right femur lesion on bone scan, biopsied at [facility] in March 2008, negative for malignancy.  Cholecystectomy.  Appendectomy.  Total abdominal hysterectomy with ovaries spared.  FAMILY HISTORY: Mother had history of uterine cancer, daughter with teratoma. Otherwise denies any breast or ovarian cancers in the family.  ADVANCED DIRECTIVES:   SOCIAL HISTORY: History  Substance Use Topics  . Smoking status: Not on file  . Smokeless tobacco: Not on file  . Alcohol Use: Not on file  States that she smoked for about 30 years but has now quit. Denies any regular alcohol intake. She has history of substance abuse in the past and is taking  Methadone at an outpatient clinic here.  Allergies  Allergen Reactions  . Ibuprofen Other (See Comments)    GI Distress  . Prilosec [Omeprazole] Other (See Comments)    Jerky Movements    Current Outpatient Prescriptions  Medication Sig  Dispense Refill  . ALBUTEROL IN Inhale 2 puffs into the lungs 4 (four) times daily as needed.    Marland Kitchen atenolol (TENORMIN) 25 MG tablet Take 12.5 mg by mouth daily.    . busPIRone (BUSPAR) 5 MG tablet Take 5 mg by mouth 3 (three) times daily.    Marland Kitchen exemestane (AROMASIN) 25 MG tablet Take 25 mg by mouth once.    . Fluticasone-Salmeterol (ADVAIR) 250-50 MCG/DOSE AEPB Inhale 1 puff into the lungs 2 (two) times daily as needed.    Marland Kitchen levothyroxine (SYNTHROID, LEVOTHROID) 50 MCG tablet Take 50 mcg by mouth daily before breakfast.    . ondansetron (ZOFRAN) 4 MG tablet Take 1 tablet (4 mg total) by mouth every 8 (eight) hours as needed for nausea or vomiting. 60 tablet 2  . pantoprazole (PROTONIX) 40 MG tablet Take 40 mg by mouth daily.    Marland Kitchen amoxicillin-clavulanate (AUGMENTIN) 875-125 MG per tablet Take 1 tablet by mouth 2 (two) times daily. (Patient not taking: Reported on 11/12/2014) 20 tablet 0  . aspirin EC 81 MG tablet Take 81 mg by mouth.    Marland Kitchen atorvastatin (LIPITOR) 40 MG tablet Take 40 mg by mouth.    . DULoxetine (CYMBALTA) 30 MG capsule Take 3 caps by mouth once daily. 90 capsule 6  . gabapentin (NEURONTIN) 300 MG capsule Take 337m morning and  Mid day, and 600 mg at bedtime 120 capsule 1  . sulfamethoxazole-trimethoprim (BACTRIM DS,SEPTRA DS) 800-160 MG per tablet      No current facility-administered medications for this visit.    OBJECTIVE: Filed Vitals:   11/12/14 1145  BP: 109/71  Pulse: 93  Temp: 98.7 F (37.1 C)     There is no weight on file to calculate BMI.    ECOG FS:3 - Symptomatic, >50% confined to bed  GENERAL: Patient is sitting in wheelchair, chronically very weak, otherwise alert and oriented and in no acute distress. No icterus. HEENT: EOMs intact. Oral exam negative for thrush or lesions.   CVS: S1S2, regular LUNGS: Bilaterally decreased BS overall, no rhonchi. ABDOMEN: Soft, nontender.    LAB RESULTS: WBC 5.0, Hb 9.9, plt 290, ANC 2.2.         Lab Results    Component Value Date   LABCA2 123.6* 01/22/2014    STUDIES: No results found. 02/12/14 - CT scan of the chest. IMPRESSION:  Several right lung metastases show slight decrease in size since previous study. Mild mediastinal lymphadenopathy in right paratracheal region, slightly increased compared to prior exam. New mild patchy airspace disease in posterior right upper lobe, suspicious for infectious or inflammatory etiology. Moderate hiatal hernia again noted.  06/11/14 - CT chest. IMPRESSION:  1. Progressive multifocal pulmonary metastases within the right lung.  2. Progressive precarinal lymph node consistent with metastaticdisease.  3. Resolved right upper lobe airspace disease.  ASSESSMENT / PLAN:   1. Recurrent metastatic Breast cancer, stage IV disease given lung nodules with concurrent rising CA 27-29 levels. Had surgery on 06/07/12 for excision of non-healing wound in right mastectomy site, pathology negative for malignancy. Feb 2014 PET/CT showed progressive lung nodules s/o metastatic disease and in March 2014 she had rising CA 27.29 upto 114.6 and therapy was changed from Femara to Faslodex injection  on 07/17/12. Jan 2015 again rising CA 27.29 level. CT C/A/P on 07/10/13 reported progression of right lung metastases, and was changed to Exemestane + Afinitor. Patient was unable to even tolerate lower dose Afinitor 5 mg. Then got Halaven chemotherapy. CT scan of the chest done on February 3 reported progression of right lung metastasis and precarinal lymph node. Started Navelbine on 08/27/14  -  Reviewed labs and d/w patient. She continues to have chronic weakness and poor PS ECOG 3 at least. Have again discussed further options including suppotive care/hospice versus continuing treatment, she wants to continue on Navelbine therapy. Will proceed with C3D8 Navelbine at current dose, labs and chemo at 1 week and at 3 weeks (3 weeks on and 1 week off cycle). Next MD f/u in 4 weeks with labs.   2.  Hypomagnesemia -  Continue to monitor and supplement accordingly. 3. Anemia likely secondary to chemotherapy effect and anemia of chronic disease - Hb steady at 9.9, patient is nonambulatory, denies progressive anemia symptoms at rest. Continue to monitor. 4. Pain - She is on chronic opioid medication and goes to methadone clinic for her prescription. In between visits, patient advised to call or come to ER in case of any new symptoms or acute sickness. She is agreeable to this plan.    Leia Alf, MD   11/23/2014 8:07 PM

## 2014-12-03 ENCOUNTER — Inpatient Hospital Stay: Payer: Medicare Other

## 2014-12-03 VITALS — BP 106/67 | HR 88 | Temp 96.2°F | Resp 18

## 2014-12-03 DIAGNOSIS — C50911 Malignant neoplasm of unspecified site of right female breast: Secondary | ICD-10-CM

## 2014-12-03 DIAGNOSIS — Z5111 Encounter for antineoplastic chemotherapy: Secondary | ICD-10-CM | POA: Diagnosis not present

## 2014-12-03 DIAGNOSIS — C7800 Secondary malignant neoplasm of unspecified lung: Principal | ICD-10-CM

## 2014-12-03 LAB — CBC WITH DIFFERENTIAL/PLATELET
BASOS PCT: 2 %
Basophils Absolute: 0.1 10*3/uL (ref 0–0.1)
Eosinophils Absolute: 0.1 10*3/uL (ref 0–0.7)
Eosinophils Relative: 3 %
HCT: 32.2 % — ABNORMAL LOW (ref 35.0–47.0)
HEMOGLOBIN: 9.8 g/dL — AB (ref 12.0–16.0)
LYMPHS ABS: 1.9 10*3/uL (ref 1.0–3.6)
LYMPHS PCT: 50 %
MCH: 28.4 pg (ref 26.0–34.0)
MCHC: 30.3 g/dL — ABNORMAL LOW (ref 32.0–36.0)
MCV: 93.6 fL (ref 80.0–100.0)
Monocytes Absolute: 0.5 10*3/uL (ref 0.2–0.9)
Monocytes Relative: 12 %
NEUTROS ABS: 1.3 10*3/uL — AB (ref 1.4–6.5)
Neutrophils Relative %: 33 %
PLATELETS: 355 10*3/uL (ref 150–440)
RBC: 3.44 MIL/uL — ABNORMAL LOW (ref 3.80–5.20)
RDW: 17.6 % — AB (ref 11.5–14.5)
WBC: 3.8 10*3/uL (ref 3.6–11.0)

## 2014-12-03 MED ORDER — SODIUM CHLORIDE 0.9 % IJ SOLN
10.0000 mL | INTRAMUSCULAR | Status: DC | PRN
  Administered 2014-12-03: 10 mL
  Filled 2014-12-03: qty 10

## 2014-12-03 MED ORDER — SODIUM CHLORIDE 0.9 % IV SOLN
Freq: Once | INTRAVENOUS | Status: AC
  Administered 2014-12-03: 15:00:00 via INTRAVENOUS
  Filled 2014-12-03: qty 4

## 2014-12-03 MED ORDER — HEPARIN SOD (PORK) LOCK FLUSH 100 UNIT/ML IV SOLN
500.0000 [IU] | Freq: Once | INTRAVENOUS | Status: AC | PRN
  Administered 2014-12-03: 500 [IU]
  Filled 2014-12-03: qty 5

## 2014-12-03 MED ORDER — SODIUM CHLORIDE 0.9 % IV SOLN
Freq: Once | INTRAVENOUS | Status: AC
Start: 2014-12-03 — End: 2014-12-03
  Administered 2014-12-03: 14:00:00 via INTRAVENOUS
  Filled 2014-12-03: qty 1000

## 2014-12-03 MED ORDER — VINORELBINE TARTRATE CHEMO INJECTION 50 MG/5ML
20.0000 mg/m2 | Freq: Once | INTRAVENOUS | Status: AC
  Administered 2014-12-03: 33 mg via INTRAVENOUS
  Filled 2014-12-03: qty 3.3

## 2014-12-04 ENCOUNTER — Telehealth: Payer: Self-pay | Admitting: *Deleted

## 2014-12-04 DIAGNOSIS — R51 Headache: Secondary | ICD-10-CM

## 2014-12-04 DIAGNOSIS — C7800 Secondary malignant neoplasm of unspecified lung: Secondary | ICD-10-CM

## 2014-12-04 DIAGNOSIS — R413 Other amnesia: Secondary | ICD-10-CM

## 2014-12-04 DIAGNOSIS — R519 Headache, unspecified: Secondary | ICD-10-CM

## 2014-12-04 DIAGNOSIS — C50919 Malignant neoplasm of unspecified site of unspecified female breast: Secondary | ICD-10-CM

## 2014-12-04 LAB — CA 27.29 (SERIAL MONITOR): CA 27.29: 253 U/mL — ABNORMAL HIGH (ref 0.0–38.6)

## 2014-12-04 NOTE — Telephone Encounter (Signed)
Requesting that a CT be scheduled because she is having memory lapses. Her vision has changed, her distant vision has become blurry while close vision has improved. She does report that she has ery mild headaches freq to occasional. Denies increased  weakness of extremities on one side or the other.

## 2014-12-04 NOTE — Telephone Encounter (Signed)
CT Head ordered per VO Dr Mike Gip. Called and left message for pt that lab needs to be drawn and she is being scheduled for CT. Message sent to schedulers to schedule CT and Lab

## 2014-12-09 ENCOUNTER — Inpatient Hospital Stay: Payer: Medicare Other | Attending: Internal Medicine

## 2014-12-09 ENCOUNTER — Ambulatory Visit: Payer: Medicare Other | Attending: Hematology and Oncology

## 2014-12-09 DIAGNOSIS — Z993 Dependence on wheelchair: Secondary | ICD-10-CM | POA: Insufficient documentation

## 2014-12-09 DIAGNOSIS — C7801 Secondary malignant neoplasm of right lung: Secondary | ICD-10-CM | POA: Insufficient documentation

## 2014-12-09 DIAGNOSIS — K449 Diaphragmatic hernia without obstruction or gangrene: Secondary | ICD-10-CM | POA: Insufficient documentation

## 2014-12-09 DIAGNOSIS — Z7982 Long term (current) use of aspirin: Secondary | ICD-10-CM | POA: Insufficient documentation

## 2014-12-09 DIAGNOSIS — Z170 Estrogen receptor positive status [ER+]: Secondary | ICD-10-CM | POA: Insufficient documentation

## 2014-12-09 DIAGNOSIS — Z8049 Family history of malignant neoplasm of other genital organs: Secondary | ICD-10-CM | POA: Insufficient documentation

## 2014-12-09 DIAGNOSIS — Z8614 Personal history of Methicillin resistant Staphylococcus aureus infection: Secondary | ICD-10-CM | POA: Insufficient documentation

## 2014-12-09 DIAGNOSIS — M129 Arthropathy, unspecified: Secondary | ICD-10-CM | POA: Insufficient documentation

## 2014-12-09 DIAGNOSIS — M2550 Pain in unspecified joint: Secondary | ICD-10-CM | POA: Insufficient documentation

## 2014-12-09 DIAGNOSIS — D649 Anemia, unspecified: Secondary | ICD-10-CM | POA: Insufficient documentation

## 2014-12-09 DIAGNOSIS — R531 Weakness: Secondary | ICD-10-CM | POA: Insufficient documentation

## 2014-12-09 DIAGNOSIS — N281 Cyst of kidney, acquired: Secondary | ICD-10-CM | POA: Insufficient documentation

## 2014-12-09 DIAGNOSIS — C50911 Malignant neoplasm of unspecified site of right female breast: Secondary | ICD-10-CM | POA: Insufficient documentation

## 2014-12-09 DIAGNOSIS — M898X9 Other specified disorders of bone, unspecified site: Secondary | ICD-10-CM | POA: Insufficient documentation

## 2014-12-09 DIAGNOSIS — Z87891 Personal history of nicotine dependence: Secondary | ICD-10-CM | POA: Insufficient documentation

## 2014-12-09 DIAGNOSIS — I10 Essential (primary) hypertension: Secondary | ICD-10-CM | POA: Insufficient documentation

## 2014-12-09 DIAGNOSIS — Z79899 Other long term (current) drug therapy: Secondary | ICD-10-CM | POA: Insufficient documentation

## 2014-12-09 DIAGNOSIS — R413 Other amnesia: Secondary | ICD-10-CM | POA: Insufficient documentation

## 2014-12-09 DIAGNOSIS — R51 Headache: Secondary | ICD-10-CM | POA: Insufficient documentation

## 2014-12-09 DIAGNOSIS — R590 Localized enlarged lymph nodes: Secondary | ICD-10-CM | POA: Insufficient documentation

## 2014-12-09 DIAGNOSIS — Z9011 Acquired absence of right breast and nipple: Secondary | ICD-10-CM | POA: Insufficient documentation

## 2014-12-09 DIAGNOSIS — Z9221 Personal history of antineoplastic chemotherapy: Secondary | ICD-10-CM | POA: Insufficient documentation

## 2014-12-09 DIAGNOSIS — J45909 Unspecified asthma, uncomplicated: Secondary | ICD-10-CM | POA: Insufficient documentation

## 2014-12-10 ENCOUNTER — Inpatient Hospital Stay: Payer: Medicare Other | Admitting: Internal Medicine

## 2014-12-10 ENCOUNTER — Inpatient Hospital Stay: Payer: Medicare Other

## 2014-12-12 ENCOUNTER — Other Ambulatory Visit: Payer: Self-pay | Admitting: Family Medicine

## 2014-12-12 MED ORDER — PANTOPRAZOLE SODIUM 40 MG PO TBEC: 40.0000 mg | DELAYED_RELEASE_TABLET | Freq: Every day | ORAL | Status: DC

## 2014-12-12 MED ORDER — ATENOLOL 25 MG PO TABS: 12.5000 mg | ORAL_TABLET | Freq: Every day | ORAL | Status: AC

## 2014-12-15 ENCOUNTER — Telehealth: Payer: Self-pay | Admitting: Internal Medicine

## 2014-12-15 NOTE — Telephone Encounter (Signed)
error 

## 2014-12-16 ENCOUNTER — Telehealth: Payer: Self-pay | Admitting: *Deleted

## 2014-12-16 ENCOUNTER — Other Ambulatory Visit: Payer: Self-pay | Admitting: Family Medicine

## 2014-12-16 DIAGNOSIS — C50911 Malignant neoplasm of unspecified site of right female breast: Secondary | ICD-10-CM

## 2014-12-16 DIAGNOSIS — C7800 Secondary malignant neoplasm of unspecified lung: Principal | ICD-10-CM

## 2014-12-16 MED ORDER — BUSPIRONE HCL 5 MG PO TABS: 5.0000 mg | ORAL_TABLET | Freq: Three times a day (TID) | ORAL | Status: DC

## 2014-12-16 NOTE — Telephone Encounter (Signed)
Pt had called into scheduling to reschedule her appt that she missed.  I spoke with pandit and he said that we need to call her.  Her ca 27.29 was elevated and that she had not had much quality of life while on treatment and wondered if she would like to pursue chemo or would she like hospice.  I called and spoke to pt. Told her about tumor marker and if she wanted to pursue chemo then we will need to get scan to see where cancer status is.  If she felt that the side effects of chemo was more than she wanted then we could go with hospice.  She just feels if she chooses hospice then she is giving up and nothing more will be done and she wants to keep going and trying to beat this cancer.  She did also miss the ct of the head that was ordered when pt called in about her short term memory is failing.  I told her that I will enter  In ct scan and check with pandit about the ct of head and once it is sch. Then she will need appt next day to see pandit and discuss results and she is ok with that.

## 2014-12-17 ENCOUNTER — Other Ambulatory Visit: Payer: Self-pay | Admitting: Internal Medicine

## 2014-12-17 DIAGNOSIS — C50911 Malignant neoplasm of unspecified site of right female breast: Secondary | ICD-10-CM

## 2014-12-17 DIAGNOSIS — C7800 Secondary malignant neoplasm of unspecified lung: Principal | ICD-10-CM

## 2014-12-19 ENCOUNTER — Ambulatory Visit
Admission: RE | Admit: 2014-12-19 | Discharge: 2014-12-19 | Disposition: A | Discharge status: Home / Self Care | Payer: Medicare Other | Source: Ambulatory Visit | Attending: Internal Medicine | Admitting: Internal Medicine

## 2014-12-19 DIAGNOSIS — R590 Localized enlarged lymph nodes: Secondary | ICD-10-CM | POA: Insufficient documentation

## 2014-12-19 DIAGNOSIS — C50911 Malignant neoplasm of unspecified site of right female breast: Secondary | ICD-10-CM | POA: Insufficient documentation

## 2014-12-19 DIAGNOSIS — C7800 Secondary malignant neoplasm of unspecified lung: Secondary | ICD-10-CM

## 2014-12-19 DIAGNOSIS — C7801 Secondary malignant neoplasm of right lung: Secondary | ICD-10-CM | POA: Insufficient documentation

## 2014-12-19 DIAGNOSIS — K449 Diaphragmatic hernia without obstruction or gangrene: Secondary | ICD-10-CM | POA: Diagnosis not present

## 2014-12-19 HISTORY — DX: Essential (primary) hypertension: I10

## 2014-12-19 MED ORDER — IOHEXOL 350 MG/ML SOLN
75.0000 mL | Freq: Once | INTRAVENOUS | Status: AC | PRN
  Administered 2014-12-19: 75 mL via INTRAVENOUS

## 2014-12-22 ENCOUNTER — Inpatient Hospital Stay (HOSPITAL_BASED_OUTPATIENT_CLINIC_OR_DEPARTMENT_OTHER): Payer: Medicare Other | Admitting: Internal Medicine

## 2014-12-22 VITALS — BP 127/76 | HR 103 | Temp 98.2°F | Resp 18 | Ht 64.0 in

## 2014-12-22 DIAGNOSIS — Z170 Estrogen receptor positive status [ER+]: Secondary | ICD-10-CM | POA: Diagnosis not present

## 2014-12-22 DIAGNOSIS — C7801 Secondary malignant neoplasm of right lung: Secondary | ICD-10-CM | POA: Diagnosis not present

## 2014-12-22 DIAGNOSIS — Z8614 Personal history of Methicillin resistant Staphylococcus aureus infection: Secondary | ICD-10-CM

## 2014-12-22 DIAGNOSIS — R413 Other amnesia: Secondary | ICD-10-CM

## 2014-12-22 DIAGNOSIS — K449 Diaphragmatic hernia without obstruction or gangrene: Secondary | ICD-10-CM

## 2014-12-22 DIAGNOSIS — M898X9 Other specified disorders of bone, unspecified site: Secondary | ICD-10-CM

## 2014-12-22 DIAGNOSIS — Z993 Dependence on wheelchair: Secondary | ICD-10-CM

## 2014-12-22 DIAGNOSIS — Z87891 Personal history of nicotine dependence: Secondary | ICD-10-CM | POA: Diagnosis not present

## 2014-12-22 DIAGNOSIS — R590 Localized enlarged lymph nodes: Secondary | ICD-10-CM

## 2014-12-22 DIAGNOSIS — I10 Essential (primary) hypertension: Secondary | ICD-10-CM | POA: Diagnosis not present

## 2014-12-22 DIAGNOSIS — Z9011 Acquired absence of right breast and nipple: Secondary | ICD-10-CM | POA: Diagnosis not present

## 2014-12-22 DIAGNOSIS — Z79899 Other long term (current) drug therapy: Secondary | ICD-10-CM | POA: Diagnosis not present

## 2014-12-22 DIAGNOSIS — R531 Weakness: Secondary | ICD-10-CM | POA: Diagnosis not present

## 2014-12-22 DIAGNOSIS — Z8049 Family history of malignant neoplasm of other genital organs: Secondary | ICD-10-CM | POA: Diagnosis not present

## 2014-12-22 DIAGNOSIS — J45909 Unspecified asthma, uncomplicated: Secondary | ICD-10-CM

## 2014-12-22 DIAGNOSIS — M129 Arthropathy, unspecified: Secondary | ICD-10-CM | POA: Diagnosis not present

## 2014-12-22 DIAGNOSIS — Z9221 Personal history of antineoplastic chemotherapy: Secondary | ICD-10-CM

## 2014-12-22 DIAGNOSIS — M2550 Pain in unspecified joint: Secondary | ICD-10-CM

## 2014-12-22 DIAGNOSIS — D649 Anemia, unspecified: Secondary | ICD-10-CM

## 2014-12-22 DIAGNOSIS — C7800 Secondary malignant neoplasm of unspecified lung: Principal | ICD-10-CM

## 2014-12-22 DIAGNOSIS — Z7982 Long term (current) use of aspirin: Secondary | ICD-10-CM

## 2014-12-22 DIAGNOSIS — R51 Headache: Secondary | ICD-10-CM | POA: Diagnosis not present

## 2014-12-22 DIAGNOSIS — C50911 Malignant neoplasm of unspecified site of right female breast: Secondary | ICD-10-CM

## 2014-12-22 DIAGNOSIS — N281 Cyst of kidney, acquired: Secondary | ICD-10-CM | POA: Diagnosis not present

## 2014-12-26 ENCOUNTER — Ambulatory Visit: Payer: Medicare Other | Admitting: Internal Medicine

## 2015-01-01 ENCOUNTER — Telehealth: Payer: Self-pay | Admitting: *Deleted

## 2015-01-01 NOTE — Telephone Encounter (Signed)
Temp 99.5 headache light green drainage form nose left side will not clear when blows her  Nose. Asking for med to be calle din for her

## 2015-01-02 NOTE — Telephone Encounter (Signed)
Please call in a Z-pak (zithromycin) and notify her to let us know if no improvement. Thanks.

## 2015-01-02 NOTE — Telephone Encounter (Signed)
Called in zpak and contacted pt but spoke to her son and let him know that a zpak has been called in for her sinus infection.

## 2015-01-07 ENCOUNTER — Telehealth: Payer: Self-pay | Admitting: *Deleted

## 2015-01-07 NOTE — Telephone Encounter (Signed)
rcvd call from pharmacy and because the prior auth could not be done on line.  The regimen is ibrance and letrozole or Ibrance and faslodex and pt has already had letrozole in past as well as faslodex and she progressed with her breast cancer.  Dr. [name] wanted her to be on ibrance and exemestane and I spoke to representative and she has the ability to enter in the additional info as to why pt can't take the regimen rec:.  The auth now complete and will go to review for determination.

## 2015-01-07 NOTE — Progress Notes (Signed)
[facility]  Telephone:(336) [phone] Fax:(336) [phone]     ID: Betty Edwards OB: 04/30/1946  MR#: [id number]  KZS#:[id number]  Patient Care Team: Arlis Porta., MD as PCP - General (Family Medicine)  CHIEF COMPLAINT/DIAGNOSIS:  Breast cancer likely stage IV disease given lung nodules with concurrent rising CA 27-29 levels March 2014. (initially presented with T2N0M0, Stage IIA right breast cancer status post total mastectomy and lymph node study at [facility] on 06/23/06. Tumor size was 3.4 cm. Grade III. Margins were negative. Five lymph nodes studies were negative. Oncotype DX score was 43, distance recurrence of 10 years of 29%. ER/PR positive. HER-2 negative.  Patient completed adjuvant chemotherapy with Adriamycin/Cytoxan x 4 cycles in August 2008. Started hormonal therapy with Femara in September 2008).  06/07/12 - had surgery for excision of non-healing wound in right mastectomy site, pathology negative for malignancy. Feb 2014 - PET/CT showed progressive lung nodules s/o metastatic disease. Mar 2014 - rising CA 27.29 upto 114.6. Then was on Faslodex injection 07/17/12 to Feb 2015.  CT C/A/P on 07/10/13 reported progression of the nodular pulmonary parenchymal metastasis in the right lower lobe and right middle lobe. Then started on Exemestane 25 mg PO daily + Afinitor 10 mg PO daily around end of March 2015. Unable to tolerate above treatment, started palliative chemotherapy with Halaven on 11/20/13. 06/11/14 - CT chest showed Progressive multifocal pulmonary metastases within the right lung and precarinal lymph node.   Started next-line chemo with Navelbine on 08/27/14. 12/19/14 - CT chest reports increase in size of multifocal pulmonary nodules within the right lung. Patient here to plan next-line treatment.   HISTORY OF PRESENT ILLNESS:  Patient returns for continued oncology followup, she had recent CT scan on August 12 showing increase in the size of the nodules in the  right lung. Overall states that she is doing about the same.  States that the chronic weakness is same and is chronically wheelchair bound due to multiple medical issues. States that she wants to continue on with active cancer treatment.  She has chronic weakness and is chronically wheelchair bound due to multiple medical issues. Has chronic joint and bone pains. No nausea or vomiting. No fevers.  REVIEW OF SYSTEMS:   ROS As in HPI above. In addition, no chills or night sweats. Denies new headaches or focal weakness. No sore throat or dysphagia. No dizziness or palpitation. No abdominal pain, constipation, diarrhea, dysuria or hematuria. No bleeding symptoms. No polyuria polydipsia. PS ECOG 3.  PAST MEDICAL HISTORY: Past Medical History  Diagnosis Date  . Breast cancer metastasized to lung 09/07/2014  . Carcinoma of breast metastatic to lung 09/22/2014  . 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  Hypertension.  History of diastolic dysfunction.  Arthritis.   Asthma.  History of upper GI bleeding due to NSAID overuse.  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  Stage IIA right breast cancer in mid February 2008.  PAST SURGICAL HISTORY:  Stage IIA right breast cancer status post mastectomy and node study in mid February 2008.  Right femur lesion on bone scan, biopsied at [facility] in March 2008, negative for malignancy.  Cholecystectomy.  Appendectomy.  Total abdominal hysterectomy with ovaries spared.  FAMILY HISTORY: Mother had history of uterine cancer, daughter with teratoma. Otherwise denies any breast or ovarian cancers in the family.  SOCIAL HISTORY: Social History  Substance Use  Topics  . Smoking status: Not on file  . Smokeless tobacco: Not on file  . Alcohol Use: Not on file  States that she smoked for about 30 years but has now quit. Denies any regular alcohol intake. She has history of substance abuse  in the past and is taking Methadone at an outpatient clinic here.  Allergies  Allergen Reactions  . Ibuprofen Other (See Comments)    GI Distress  . Prilosec [Omeprazole] Other (See Comments)    Jerky Movements    Current Outpatient Prescriptions  Medication Sig Dispense Refill  . ALBUTEROL IN Inhale 2 puffs into the lungs 4 (four) times daily as needed.    Marland Kitchen aspirin EC 81 MG tablet Take 81 mg by mouth.    Marland Kitchen atenolol (TENORMIN) 25 MG tablet Take 0.5 tablets (12.5 mg total) by mouth daily. 45 tablet 3  . atorvastatin (LIPITOR) 40 MG tablet Take 40 mg by mouth.    . busPIRone (BUSPAR) 5 MG tablet Take 1 tablet (5 mg total) by mouth 3 (three) times daily. 90 tablet 3  . DULoxetine (CYMBALTA) 30 MG capsule Take 3 caps by mouth once daily. 90 capsule 6  . exemestane (AROMASIN) 25 MG tablet Take 25 mg by mouth once.    . Fluticasone-Salmeterol (ADVAIR) 250-50 MCG/DOSE AEPB Inhale 1 puff into the lungs 2 (two) times daily as needed.    . gabapentin (NEURONTIN) 300 MG capsule Take 379m morning and  Mid day, and 600 mg at bedtime 120 capsule 1  . levothyroxine (SYNTHROID, LEVOTHROID) 50 MCG tablet Take 50 mcg by mouth daily before breakfast.    . methadone (DOLOPHINE) 10 MG/ML solution Take 117 mg by mouth daily.    . ondansetron (ZOFRAN) 4 MG tablet Take 1 tablet (4 mg total) by mouth every 8 (eight) hours as needed for nausea or vomiting. 60 tablet 2  . pantoprazole (PROTONIX) 40 MG tablet Take 1 tablet (40 mg total) by mouth daily. 90 tablet 3  . amoxicillin-clavulanate (AUGMENTIN) 875-125 MG per tablet Take 1 tablet by mouth 2 (two) times daily. (Patient not taking: Reported on 11/12/2014) 20 tablet 0  . sulfamethoxazole-trimethoprim (BACTRIM DS,SEPTRA DS) 800-160 MG per tablet      No current facility-administered medications for this visit.    OBJECTIVE: Filed Vitals:   12/22/14 1456  BP: 127/76  Pulse: 103  Temp: 98.2 F (36.8 C)  Resp: 18     There is no weight on file to  calculate BMI.    ECOG FS:3 - Symptomatic, >50% confined to bed  GENERAL: Patient is sitting in wheelchair, chronically very weak, otherwise alert and oriented and in no acute distress. No icterus. HEENT: EOMs intact. Oral exam negative for thrush or lesions.   CVS: S1S2, regular LUNGS: Bilaterally decreased BS overall, no rhonchi. ABDOMEN: Soft, nontender.    LAB RESULTS:        Lab Results  Component Value Date   LABCA2 253.0* 12/03/2014    STUDIES: Ct Head W Wo Contrast  12/19/2014   CLINICAL DATA:  Metastatic breast cancer restaging. Memory loss and headache.  EXAM: CT HEAD WITHOUT AND WITH CONTRAST  TECHNIQUE: Contiguous axial images were obtained from the base of the skull through the vertex without and with intravenous contrast  CONTRAST:  737mOMNIPAQUE IOHEXOL 350 MG/ML SOLN  COMPARISON:  Noncontrast head CT 11/26/2013  FINDINGS: The ventricles and sulci are within normal limits for age. There is no evidence of acute infarct, intracranial hemorrhage, mass, midline shift,  or extra-axial collection. No abnormal enhancement is identified.  The orbits are unremarkable. There is mild right frontal sinus and right ethmoid air cell mucosal thickening. The visualized mastoid air cells are clear. No destructive osseous lesions are seen.  IMPRESSION: Unremarkable head CT.  No evidence of intracranial metastases.   Electronically Signed   By: Logan Bores   On: 12/19/2014 13:40   Ct Chest W Contrast  12/19/2014   CLINICAL DATA:  Restaging right breast cancer with pulmonary metastasis.  EXAM: CT CHEST WITH CONTRAST  TECHNIQUE: Multidetector CT imaging of the chest was performed during intravenous contrast administration.  CONTRAST:  65m OMNIPAQUE IOHEXOL 350 MG/ML SOLN  COMPARISON:  06/11/2013  FINDINGS: Mediastinum: Normal heart size. The trachea appears patent and is midline. Large hiatal hernia identified. Enlarged right paratracheal lymph node measures 2 cm, image 22/series 2. Previously 1.8  cm.  Lungs/Pleura: No pleural effusion. Mild changes of centrilobular emphysema. Multiple pulmonary lesions are identified within the right lung. Index nodule within the right upper lobe measures 1.6 cm, image 31/series 3. Previously 1.3 cm. Index lesion within the right middle lobe measures 4.5 cm, image 37/series 3. Previously 2.6 cm. Index lesion within the anterior right middle lobe measures 2.4 cm, image 37/ series 3. Previously 1.6 cm. Right base nodule measures 2.2 cm, image 43/series 3. Previously 1.6 cm. Posterior right lower lobe nodule measures 2.1 cm, image 43/series 3. Previously 1.5 cm. No suspicious nodules identified within the left lung.  Upper Abdomen: No suspicious liver lesion identified. The visualized adrenal glands are normal. The visualized portions of the spleen are on unremarkable. There is an intermediate to hyperdense lesion within the right kidney measuring 1.3 cm and 40 Hounsfield units. Unchanged from previous exam.  Musculoskeletal: No aggressive lytic or sclerotic bone lesions identified. Degenerative type changes are noted involving the right glenohumeral joint.  IMPRESSION: 1. Multi focal pulmonary nodules within the right lung have increased in size from previous exam. 2. Similar appearance of enlarged right paratracheal lymph node. 3. Large hiatal hernia.   Electronically Signed   By: TKerby MoorsM.D.   On: 12/19/2014 15:06   02/12/14 - CT scan of the chest. IMPRESSION:  Several right lung metastases show slight decrease in size since previous study. Mild mediastinal lymphadenopathy in right paratracheal region, slightly increased compared to prior exam. New mild patchy airspace disease in posterior right upper lobe, suspicious for infectious or inflammatory etiology. Moderate hiatal hernia again noted.  06/11/14 - CT chest. IMPRESSION:  1. Progressive multifocal pulmonary metastases within the right lung.  2. Progressive precarinal lymph node consistent with  metastaticdisease.  3. Resolved right upper lobe airspace disease.  ASSESSMENT / PLAN:   1. Recurrent metastatic Breast cancer, stage IV disease given lung nodules with concurrent rising CA 27-29 levels. Had surgery on 06/07/12 for excision of non-healing wound in right mastectomy site, pathology negative for malignancy. Feb 2014 PET/CT showed progressive lung nodules s/o metastatic disease and in March 2014 she had rising CA 27.29 upto 114.6 and therapy was changed from Femara to Faslodex injection on 07/17/12. Jan 2015 again rising CA 27.29 level. CT C/A/P on 07/10/13 reported progression of right lung metastases, and was changed to Exemestane + Afinitor. Patient was unable to even tolerate lower dose Afinitor 5 mg. Then got Halaven chemotherapy. CT scan of the chest done on February 3 reported progression of right lung metastasis and precarinal lymph node. Started Navelbine on 08/27/14  -  Reviewed recent CT scan from August 12  and d/w patient, and explained that that is progression of right lower lung nodules indicative of progression of metastatic malignancy. She continues to have chronic weakness and poor PS ECOG 3 at least. Have again discussed further options including suppotive care/hospice versus continuing treatment, she wants to continue on active cancer treatment, she is a poor candidate for chemotherapy. Plan therefore is to continue on exemestane 25 mg by mouth daily and add lower dose of targeted therapy with Ibrance (75 mg, 3 weeks on and one-week off cycles). Patient advised about palliative intent off treatment, overall response rates and possible side effects, she is agreeable to take this treatment and expressed verbal consent. Will see her back in 4 weeks for continued follow-up with labs and next Port flush.    2. Hypomagnesemia - continue to monitor and supplement accordingly. 3. Anemia likely secondary to chemotherapy effect and anemia of chronic disease - Hb fluctuates in a steady range  between 8.9-9.9, patient is nonambulatory, denies progressive anemia symptoms at rest. Continue to monitor. 4. Pain - She is on chronic opioid medication and goes to methadone clinic for her prescription. 5. In between visits, patient advised to call or come to ER in case of any new symptoms or acute sickness. She is agreeable to this plan.    Leia Alf, MD   01/07/2015 5:09 PM

## 2015-01-14 ENCOUNTER — Other Ambulatory Visit: Payer: Self-pay | Admitting: *Deleted

## 2015-01-14 MED ORDER — EXEMESTANE 25 MG PO TABS: 25.0000 mg | ORAL_TABLET | Freq: Once | ORAL | Status: DC

## 2015-01-19 ENCOUNTER — Inpatient Hospital Stay: Payer: Medicare Other | Admitting: Internal Medicine

## 2015-01-19 ENCOUNTER — Inpatient Hospital Stay: Payer: Medicare Other

## 2015-01-19 ENCOUNTER — Inpatient Hospital Stay: Payer: Medicare Other | Attending: Internal Medicine

## 2015-01-22 ENCOUNTER — Telehealth: Payer: Self-pay | Admitting: *Deleted

## 2015-01-22 ENCOUNTER — Other Ambulatory Visit: Payer: Self-pay | Admitting: *Deleted

## 2015-01-22 NOTE — Telephone Encounter (Signed)
Patient is on chronic Methadone and goes to methadone clinic for her prescription. She needs to contact the pain expert there. With regards to Breast cancer, her mets are in the right lower lung area which has not been causing pain so far. Thanks.

## 2015-01-22 NOTE — Telephone Encounter (Signed)
C/o pain in right lung posteriorly. Reports that she has been taking 3 tabs every 3 - 4 hours and it is controlling her pain of Tyleonol PM. Pharmacist told her she was taking too much Tylenol. She wants to know how much Tylenol PM she can take around the clock to control her pain without it being to much. She requests that [facility] please call her. She does not want to use narcotics until she absolutely has to take them. She wants [place] to call pharmacy to tell them how much she can take and have them deliver it to her home

## 2015-01-22 NOTE — Telephone Encounter (Signed)
Called and told patient that Dr [name] wants her to contact the clinic where she gets her methadone for an answer to this. SHe repeated this back to me and then said so did you call my pharmacy and I told her no

## 2015-01-23 ENCOUNTER — Other Ambulatory Visit: Payer: Medicare Other

## 2015-01-23 ENCOUNTER — Ambulatory Visit: Payer: Medicare Other | Admitting: Internal Medicine

## 2015-01-23 MED ORDER — ONDANSETRON HCL 4 MG PO TABS: 4.0000 mg | ORAL_TABLET | Freq: Three times a day (TID) | ORAL | Status: AC | PRN

## 2015-01-28 ENCOUNTER — Other Ambulatory Visit: Payer: Self-pay

## 2015-01-28 ENCOUNTER — Telehealth: Payer: Self-pay

## 2015-01-28 ENCOUNTER — Telehealth: Payer: Self-pay | Admitting: Family Medicine

## 2015-01-28 DIAGNOSIS — J410 Simple chronic bronchitis: Secondary | ICD-10-CM

## 2015-01-28 MED ORDER — LEVALBUTEROL TARTRATE 45 MCG/ACT IN AERO: 2.0000 | INHALATION_SPRAY | RESPIRATORY_TRACT | Status: DC | PRN

## 2015-01-28 NOTE — Telephone Encounter (Signed)
Go ahead and refill the Xoponex for 1 year as she is taking it.-jh

## 2015-01-28 NOTE — Telephone Encounter (Signed)
Or is there a different inhaler you want to use? Patient is ok but needs refills./ [facility]

## 2015-01-28 NOTE — Telephone Encounter (Signed)
Noted-jh 

## 2015-01-28 NOTE — Telephone Encounter (Signed)
Requesting Xoponex Inhaler refill. Do you need to see her to fill this? I guess the years worth in May did not go through as I thought it was written for a year.

## 2015-01-28 NOTE — Telephone Encounter (Signed)
Tried to call back and could not get answer. She wants refill but did not tell Rach if she is having any breathing issues. I work Set designer so please either send Geologist, engineering. Thanks,

## 2015-01-28 NOTE — Telephone Encounter (Signed)
Pt called requesting a refill on her  inhaler

## 2015-01-28 NOTE — Telephone Encounter (Signed)
Need to know the name of inhaler?

## 2015-02-06 ENCOUNTER — Telehealth: Payer: Self-pay

## 2015-02-06 ENCOUNTER — Telehealth: Payer: Self-pay | Admitting: *Deleted

## 2015-02-06 ENCOUNTER — Other Ambulatory Visit: Payer: Self-pay

## 2015-02-06 MED ORDER — ALBUTEROL SULFATE HFA 108 (90 BASE) MCG/ACT IN AERS: 2.0000 | INHALATION_SPRAY | Freq: Four times a day (QID) | RESPIRATORY_TRACT | Status: DC | PRN

## 2015-02-06 NOTE — Telephone Encounter (Signed)
States PMD is out of town and is asking if Dr [name] will refill her ventolin inhaler and call in Benadryl 25 mg tabs #100 (she knows this is an OTC med,but wants rx called in)

## 2015-02-06 NOTE — Telephone Encounter (Signed)
Ventolin is okay. Why is she on Benadryl especially needing #100? Thanks.

## 2015-02-06 NOTE — Telephone Encounter (Signed)
Patient called for new Rx inhaler as insurance would not cover new one. I found that [name] called that in and advised her to call them but call back if this is a problem and we will call it in so she will not wheeze. [facility]

## 2015-02-06 NOTE — Telephone Encounter (Signed)
Called patient and asked why she needs so much benadryl, she stated that she and her son are both sick and have been taking it and she was going to call her PMD for an appt next week for both of them. I told her that we refilled her inhaler, but will not order benadryl for her since it is OTC.

## 2015-02-09 ENCOUNTER — Inpatient Hospital Stay (HOSPITAL_BASED_OUTPATIENT_CLINIC_OR_DEPARTMENT_OTHER): Payer: Medicare Other | Admitting: Internal Medicine

## 2015-02-09 ENCOUNTER — Inpatient Hospital Stay: Payer: Medicare Other

## 2015-02-09 ENCOUNTER — Inpatient Hospital Stay: Payer: Medicare Other | Attending: Internal Medicine

## 2015-02-09 VITALS — BP 143/84 | HR 107 | Temp 97.9°F | Resp 18 | Ht 64.0 in

## 2015-02-09 DIAGNOSIS — Z79811 Long term (current) use of aromatase inhibitors: Secondary | ICD-10-CM

## 2015-02-09 DIAGNOSIS — L03115 Cellulitis of right lower limb: Secondary | ICD-10-CM | POA: Diagnosis not present

## 2015-02-09 DIAGNOSIS — F1120 Opioid dependence, uncomplicated: Secondary | ICD-10-CM

## 2015-02-09 DIAGNOSIS — Z9013 Acquired absence of bilateral breasts and nipples: Secondary | ICD-10-CM | POA: Diagnosis not present

## 2015-02-09 DIAGNOSIS — Z9221 Personal history of antineoplastic chemotherapy: Secondary | ICD-10-CM

## 2015-02-09 DIAGNOSIS — C50911 Malignant neoplasm of unspecified site of right female breast: Secondary | ICD-10-CM | POA: Insufficient documentation

## 2015-02-09 DIAGNOSIS — Z8614 Personal history of Methicillin resistant Staphylococcus aureus infection: Secondary | ICD-10-CM

## 2015-02-09 DIAGNOSIS — D649 Anemia, unspecified: Secondary | ICD-10-CM | POA: Insufficient documentation

## 2015-02-09 DIAGNOSIS — M2550 Pain in unspecified joint: Secondary | ICD-10-CM

## 2015-02-09 DIAGNOSIS — J45909 Unspecified asthma, uncomplicated: Secondary | ICD-10-CM

## 2015-02-09 DIAGNOSIS — I10 Essential (primary) hypertension: Secondary | ICD-10-CM | POA: Insufficient documentation

## 2015-02-09 DIAGNOSIS — M129 Arthropathy, unspecified: Secondary | ICD-10-CM | POA: Insufficient documentation

## 2015-02-09 DIAGNOSIS — R978 Other abnormal tumor markers: Secondary | ICD-10-CM | POA: Insufficient documentation

## 2015-02-09 DIAGNOSIS — Z170 Estrogen receptor positive status [ER+]: Secondary | ICD-10-CM | POA: Diagnosis not present

## 2015-02-09 DIAGNOSIS — M898X9 Other specified disorders of bone, unspecified site: Secondary | ICD-10-CM | POA: Insufficient documentation

## 2015-02-09 DIAGNOSIS — Z79899 Other long term (current) drug therapy: Secondary | ICD-10-CM | POA: Insufficient documentation

## 2015-02-09 DIAGNOSIS — R531 Weakness: Secondary | ICD-10-CM | POA: Insufficient documentation

## 2015-02-09 DIAGNOSIS — C7801 Secondary malignant neoplasm of right lung: Secondary | ICD-10-CM

## 2015-02-09 DIAGNOSIS — Z7982 Long term (current) use of aspirin: Secondary | ICD-10-CM | POA: Diagnosis not present

## 2015-02-09 DIAGNOSIS — C7800 Secondary malignant neoplasm of unspecified lung: Principal | ICD-10-CM

## 2015-02-09 LAB — CBC WITH DIFFERENTIAL/PLATELET
Basophils Absolute: 0.1 10*3/uL (ref 0–0.1)
Basophils Relative: 2 %
EOS ABS: 0.1 10*3/uL (ref 0–0.7)
EOS PCT: 2 %
HCT: 32.9 % — ABNORMAL LOW (ref 35.0–47.0)
HEMOGLOBIN: 10.1 g/dL — AB (ref 12.0–16.0)
LYMPHS ABS: 1.8 10*3/uL (ref 1.0–3.6)
LYMPHS PCT: 45 %
MCH: 27.5 pg (ref 26.0–34.0)
MCHC: 30.8 g/dL — AB (ref 32.0–36.0)
MCV: 89.3 fL (ref 80.0–100.0)
MONOS PCT: 8 %
Monocytes Absolute: 0.3 10*3/uL (ref 0.2–0.9)
NEUTROS PCT: 43 %
Neutro Abs: 1.7 10*3/uL (ref 1.4–6.5)
Platelets: 242 10*3/uL (ref 150–440)
RBC: 3.68 MIL/uL — AB (ref 3.80–5.20)
RDW: 18.5 % — ABNORMAL HIGH (ref 11.5–14.5)
WBC: 4 10*3/uL (ref 3.6–11.0)

## 2015-02-09 LAB — BASIC METABOLIC PANEL
Anion gap: 5 (ref 5–15)
BUN: 12 mg/dL (ref 6–20)
CHLORIDE: 102 mmol/L (ref 101–111)
CO2: 29 mmol/L (ref 22–32)
CREATININE: 0.69 mg/dL (ref 0.44–1.00)
Calcium: 8.1 mg/dL — ABNORMAL LOW (ref 8.9–10.3)
GFR calc Af Amer: >60 mL/min (ref >60)
GFR calc non Af Amer: >60 mL/min (ref >60)
GLUCOSE: 80 mg/dL (ref 65–99)
POTASSIUM: 3.9 mmol/L (ref 3.5–5.1)
SODIUM: 136 mmol/L (ref 135–145)

## 2015-02-09 LAB — HEPATIC FUNCTION PANEL
ALK PHOS: 92 U/L (ref 38–126)
ALT: 9 U/L — AB (ref 14–54)
AST: 16 U/L (ref 15–41)
Albumin: 3.5 g/dL (ref 3.5–5.0)
BILIRUBIN DIRECT: 0.1 mg/dL (ref 0.1–0.5)
BILIRUBIN INDIRECT: 0.3 mg/dL (ref 0.3–0.9)
BILIRUBIN TOTAL: 0.4 mg/dL (ref 0.3–1.2)
TOTAL PROTEIN: 6.6 g/dL (ref 6.5–8.1)

## 2015-02-09 LAB — MAGNESIUM: MAGNESIUM: 1.7 mg/dL (ref 1.7–2.4)

## 2015-02-09 LAB — PHOSPHORUS: Phosphorus: 4.3 mg/dL (ref 2.5–4.6)

## 2015-02-09 MED ORDER — PALBOCICLIB 125 MG PO CAPS: ORAL_CAPSULE | ORAL | Status: DC

## 2015-02-09 MED ORDER — DIPHENHYDRAMINE HCL 25 MG PO TABS: 25.0000 mg | ORAL_TABLET | Freq: Three times a day (TID) | ORAL | Status: AC | PRN

## 2015-02-09 MED ORDER — SODIUM CHLORIDE 0.9 % IJ SOLN
10.0000 mL | Freq: Once | INTRAMUSCULAR | Status: AC
  Administered 2015-02-09: 10 mL via INTRAVENOUS
  Filled 2015-02-09: qty 10

## 2015-02-09 MED ORDER — HEPARIN SOD (PORK) LOCK FLUSH 100 UNIT/ML IV SOLN
500.0000 [IU] | Freq: Once | INTRAVENOUS | Status: AC
  Administered 2015-02-09: 500 [IU] via INTRAVENOUS

## 2015-02-09 NOTE — Progress Notes (Signed)
Patient is here for follow-up of met. Breast cancer. Patient states that she has a constant pain in her back and rates her pain today a 7/10. She states that her appetite has been very good.

## 2015-02-16 ENCOUNTER — Other Ambulatory Visit: Payer: Self-pay | Admitting: Family Medicine

## 2015-02-16 MED ORDER — LEVOTHYROXINE SODIUM 50 MCG PO TABS: 50.0000 ug | ORAL_TABLET | Freq: Every day | ORAL | Status: DC

## 2015-02-17 NOTE — Progress Notes (Signed)
[place]  [phone] [phone]     ID: Betty Edwards OB: Mar 07, 1946  MR#: [id number]  TYO#:[id number]  Patient Care Team: Arlis Porta., MD as PCP - General (Family Medicine)  CHIEF COMPLAINT/DIAGNOSIS:  Breast cancer likely stage IV disease given lung nodules with concurrent rising CA 27-29 levels March 2014. (initially presented with T2N0M0, Stage IIA right breast cancer status post total mastectomy and lymph node study at [facility] on 06/23/06. Tumor size was 3.4 cm. Grade III. Margins were negative. Five lymph nodes studies were negative. Oncotype DX score was 43, distance recurrence of 10 years of 29%. ER/PR positive. HER-2 negative.  Patient completed adjuvant chemotherapy with Adriamycin/Cytoxan x 4 cycles in August 2008. Started hormonal therapy with Femara in September 2008).  06/07/12 - had surgery for excision of non-healing wound in right mastectomy site, pathology negative for malignancy. Feb 2014 - PET/CT showed progressive lung nodules s/o metastatic disease. Mar 2014 - rising CA 27.29 upto 114.6. Then was on Faslodex injection 07/17/12 to Feb 2015.  CT C/A/P on 07/10/13 reported progression of the nodular pulmonary parenchymal metastasis in the right lower lobe and right middle lobe. Then started on Exemestane 25 mg PO daily + Afinitor 10 mg PO daily around end of March 2015. Unable to tolerate above treatment, started palliative chemotherapy with Halaven on 11/20/13. 06/11/14 - CT chest showed Progressive multifocal pulmonary metastases within the right lung and precarinal lymph node.   Started next-line chemo with Navelbine on 08/27/14. July 2016 - serum CA 27-29 level increased to 253. 12/19/14 - CT chest reports increase in size of multifocal pulmonary nodules within the right lung. Treatment changed to  Ibrance + Exemestane.   HISTORY OF PRESENT ILLNESS:  Patient returns for continued oncology evaluation, states that she is taking oral  Ibrance + exemestane. She denies any new side effects from this and is agreeable to increase dose of Ibrance. followup, she had recent CT scan on August 12 showing increase in the size of the nodules in the right lung. States that the chronic weakness is same and is chronically wheelchair bound due to multiple medical issues. Has chronic joint and bone pains. No nausea or vomiting. No fevers. Denies headaches or focal weakness.  REVIEW OF SYSTEMS:   ROS As in HPI above. In addition, no fevers, chills or night sweats. Denies new headaches or focal weakness. No sore throat or dysphagia. No dizziness or palpitation. No abdominal pain, constipation, diarrhea, dysuria or hematuria. No bleeding symptoms. No polyuria polydipsia. PS ECOG 3.  PAST MEDICAL HISTORY: Past Medical History  Diagnosis Date  . Breast cancer metastasized to lung 09/07/2014  . Carcinoma of breast metastatic to lung 09/22/2014  . 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  Hypertension.  History of diastolic dysfunction.  Arthritis.   Asthma.  History of upper GI bleeding due to NSAID overuse.  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  Stage IIA right breast cancer in mid February 2008.  PAST SURGICAL HISTORY:  Stage IIA right breast cancer status post mastectomy and node study in mid February 2008.  Right femur lesion on bone scan, biopsied at [facility] in March 2008, negative for malignancy.  Cholecystectomy.  Appendectomy.  Total abdominal hysterectomy with ovaries spared.  FAMILY HISTORY: Mother had history of uterine cancer, daughter with teratoma. Otherwise denies any breast or ovarian cancers in the family.  SOCIAL HISTORY: Social History  Substance Use Topics  . Smoking status: Not on file  . Smokeless tobacco: Not on file  . Alcohol Use: Not on file  States that she smoked for about 30 years but has now quit. Denies any regular alcohol  intake. She has history of substance abuse in the past and is taking Methadone at an outpatient clinic here.  Allergies  Allergen Reactions  . Ibuprofen Other (See Comments)    GI Distress  . Prilosec [Omeprazole] Other (See Comments)    Jerky Movements  . Tramadol Nausea Only    Current Outpatient Prescriptions  Medication Sig Dispense Refill  . albuterol (PROVENTIL HFA;VENTOLIN HFA) 108 (90 BASE) MCG/ACT inhaler Inhale 2 puffs into the lungs every 6 (six) hours as needed for wheezing or shortness of breath. 1 Inhaler 2  . ALBUTEROL IN Inhale 2 puffs into the lungs 4 (four) times daily as needed.    Marland Kitchen amoxicillin-clavulanate (AUGMENTIN) 875-125 MG per tablet Take 1 tablet by mouth 2 (two) times daily. 20 tablet 0  . aspirin EC 81 MG tablet Take 81 mg by mouth.    Marland Kitchen atenolol (TENORMIN) 25 MG tablet Take 0.5 tablets (12.5 mg total) by mouth daily. 45 tablet 3  . atorvastatin (LIPITOR) 40 MG tablet Take 40 mg by mouth.    . busPIRone (BUSPAR) 5 MG tablet Take 1 tablet (5 mg total) by mouth 3 (three) times daily. 90 tablet 3  . DULoxetine (CYMBALTA) 30 MG capsule Take 3 caps by mouth once daily. 90 capsule 6  . exemestane (AROMASIN) 25 MG tablet Take 1 tablet (25 mg total) by mouth once. 30 tablet 1  . Fluticasone-Salmeterol (ADVAIR) 250-50 MCG/DOSE AEPB Inhale 1 puff into the lungs 2 (two) times daily as needed.    . gabapentin (NEURONTIN) 300 MG capsule Take 34m morning and  Mid day, and 600 mg at bedtime 120 capsule 1  . levalbuterol (XOPENEX HFA) 45 MCG/ACT inhaler Inhale 2 puffs into the lungs every 4 (four) hours as needed for wheezing or shortness of breath. 1 Inhaler 12  . methadone (DOLOPHINE) 10 MG/ML solution Take 117 mg by mouth daily.    . ondansetron (ZOFRAN) 4 MG tablet Take 1 tablet (4 mg total) by mouth every 8 (eight) hours as needed for nausea or vomiting. 60 tablet 2  . pantoprazole (PROTONIX) 40 MG tablet Take 1 tablet (40 mg total) by mouth daily. 90 tablet 3  .  sulfamethoxazole-trimethoprim (BACTRIM DS,SEPTRA DS) 800-160 MG per tablet     . diphenhydrAMINE (BENADRYL) 25 MG tablet Take 1 tablet (25 mg total) by mouth every 8 (eight) hours as needed for itching. 90 tablet 0  . levothyroxine (SYNTHROID, LEVOTHROID) 50 MCG tablet Take 1 tablet (50 mcg total) by mouth daily before breakfast. 30 tablet 3  . palbociclib (IBRANCE) 125 MG capsule Take 1 capsule (125 mg total) by mouth daily with breakfast for 3 weeks on and 1 week off cycles. Take whole with food. 21 capsule 4   No current facility-administered medications for this visit.    OBJECTIVE: Filed Vitals:   02/09/15 1437  BP: 143/84  Pulse: 107  Temp: 97.9 F (36.6 C)  Resp: 18     There is no weight on file to calculate BMI.    ECOG FS:3 - Symptomatic, >50% confined to bed  GENERAL: Patient is sitting in wheelchair, chronically very weak, otherwise alert and oriented and in no acute distress. No icterus. HEENT: EOMs intact. Oral exam negative  for thrush or lesions.   CVS: S1S2, regular LUNGS: Bilaterally decreased BS overall, no rhonchi. ABDOMEN: Soft, nontender.  EXTREMITIES: No new edema. Has history of chronic wound/infection and has dressing.   LAB RESULTS:        Lab Results  Component Value Date   LABCA2 253.0* 12/03/2014    STUDIES: No results found. 02/12/14 - CT scan of the chest. IMPRESSION:  Several right lung metastases show slight decrease in size since previous study. Mild mediastinal lymphadenopathy in right paratracheal region, slightly increased compared to prior exam. New mild patchy airspace disease in posterior right upper lobe, suspicious for infectious or inflammatory etiology. Moderate hiatal hernia again noted.  06/11/14 - CT chest. IMPRESSION:  1. Progressive multifocal pulmonary metastases within the right lung.  2. Progressive precarinal lymph node consistent with metastaticdisease.  3. Resolved right upper lobe airspace disease.  ASSESSMENT / PLAN:   1.  Recurrent metastatic Breast cancer, stage IV disease given lung nodules with concurrent rising CA 27-29 levels. Had surgery on 06/07/12 for excision of non-healing wound in right mastectomy site, pathology negative for malignancy. Feb 2014 PET/CT showed progressive lung nodules s/o metastatic disease and in March 2014 she had rising CA 27.29 upto 114.6 and therapy was changed from Femara to Faslodex injection on 07/17/12. Jan 2015 again rising CA 27.29 level. CT C/A/P on 07/10/13 reported progression of right lung metastases, and was changed to Exemestane + Afinitor. Patient was unable to even tolerate lower dose Afinitor 5 mg. Then got Halaven chemotherapy. CT scan of the chest done on February 3 reported progression of right lung metastasis and precarinal lymph node. Started Navelbine on 08/27/14. July 2016 - serum CA 27-29 level increased to 253. On 12/19/14 - CT chest reports increase in size of multifocal pulmonary nodules within the right lung.  Treatment changed to  Ibrance + Exemestane    -    Have reviewed labs and discussed with patient. Overall she continues to have poor performance status which is chronic finding and is unchanged. Otherwise she seems to be tolerating current treatment regimen without any new side effects. Have recommended increasing dose of Ibrance, she is agreeable, will increase to Ibrance 125 mg/day (3 weeks on and one-week off cycles) and continue Exemestane 25 mg by mouth daily. Lab at 2 weeks, next MD follow-up at 4 weeks for continued close monitoring for side effects and then plan scans to assess response to treatment.     2. Hypomagnesemia - improved, continue to monitor and supplement accordingly. 3. Anemia likely secondary to chemotherapy effect and anemia of chronic disease - Hb fluctuates in a steady range between 9-10, patient is nonambulatory, denies progressive anemia symptoms at rest. Continue to monitor. 4. Pain - She is on chronic opioid medication and goes to methadone  clinic for her prescription. 5. In between visits, patient advised to call or come to ER in case of any new symptoms or acute sickness. She is agreeable to this plan.    Leia Alf, MD   02/17/2015 10:12 AM

## 2015-02-20 ENCOUNTER — Encounter: Payer: Self-pay | Admitting: Family Medicine

## 2015-02-20 ENCOUNTER — Ambulatory Visit (INDEPENDENT_AMBULATORY_CARE_PROVIDER_SITE_OTHER): Payer: Medicare Other | Admitting: Family Medicine

## 2015-02-20 VITALS — BP 118/71 | HR 98 | Temp 98.7°F | Resp 16 | Ht 64.0 in

## 2015-02-20 DIAGNOSIS — C7800 Secondary malignant neoplasm of unspecified lung: Secondary | ICD-10-CM | POA: Diagnosis not present

## 2015-02-20 DIAGNOSIS — I10 Essential (primary) hypertension: Secondary | ICD-10-CM | POA: Diagnosis not present

## 2015-02-20 DIAGNOSIS — I2510 Atherosclerotic heart disease of native coronary artery without angina pectoris: Secondary | ICD-10-CM

## 2015-02-20 DIAGNOSIS — C50911 Malignant neoplasm of unspecified site of right female breast: Secondary | ICD-10-CM | POA: Diagnosis not present

## 2015-02-20 DIAGNOSIS — F39 Unspecified mood [affective] disorder: Secondary | ICD-10-CM

## 2015-02-20 DIAGNOSIS — E039 Hypothyroidism, unspecified: Secondary | ICD-10-CM | POA: Diagnosis not present

## 2015-02-20 MED ORDER — BUSPIRONE HCL 10 MG PO TABS: 10.0000 mg | ORAL_TABLET | Freq: Three times a day (TID) | ORAL | Status: AC

## 2015-02-20 NOTE — Progress Notes (Signed)
Name: Betty Edwards   MRN: [id number]    DOB: 06-01-1945   Date:02/20/2015       Progress Note  Subjective  Chief Complaint  Chief Complaint  Patient presents with  . Hypertension    6 month follow up  . Breast Cancer    metaststic to lung    HPI Here for f/u of BP and hypothyroidism.  Sees Cancer center for follow of breast cancer metastasized to lung.  Still smoking.  Also with chronic pain , seen at Methadone clinic in [place].  States that her vision is changing and wants eye check.  Also c/o chronically stopped up L. nostril and itchy ears.   Wants to see Dr. Charolett Bumpers again.  Feels very anxious and nervous.  No problem-specific assessment & plan notes found for this encounter.   Past Medical History  Diagnosis Date  . Breast cancer metastasized to lung ([place]) 09/07/2014  . Carcinoma of breast metastatic to lung ([place]) 09/22/2014  . Hypertension     Social History  Substance Use Topics  . Smoking status: Current Every Day Smoker -- 1.00 packs/day    Types: Cigarettes  . Smokeless tobacco: Not on file     Comment: smoked for 46 years  . Alcohol Use: No     Current outpatient prescriptions:  .  albuterol (PROVENTIL HFA;VENTOLIN HFA) 108 (90 BASE) MCG/ACT inhaler, Inhale 2 puffs into the lungs every 6 (six) hours as needed for wheezing or shortness of breath., Disp: 1 Inhaler, Rfl: 2 .  aspirin EC 81 MG tablet, Take 81 mg by mouth., Disp: , Rfl:  .  atenolol (TENORMIN) 25 MG tablet, Take 0.5 tablets (12.5 mg total) by mouth daily., Disp: 45 tablet, Rfl: 3 .  busPIRone (BUSPAR) 5 MG tablet, Take 1 tablet (5 mg total) by mouth 3 (three) times daily., Disp: 90 tablet, Rfl: 3 .  diphenhydrAMINE (BENADRYL) 25 MG tablet, Take 1 tablet (25 mg total) by mouth every 8 (eight) hours as needed for itching., Disp: 90 tablet, Rfl: 0 .  DULoxetine (CYMBALTA) 30 MG capsule, Take 3 caps by mouth once daily., Disp: 90 capsule, Rfl: 6 .  exemestane (AROMASIN) 25 MG tablet, Take 1 tablet  (25 mg total) by mouth once., Disp: 30 tablet, Rfl: 1 .  Fluticasone-Salmeterol (ADVAIR) 250-50 MCG/DOSE AEPB, Inhale 1 puff into the lungs 2 (two) times daily as needed., Disp: , Rfl:  .  gabapentin (NEURONTIN) 300 MG capsule, Take 361m morning and  Mid day, and 600 mg at bedtime, Disp: 120 capsule, Rfl: 1 .  levalbuterol (XOPENEX HFA) 45 MCG/ACT inhaler, Inhale 2 puffs into the lungs every 4 (four) hours as needed for wheezing or shortness of breath., Disp: 1 Inhaler, Rfl: 12 .  levothyroxine (SYNTHROID, LEVOTHROID) 50 MCG tablet, Take 1 tablet (50 mcg total) by mouth daily before breakfast., Disp: 30 tablet, Rfl: 3 .  methadone (DOLOPHINE) 10 MG/ML solution, Take 117 mg by mouth daily., Disp: , Rfl:  .  ondansetron (ZOFRAN) 4 MG tablet, Take 1 tablet (4 mg total) by mouth every 8 (eight) hours as needed for nausea or vomiting., Disp: 60 tablet, Rfl: 2 .  palbociclib (IBRANCE) 125 MG capsule, Take 1 capsule (125 mg total) by mouth daily with breakfast for 3 weeks on and 1 week off cycles. Take whole with food., Disp: 21 capsule, Rfl: 4 .  pantoprazole (PROTONIX) 40 MG tablet, Take 1 tablet (40 mg total) by mouth daily., Disp: 90 tablet, Rfl: 3  Allergies  Allergen  Reactions  . Ibuprofen Other (See Comments)    GI Distress  . Prilosec [Omeprazole] Other (See Comments)    Jerky Movements  . Tramadol Nausea Only    Review of Systems  Constitutional: Positive for malaise/fatigue. Negative for fever and chills.  HENT: Negative for hearing loss.   Eyes: Negative for blurred vision and double vision.  Respiratory: Positive for shortness of breath and wheezing.   Cardiovascular: Negative for chest pain, palpitations and leg swelling.  Gastrointestinal: Positive for constipation. Negative for heartburn, abdominal pain and blood in stool.  Genitourinary: Negative for dysuria, urgency and frequency.  Musculoskeletal: Positive for myalgias and back pain.       Leg pain  Neurological: Negative for  dizziness, tremors and headaches.      Objective  Filed Vitals:   02/20/15 1425  BP: 118/71  Pulse: 98  Temp: 98.7 F (37.1 C)  TempSrc: Oral  Resp: 16  Height: _0  (1.626 m)     Physical Exam  Constitutional: She is oriented to person, place, and time. No distress.  Sitting in wheelchair.  Looks sl. Emaciated.  HENT:  Head: Normocephalic and atraumatic.  Neck: Normal range of motion. Neck supple. No thyromegaly present.  Cardiovascular: Normal rate, regular rhythm, normal heart sounds and intact distal pulses.  Exam reveals no gallop and no friction rub.   No murmur heard. Pulmonary/Chest: Effort normal and breath sounds normal. No respiratory distress. She has no wheezes. She has no rales.    Abdominal: There is no tenderness.  Musculoskeletal: She exhibits no edema.  Lymphadenopathy:    She has no cervical adenopathy.  Neurological: She is alert and oriented to person, place, and time.  Psychiatric:  Affect sl. Anxious.  Vitals reviewed.     Recent Results (from the past 2160 hour(s))  CBC with Differential/Platelet     Status: Abnormal   Collection Time: 12/03/14  1:23 PM  Result Value Ref Range   WBC 3.8 3.6 - 11.0 K/uL    Comment: A-LINE DRAW   RBC 3.44 (L) 3.80 - 5.20 MIL/uL   Hemoglobin 9.8 (L) 12.0 - 16.0 g/dL   HCT 32.2 (L) 35.0 - 47.0 %   MCV 93.6 80.0 - 100.0 fL   MCH 28.4 26.0 - 34.0 pg   MCHC 30.3 (L) 32.0 - 36.0 g/dL   RDW 17.6 (H) 11.5 - 14.5 %   Platelets 355 150 - 440 K/uL   Neutrophils Relative % 33 %   Neutro Abs 1.3 (L) 1.4 - 6.5 K/uL   Lymphocytes Relative 50 %   Lymphs Abs 1.9 1.0 - 3.6 K/uL   Monocytes Relative 12 %   Monocytes Absolute 0.5 0.2 - 0.9 K/uL   Eosinophils Relative 3 %   Eosinophils Absolute 0.1 0 - 0.7 K/uL   Basophils Relative 2 %   Basophils Absolute 0.1 0 - 0.1 K/uL  CA 27.29 (SERIAL MONITOR)     Status: Abnormal   Collection Time: 12/03/14  1:23 PM  Result Value Ref Range   CA 27.29 253.0 (H) 0.0 - 38.6  U/mL    Comment: (NOTE) [employer] [facility]/ACS methodology Performed At: [facility] [place], [place] [id number] Lindon Romp MD AL:[id number]   CBC with Differential     Status: Abnormal   Collection Time: 02/09/15  2:05 PM  Result Value Ref Range   WBC 4.0 3.6 - 11.0 K/uL   RBC 3.68 (L) 3.80 - 5.20 MIL/uL   Hemoglobin 10.1 (L) 12.0 -  16.0 g/dL   HCT 32.9 (L) 35.0 - 47.0 %   MCV 89.3 80.0 - 100.0 fL   MCH 27.5 26.0 - 34.0 pg   MCHC 30.8 (L) 32.0 - 36.0 g/dL   RDW 18.5 (H) 11.5 - 14.5 %   Platelets 242 150 - 440 K/uL   Neutrophils Relative % 43 %   Neutro Abs 1.7 1.4 - 6.5 K/uL   Lymphocytes Relative 45 %   Lymphs Abs 1.8 1.0 - 3.6 K/uL   Monocytes Relative 8 %   Monocytes Absolute 0.3 0.2 - 0.9 K/uL   Eosinophils Relative 2 %   Eosinophils Absolute 0.1 0 - 0.7 K/uL   Basophils Relative 2 %   Basophils Absolute 0.1 0 - 0.1 K/uL  Basic metabolic panel     Status: Abnormal   Collection Time: 02/09/15  2:05 PM  Result Value Ref Range   Sodium 136 135 - 145 mmol/L   Potassium 3.9 3.5 - 5.1 mmol/L   Chloride 102 101 - 111 mmol/L   CO2 29 22 - 32 mmol/L   Glucose, Bld 80 65 - 99 mg/dL   BUN 12 6 - 20 mg/dL   Creatinine, Ser 0.69 0.44 - 1.00 mg/dL   Calcium 8.1 (L) 8.9 - 10.3 mg/dL   GFR calc non Af Amer >60 >60 mL/min   GFR calc Af Amer >60 >60 mL/min    Comment: (NOTE) The eGFR has been calculated using the CKD EPI equation. This calculation has not been validated in all clinical situations. eGFR's persistently <60 mL/min signify possible Chronic Kidney Disease.    Anion gap 5 5 - 15  Hepatic function panel     Status: Abnormal   Collection Time: 02/09/15  2:05 PM  Result Value Ref Range   Total Protein 6.6 6.5 - 8.1 g/dL   Albumin 3.5 3.5 - 5.0 g/dL   AST 16 15 - 41 U/L   ALT 9 (L) 14 - 54 U/L   Alkaline Phosphatase 92 38 - 126 U/L   Total Bilirubin 0.4 0.3 - 1.2 mg/dL   Bilirubin, Direct 0.1 0.1 - 0.5 mg/dL   Indirect Bilirubin 0.3 0.3 -  0.9 mg/dL  Magnesium     Status: None   Collection Time: 02/09/15  2:05 PM  Result Value Ref Range   Magnesium 1.7 1.7 - 2.4 mg/dL  Phosphorus     Status: None   Collection Time: 02/09/15  2:05 PM  Result Value Ref Range   Phosphorus 4.3 2.5 - 4.6 mg/dL     Assessment & Plan  1. Essential hypertension   2. Arteriosclerosis of coronary artery   3. Breast cancer metastasized to lung, right ([place])   4. Hypothyroidism, unspecified hypothyroidism type  - TSH  5. Affective disorder ([place])  - busPIRone (BUSPAR) 10 MG tablet; Take 1 tablet (10 mg total) by mouth 3 (three) times daily.  Dispense: 90 tablet; Refill: 6

## 2015-02-20 NOTE — Patient Instructions (Signed)
Continue  To take current meds otherwise.  Cont. To follow Cancer Center recommendations.

## 2015-02-23 ENCOUNTER — Inpatient Hospital Stay: Payer: Medicare Other

## 2015-02-24 ENCOUNTER — Telehealth: Payer: Self-pay

## 2015-02-24 NOTE — Telephone Encounter (Signed)
Patient forgot to go over need for benadryl at last OV. She said she has jerking in legs on Gabapentin and she needs [place] to call [facility] and tell them it is safe for her to have benadryl delivered to her home and needs before 5:00. I explained the need to call 24-48hrs ahead for ALL medications as we are having clinic. Marland Kitchen

## 2015-02-24 NOTE — Telephone Encounter (Signed)
I have taken care of this.-jh

## 2015-02-26 ENCOUNTER — Telehealth: Payer: Self-pay

## 2015-02-26 ENCOUNTER — Telehealth: Payer: Self-pay | Admitting: *Deleted

## 2015-02-26 DIAGNOSIS — C7800 Secondary malignant neoplasm of unspecified lung: Principal | ICD-10-CM

## 2015-02-26 DIAGNOSIS — C50911 Malignant neoplasm of unspecified site of right female breast: Secondary | ICD-10-CM

## 2015-02-26 MED ORDER — LEVOFLOXACIN 500 MG PO TABS: 500.0000 mg | ORAL_TABLET | Freq: Every day | ORAL | Status: DC

## 2015-02-26 NOTE — Telephone Encounter (Signed)
Patient called to state that she had picked at a sore on her leg and developed a low grade temperature of 101.  She has been running the low grade temperature for 3-4 days.  Notified Dr. Rogue Bussing.  Called patient back to instruct her to stop taking Ibrance until she sees Dr. Tish Men on Monday 10/24.  Will draw labs at that appointment.  Also, patient to take Levaquin prescription.  Submitted this to pharmacy.

## 2015-03-02 ENCOUNTER — Inpatient Hospital Stay: Payer: Medicare Other

## 2015-03-02 ENCOUNTER — Inpatient Hospital Stay: Payer: Medicare Other | Admitting: Internal Medicine

## 2015-03-03 ENCOUNTER — Inpatient Hospital Stay
Admission: EM | Admit: 2015-03-03 | Discharge: 2015-03-04 | DRG: 603 | Disposition: A | Discharge status: Home Health | Payer: Medicare Other | Attending: Internal Medicine | Admitting: Internal Medicine

## 2015-03-03 ENCOUNTER — Inpatient Hospital Stay: Payer: Medicare Other

## 2015-03-03 DIAGNOSIS — J449 Chronic obstructive pulmonary disease, unspecified: Secondary | ICD-10-CM | POA: Diagnosis present

## 2015-03-03 DIAGNOSIS — Z880 Allergy status to penicillin: Secondary | ICD-10-CM

## 2015-03-03 DIAGNOSIS — C50919 Malignant neoplasm of unspecified site of unspecified female breast: Secondary | ICD-10-CM | POA: Diagnosis present

## 2015-03-03 DIAGNOSIS — F419 Anxiety disorder, unspecified: Secondary | ICD-10-CM | POA: Diagnosis present

## 2015-03-03 DIAGNOSIS — Z8614 Personal history of Methicillin resistant Staphylococcus aureus infection: Secondary | ICD-10-CM | POA: Diagnosis not present

## 2015-03-03 DIAGNOSIS — L03115 Cellulitis of right lower limb: Secondary | ICD-10-CM | POA: Diagnosis present

## 2015-03-03 DIAGNOSIS — F17210 Nicotine dependence, cigarettes, uncomplicated: Secondary | ICD-10-CM | POA: Diagnosis present

## 2015-03-03 DIAGNOSIS — Z955 Presence of coronary angioplasty implant and graft: Secondary | ICD-10-CM

## 2015-03-03 DIAGNOSIS — L0390 Cellulitis, unspecified: Secondary | ICD-10-CM | POA: Diagnosis present

## 2015-03-03 DIAGNOSIS — Z79899 Other long term (current) drug therapy: Secondary | ICD-10-CM

## 2015-03-03 DIAGNOSIS — R609 Edema, unspecified: Secondary | ICD-10-CM

## 2015-03-03 DIAGNOSIS — I10 Essential (primary) hypertension: Secondary | ICD-10-CM | POA: Diagnosis present

## 2015-03-03 DIAGNOSIS — C7800 Secondary malignant neoplasm of unspecified lung: Secondary | ICD-10-CM | POA: Diagnosis present

## 2015-03-03 DIAGNOSIS — F329 Major depressive disorder, single episode, unspecified: Secondary | ICD-10-CM | POA: Diagnosis present

## 2015-03-03 DIAGNOSIS — I2510 Atherosclerotic heart disease of native coronary artery without angina pectoris: Secondary | ICD-10-CM | POA: Diagnosis present

## 2015-03-03 DIAGNOSIS — L03119 Cellulitis of unspecified part of limb: Secondary | ICD-10-CM

## 2015-03-03 DIAGNOSIS — Z7982 Long term (current) use of aspirin: Secondary | ICD-10-CM | POA: Diagnosis not present

## 2015-03-03 DIAGNOSIS — G893 Neoplasm related pain (acute) (chronic): Secondary | ICD-10-CM | POA: Diagnosis present

## 2015-03-03 HISTORY — DX: Chronic obstructive pulmonary disease, unspecified: J44.9

## 2015-03-03 HISTORY — DX: Methicillin resistant Staphylococcus aureus infection, unspecified site: A49.02

## 2015-03-03 LAB — CBC WITH DIFFERENTIAL/PLATELET
BASOS ABS: 0 10*3/uL (ref 0–0.1)
BASOS PCT: 1 %
EOS PCT: 3 %
Eosinophils Absolute: 0.1 10*3/uL (ref 0–0.7)
HEMATOCRIT: 29.4 % — AB (ref 35.0–47.0)
Hemoglobin: 9.1 g/dL — ABNORMAL LOW (ref 12.0–16.0)
LYMPHS PCT: 43 %
Lymphs Abs: 1.3 10*3/uL (ref 1.0–3.6)
MCH: 28.2 pg (ref 26.0–34.0)
MCHC: 31 g/dL — AB (ref 32.0–36.0)
MCV: 90.9 fL (ref 80.0–100.0)
MONO ABS: 0.2 10*3/uL (ref 0.2–0.9)
Monocytes Relative: 7 %
Neutro Abs: 1.4 10*3/uL (ref 1.4–6.5)
Neutrophils Relative %: 46 %
PLATELETS: 132 10*3/uL — AB (ref 150–440)
RBC: 3.23 MIL/uL — ABNORMAL LOW (ref 3.80–5.20)
RDW: 18.7 % — AB (ref 11.5–14.5)
WBC: 3 10*3/uL — ABNORMAL LOW (ref 3.6–11.0)

## 2015-03-03 LAB — BASIC METABOLIC PANEL
ANION GAP: 5 (ref 5–15)
BUN: 12 mg/dL (ref 6–20)
CALCIUM: 8.7 mg/dL — AB (ref 8.9–10.3)
CO2: 30 mmol/L (ref 22–32)
CREATININE: 0.74 mg/dL (ref 0.44–1.00)
Chloride: 104 mmol/L (ref 101–111)
GFR calc Af Amer: >60 mL/min (ref >60)
GFR calc non Af Amer: >60 mL/min (ref >60)
GLUCOSE: 84 mg/dL (ref 65–99)
Potassium: 3.7 mmol/L (ref 3.5–5.1)
Sodium: 139 mmol/L (ref 135–145)

## 2015-03-03 MED ORDER — LEVOTHYROXINE SODIUM 50 MCG PO TABS
50.0000 ug | ORAL_TABLET | Freq: Every day | ORAL | Status: DC
  Administered 2015-03-04: 50 ug via ORAL
  Filled 2015-03-03: qty 1

## 2015-03-03 MED ORDER — SODIUM CHLORIDE 0.9 % IV SOLN
INTRAVENOUS | Status: DC
  Administered 2015-03-03: 17:00:00 via INTRAVENOUS

## 2015-03-03 MED ORDER — DULOXETINE HCL 30 MG PO CPEP
30.0000 mg | ORAL_CAPSULE | Freq: Every day | ORAL | Status: DC
  Administered 2015-03-04: 09:00:00 30 mg via ORAL
  Filled 2015-03-03 (×2): qty 1

## 2015-03-03 MED ORDER — GABAPENTIN 300 MG PO CAPS
800.0000 mg | ORAL_CAPSULE | Freq: Four times a day (QID) | ORAL | Status: DC
  Administered 2015-03-03 – 2015-03-04 (×3): 800 mg via ORAL
  Filled 2015-03-03 (×3): qty 2

## 2015-03-03 MED ORDER — PANTOPRAZOLE SODIUM 40 MG PO TBEC
40.0000 mg | DELAYED_RELEASE_TABLET | Freq: Every day | ORAL | Status: DC
  Administered 2015-03-03 – 2015-03-04 (×2): 40 mg via ORAL
  Filled 2015-03-03 (×2): qty 1

## 2015-03-03 MED ORDER — NICOTINE POLACRILEX 2 MG MT GUM
2.0000 mg | CHEWING_GUM | OROMUCOSAL | Status: DC | PRN
  Administered 2015-03-04 (×2): 2 mg via ORAL
  Filled 2015-03-03 (×3): qty 1

## 2015-03-03 MED ORDER — SENNOSIDES-DOCUSATE SODIUM 8.6-50 MG PO TABS: 1.0000 | ORAL_TABLET | Freq: Every evening | ORAL | Status: DC | PRN

## 2015-03-03 MED ORDER — ALBUTEROL SULFATE (2.5 MG/3ML) 0.083% IN NEBU: 2.5000 mg | INHALATION_SOLUTION | Freq: Four times a day (QID) | RESPIRATORY_TRACT | Status: DC | PRN

## 2015-03-03 MED ORDER — GABAPENTIN 800 MG PO TABS
800.0000 mg | ORAL_TABLET | Freq: Four times a day (QID) | ORAL | Status: DC
  Filled 2015-03-03 (×2): qty 1

## 2015-03-03 MED ORDER — VANCOMYCIN HCL IN DEXTROSE 1-5 GM/200ML-% IV SOLN
1000.0000 mg | Freq: Once | INTRAVENOUS | Status: AC
Start: 2015-03-03 — End: 2015-03-03
  Administered 2015-03-03: 1000 mg via INTRAVENOUS
  Filled 2015-03-03: qty 200

## 2015-03-03 MED ORDER — ACETAMINOPHEN 650 MG RE SUPP: 650.0000 mg | Freq: Four times a day (QID) | RECTAL | Status: DC | PRN

## 2015-03-03 MED ORDER — LEVALBUTEROL TARTRATE 45 MCG/ACT IN AERO
2.0000 | INHALATION_SPRAY | RESPIRATORY_TRACT | Status: DC | PRN
  Filled 2015-03-03: qty 15

## 2015-03-03 MED ORDER — BUSPIRONE HCL 5 MG PO TABS
10.0000 mg | ORAL_TABLET | Freq: Three times a day (TID) | ORAL | Status: DC
  Administered 2015-03-03 – 2015-03-04 (×3): 10 mg via ORAL
  Filled 2015-03-03 (×4): qty 2

## 2015-03-03 MED ORDER — ALUM & MAG HYDROXIDE-SIMETH 200-200-20 MG/5ML PO SUSP: 30.0000 mL | Freq: Four times a day (QID) | ORAL | Status: DC | PRN

## 2015-03-03 MED ORDER — LORAZEPAM 2 MG/ML IJ SOLN
2.0000 mg | Freq: Once | INTRAMUSCULAR | Status: AC
  Administered 2015-03-03: 17:00:00 2 mg via INTRAVENOUS
  Filled 2015-03-03: qty 1

## 2015-03-03 MED ORDER — MOMETASONE FURO-FORMOTEROL FUM 100-5 MCG/ACT IN AERO
2.0000 | INHALATION_SPRAY | Freq: Two times a day (BID) | RESPIRATORY_TRACT | Status: DC
  Administered 2015-03-03 – 2015-03-04 (×2): 2 via RESPIRATORY_TRACT
  Filled 2015-03-03: qty 8.8

## 2015-03-03 MED ORDER — ACETAMINOPHEN 325 MG PO TABS
650.0000 mg | ORAL_TABLET | Freq: Four times a day (QID) | ORAL | Status: DC | PRN
  Administered 2015-03-03 – 2015-03-04 (×3): 650 mg via ORAL
  Filled 2015-03-03 (×3): qty 2

## 2015-03-03 MED ORDER — DIPHENHYDRAMINE HCL 25 MG PO TABS
25.0000 mg | ORAL_TABLET | Freq: Three times a day (TID) | ORAL | Status: DC | PRN
  Administered 2015-03-03 – 2015-03-04 (×2): 25 mg via ORAL
  Filled 2015-03-03 (×4): qty 1

## 2015-03-03 MED ORDER — NICOTINE 10 MG IN INHA: 1.0000 | RESPIRATORY_TRACT | Status: DC | PRN

## 2015-03-03 MED ORDER — METHADONE HCL 10 MG/ML PO CONC
117.0000 mg | Freq: Every day | ORAL | Status: DC
  Administered 2015-03-04: 117 mg via ORAL
  Filled 2015-03-03 (×2): qty 11.7

## 2015-03-03 MED ORDER — ASPIRIN EC 81 MG PO TBEC
81.0000 mg | DELAYED_RELEASE_TABLET | Freq: Every day | ORAL | Status: DC
  Administered 2015-03-03 – 2015-03-04 (×2): 81 mg via ORAL
  Filled 2015-03-03 (×2): qty 1

## 2015-03-03 MED ORDER — ONDANSETRON HCL 4 MG PO TABS: 4.0000 mg | ORAL_TABLET | Freq: Three times a day (TID) | ORAL | Status: DC | PRN

## 2015-03-03 MED ORDER — NICOTINE 21 MG/24HR TD PT24
21.0000 mg | MEDICATED_PATCH | Freq: Every day | TRANSDERMAL | Status: DC
  Administered 2015-03-04: 21 mg via TRANSDERMAL
  Filled 2015-03-03: qty 1

## 2015-03-03 MED ORDER — DULOXETINE HCL 60 MG PO CPEP: 90.0000 mg | ORAL_CAPSULE | Freq: Every day | ORAL | Status: DC

## 2015-03-03 MED ORDER — SODIUM CHLORIDE 0.9 % IV BOLUS (SEPSIS)
500.0000 mL | Freq: Once | INTRAVENOUS | Status: AC
  Administered 2015-03-03: 500 mL via INTRAVENOUS

## 2015-03-03 MED ORDER — ATENOLOL 12.5 MG HALF TABLET
12.5000 mg | ORAL_TABLET | Freq: Every day | ORAL | Status: DC
  Administered 2015-03-04: 12.5 mg via ORAL
  Filled 2015-03-03: qty 1

## 2015-03-03 MED ORDER — ENOXAPARIN SODIUM 40 MG/0.4ML ~~LOC~~ SOLN
40.0000 mg | SUBCUTANEOUS | Status: DC
  Administered 2015-03-03: 40 mg via SUBCUTANEOUS
  Filled 2015-03-03: qty 0.4

## 2015-03-03 MED ORDER — GABAPENTIN 300 MG PO CAPS: 300.0000 mg | ORAL_CAPSULE | Freq: Every day | ORAL | Status: DC

## 2015-03-03 MED ORDER — ASPIRIN EC 81 MG PO TBEC: 81.0000 mg | DELAYED_RELEASE_TABLET | Freq: Every day | ORAL | Status: DC

## 2015-03-03 MED ORDER — SODIUM CHLORIDE 0.9 % IV BOLUS (SEPSIS): 500.0000 mL | Freq: Once | INTRAVENOUS | Status: DC

## 2015-03-03 MED ORDER — VANCOMYCIN HCL IN DEXTROSE 1-5 GM/200ML-% IV SOLN
1000.0000 mg | INTRAVENOUS | Status: DC
  Administered 2015-03-04: 04:00:00 1000 mg via INTRAVENOUS
  Filled 2015-03-03: qty 200

## 2015-03-03 MED ORDER — ALBUTEROL SULFATE (2.5 MG/3ML) 0.083% IN NEBU: 2.5000 mg | INHALATION_SOLUTION | RESPIRATORY_TRACT | Status: DC | PRN

## 2015-03-03 MED ORDER — ALBUTEROL SULFATE HFA 108 (90 BASE) MCG/ACT IN AERS: 2.0000 | INHALATION_SPRAY | Freq: Four times a day (QID) | RESPIRATORY_TRACT | Status: DC | PRN

## 2015-03-03 MED ORDER — VANCOMYCIN HCL IN DEXTROSE 1-5 GM/200ML-% IV SOLN
1000.0000 mg | INTRAVENOUS | Status: DC
  Filled 2015-03-03: qty 200

## 2015-03-03 NOTE — ED Notes (Signed)
Pt presents to ED via EMS with leg infection present after round of antibiotics. Pt states infection is better, but still present. Leg is red, slightly swollen, warm to touch.

## 2015-03-03 NOTE — ED Notes (Signed)
Pt states she took the last dose of her 7 day antibiotic this morning. States she also took tylenol.

## 2015-03-03 NOTE — Plan of Care (Signed)
Problem: Discharge Progression Outcomes Goal: Other Discharge Outcomes/Goals Outcome: Progressing Patient admitted with cellulitis. IV fluids infusing. Tolerating diet well. VSS.

## 2015-03-03 NOTE — Progress Notes (Signed)
ANTIBIOTIC CONSULT NOTE - INITIAL  Pharmacy Consult for vancomycin Indication: Cellulitis  Allergies  Allergen Reactions  . Penicillins Shortness Of Breath, Itching and Other (See Comments)    Has patient had a PCN reaction causing immediate rash, facial/tongue/throat swelling, SOB or lightheadedness with hypotension: Yes Has patient had a PCN reaction causing severe rash involving mucus membranes or skin necrosis: No Has patient had a PCN reaction that required hospitalization No Has patient had a PCN reaction occurring within the last 10 years: Yes If all of the above answers are "NO", then may proceed with Cephalosporin use.  . Ibuprofen Other (See Comments)    Reaction:  GI upset   . Prilosec [Omeprazole] Other (See Comments)    Reaction:  Involuntary muscle movements   . Tramadol Nausea Only  . Sulfa Antibiotics Itching and Rash    Patient Measurements: Height: 5\' 4"  (162.6 cm) Weight: 115 lb (52.164 kg) IBW/kg (Calculated) : 54.7  Vital Signs: Temp: 98.9 F (37.2 C) (10/25 1559) Temp Source: Oral (10/25 1559) BP: 152/86 mmHg (10/25 1559) Pulse Rate: 88 (10/25 1559) Intake/Output from previous day:   Intake/Output from this shift:    Labs:  Recent Labs  03/03/15 1124  WBC 3.0*  HGB 9.1*  PLT 132*  CREATININE 0.74   Estimated Creatinine Clearance: 56.2 mL/min (by C-G formula based on Cr of 0.74). No results for input(s): VANCOTROUGH, VANCOPEAK, VANCORANDOM, GENTTROUGH, GENTPEAK, GENTRANDOM, TOBRATROUGH, TOBRAPEAK, TOBRARND, AMIKACINPEAK, AMIKACINTROU, AMIKACIN in the last 72 hours.   Microbiology: No results found for this or any previous visit (from the past 720 hour(s)).  Medical History: Past Medical History  Diagnosis Date  . Breast cancer metastasized to lung ([place]) 09/07/2014  . Carcinoma of breast metastatic to lung ([place]) 09/22/2014  . Hypertension   . MRSA (methicillin resistant Staphylococcus aureus)   . COPD (chronic obstructive pulmonary disease)  (HCC)     Medications:  Anti-infectives    Start     Dose/Rate Route Frequency Ordered Stop   03/04/15 0400  vancomycin (VANCOCIN) IVPB 1000 mg/200 mL premix     1,000 mg 200 mL/hr over 60 Minutes Intravenous Every 24 hours 03/03/15 1604     03/04/15 0000  vancomycin (VANCOCIN) IVPB 1000 mg/200 mL premix  Status:  Discontinued     1,000 mg 200 mL/hr over 60 Minutes Intravenous Every 24 hours 03/03/15 1315 03/03/15 1603   03/03/15 1145  vancomycin (VANCOCIN) IVPB 1000 mg/200 mL premix     1,000 mg 200 mL/hr over 60 Minutes Intravenous  Once 03/03/15 1132 03/03/15 1343     Assessment: Pharmacy consulted to monitor and dose vancomycin in this 69 year old female diagnosed with cellulitis. Patient has failed outpatient levofloxacin prior to admission, currently on vancomycin day #1.  Patient received a 1g dose of vancomycin in the ED  Kinetics: Use actual body weight (actual body weight is less than IBW) Ke= 0.055 Half-life: ~13 hours VD=36L Cmin (calculated) ~12  Goal of Therapy:  Vancomycin trough level 10-15 mcg/ml  Plan:  Measure antibiotic drug levels at steady state Follow up culture results   Vancomycin 1g dose given in ED Start vancomycin 1g IV q24h (stacked dose to begin 15 hours after initial dose) Vancomycin trough ordered for 10/28 prior to 4th dose  Pharmacy will continue to monitor renal function and vancomycin levels and adjust dose as needed. Thank you for the consult.  Darylene Price Stephana Morell 03/03/2015,4:08 PM

## 2015-03-03 NOTE — ED Provider Notes (Addendum)
[facility] Emergency Department Provider Note  ____________________________________________  Time seen: Approximately 11:31 AM  I have reviewed the triage vital signs and the nursing notes.   HISTORY  Chief Complaint Leg Pain    HPI Betty Edwards is a 69 y.o. female with history of metastatic breast cancer on chemotherapy, hypertension, COPD who presents for evaluation of worsening cellulitis of the right lower extremity. Patient reports that over a week ago she picked a scab on her leg and developed some redness associated with the right lower extremity. This happened gradually and has been worsening, has been constant since onset. She was prescribed Levaquin and took a 7 day course however has not had improvement of her symptoms and she is had subjective fever today. No vomiting, diarrhea, chest pain or difficulty breathing. No modifying factors. Currently her symptoms are moderate to severe.   Past Medical History  Diagnosis Date  . Breast cancer metastasized to lung ([place]) 09/07/2014  . Carcinoma of breast metastatic to lung ([place]) 09/22/2014  . Hypertension   . MRSA (methicillin resistant Staphylococcus aureus)   . COPD (chronic obstructive pulmonary disease) [facility])     Patient Active Problem List   Diagnosis Date Noted  . Carcinoma of breast metastatic to lung ([place]) 09/22/2014  . Fever 09/08/2014  . Fever presenting with conditions classified elsewhere 09/07/2014  . SIRS (systemic inflammatory response syndrome) ([place]) 09/07/2014  . Breast cancer metastasized to lung ([place]) 09/07/2014  . Hypertension 09/06/2014  . Arteriosclerosis of coronary artery 05/28/2013  . Chest pain 05/28/2013  . CAFL (chronic airflow limitation) ([place]) 05/28/2013  . Adult hypothyroidism 05/28/2013  . Affective disorder ([place]) 05/28/2013  . Drug abuse, opioid type 05/28/2013    Past Surgical History  Procedure Laterality Date  . Mastectomy      Current Outpatient Rx  Name   Route  Sig  Dispense  Refill  . albuterol (PROVENTIL HFA;VENTOLIN HFA) 108 (90 BASE) MCG/ACT inhaler   Inhalation   Inhale 2 puffs into the lungs every 6 (six) hours as needed for wheezing or shortness of breath.   1 Inhaler   2   . aspirin EC 81 MG tablet   Oral   Take 81 mg by mouth.         Marland Kitchen atenolol (TENORMIN) 25 MG tablet   Oral   Take 0.5 tablets (12.5 mg total) by mouth daily.   45 tablet   3   . busPIRone (BUSPAR) 10 MG tablet   Oral   Take 1 tablet (10 mg total) by mouth 3 (three) times daily.   90 tablet   6   . diphenhydrAMINE (BENADRYL) 25 MG tablet   Oral   Take 1 tablet (25 mg total) by mouth every 8 (eight) hours as needed for itching.   90 tablet   0   . DULoxetine (CYMBALTA) 30 MG capsule      Take 3 caps by mouth once daily.   90 capsule   6   . exemestane (AROMASIN) 25 MG tablet   Oral   Take 1 tablet (25 mg total) by mouth once.   30 tablet   1   . Fluticasone-Salmeterol (ADVAIR) 250-50 MCG/DOSE AEPB   Inhalation   Inhale 1 puff into the lungs 2 (two) times daily as needed.         . gabapentin (NEURONTIN) 300 MG capsule      Take 300mg  morning and  Mid day, and 600 mg at bedtime  120 capsule   1   . levalbuterol (XOPENEX HFA) 45 MCG/ACT inhaler   Inhalation   Inhale 2 puffs into the lungs every 4 (four) hours as needed for wheezing or shortness of breath.   1 Inhaler   12   . levofloxacin (LEVAQUIN) 500 MG tablet   Oral   Take 1 tablet (500 mg total) by mouth daily.   7 tablet   0   . levothyroxine (SYNTHROID, LEVOTHROID) 50 MCG tablet   Oral   Take 1 tablet (50 mcg total) by mouth daily before breakfast.   30 tablet   3   . methadone (DOLOPHINE) 10 MG/ML solution   Oral   Take 117 mg by mouth daily.         . ondansetron (ZOFRAN) 4 MG tablet   Oral   Take 1 tablet (4 mg total) by mouth every 8 (eight) hours as needed for nausea or vomiting.   60 tablet   2   . palbociclib (IBRANCE) 125 MG capsule      Take  1 capsule (125 mg total) by mouth daily with breakfast for 3 weeks on and 1 week off cycles. Take whole with food.   21 capsule   4   . pantoprazole (PROTONIX) 40 MG tablet   Oral   Take 1 tablet (40 mg total) by mouth daily.   90 tablet   3     Allergies Ibuprofen; Penicillins; Prilosec; Sulfa antibiotics; and Tramadol  No family history on file.  Social History Social History  Substance Use Topics  . Smoking status: Current Every Day Smoker -- 1.00 packs/day    Types: Cigarettes  . Smokeless tobacco: None     Comment: smoked for 46 years  . Alcohol Use: No    Review of Systems Constitutional: No fever/chills Eyes: No visual changes. ENT: No sore throat. Cardiovascular: Denies chest pain. Respiratory: Denies shortness of breath. Gastrointestinal: No abdominal pain.  No nausea, no vomiting.  No diarrhea.  No constipation. Genitourinary: Negative for dysuria. Musculoskeletal: Negative for back pain. Skin: Negative for rash. Neurological: Negative for headaches, focal weakness or numbness.  10-point ROS otherwise negative.  ____________________________________________   PHYSICAL EXAM:  VITAL SIGNS: ED Triage Vitals  Enc Vitals Group     BP 03/03/15 1059 116/62 mmHg     Pulse Rate 03/03/15 1059 102     Resp 03/03/15 1059 16     Temp 03/03/15 1059 98.8 F (37.1 C)     Temp src --      SpO2 03/03/15 1059 93 %     Weight 03/03/15 1104 115 lb (52.164 kg)     Height 03/03/15 1104 5\' 4"  (1.626 m)     Head Cir --      Peak Flow --      Pain Score 03/03/15 1053 7     Pain Loc --      Pain Edu? --      Excl. in [place]? --     Constitutional: Alert and oriented. Chronically ill-appearing but in no acute distress. Eyes: Conjunctivae are normal. PERRL. EOMI. Head: Atraumatic. Nose: No congestion/rhinnorhea. Mouth/Throat: Mucous membranes are moist.  Oropharynx non-erythematous. Neck: No stridor.   Cardiovascular: mildly tachycardic rate, regular rhythm. Grossly  normal heart sounds.  Good peripheral circulation. Respiratory: Normal respiratory effort.  No retractions. Lungs CTAB. Gastrointestinal: Soft and nontender. No distention. No abdominal bruits. No CVA tenderness. Genitourinary: deferred Musculoskeletal: Is a large area of warmth and erythema associated with  the right lower leg just proximal to the ankle as well as several excoriated lesions with scabs. There is chronic deformity at the right knee. Neurologic:  Normal speech and language. No gross focal neurologic deficits are appreciated.  Skin:  Skin is warm, dry and intact. No rash noted. Psychiatric: Mood and affect are normal. Speech and behavior are normal.  ____________________________________________   LABS (all labs ordered are listed, but only abnormal results are displayed)  Labs Reviewed  CBC WITH DIFFERENTIAL/PLATELET - Abnormal; Notable for the following:    WBC 3.0 (*)    RBC 3.23 (*)    Hemoglobin 9.1 (*)    HCT 29.4 (*)    MCHC 31.0 (*)    RDW 18.7 (*)    Platelets 132 (*)    All other components within normal limits  BASIC METABOLIC PANEL - Abnormal; Notable for the following:    Calcium 8.7 (*)    All other components within normal limits  CULTURE, BLOOD (ROUTINE X 2)  CULTURE, BLOOD (ROUTINE X 2)   ____________________________________________  EKG  none ____________________________________________  RADIOLOGY  none ____________________________________________   PROCEDURES  Procedure(s) performed: None  Critical Care performed: Yes. Total critical care time spent 30 minutes  ____________________________________________   INITIAL IMPRESSION / ASSESSMENT AND PLAN / ED COURSE  Pertinent labs & imaging results that were available during my care of the patient were reviewed by me and considered in my medical decision making (see chart for details).  Betty Edwards is a 69 y.o. female with history of metastatic breast cancer on chemotherapy,  diastolic CHF, hypertension, COPD who presents for evaluation of worsening cellulitis of the right lower extremity. Patient reports that over a week ago she picked a scab on her leg and developed some redness at the site. At this time she is mildly tachycardic, mildly tachypneic and leukopenic and I suspect sepsis secondary to worsening cellulitis. We'll give IV fluids, IV vancomycin and admit. Will not give full 30 mL/kg bolus of IV fluids given her history of CHF and the fact that she is needing 2 L of oxygen here already which is an increase from baseline/she has no home oxygen requirement, I am concerned about causing flash pulmonary edema. BMP was unremarkable. CBC was also notable for anemia with hemoglobin 9.1 and appears to be her baseline.Hospitalist will admit. ____________________________________________   FINAL CLINICAL IMPRESSION(S) / ED DIAGNOSES  Final diagnoses:  Cellulitis of leg without foot   Final critical impression should include sepsis secondary to cellulitis. Total critical care time spent 30 minutes.   Joanne Gavel, MD 03/03/15 [id number]  Joanne Gavel, MD 03/03/15 [phone]

## 2015-03-03 NOTE — H&P (Cosign Needed)
[facility] [place] NAME: Betty Edwards    MR#:  [id number]  DATE OF BIRTH:  [birth year]  DATE OF ADMISSION:  03/03/2015  PRIMARY CARE PHYSICIAN: [name], MD   REQUESTING/REFERRING PHYSICIAN: Jackelyn Knife  CHIEF COMPLAINT:   Chief Complaint  Patient presents with  . Leg Pain    HISTORY OF PRESENT ILLNESS:  Betty Edwards  is a 69 y.o. female with a known history of  Breast Ca,COPD,Lung CA ,comes in with   Right leg cellulitis despite oral levaquin.. Patient finished 7 days of Levaquin, but persistently has right leg redness, pain, low-grade temperature. Signing this patient is seen in the emergency room. She had history of MRSA infection. Admitting her for right leg cellulitis failed outpatient therapy. And complains of some nausea and headache, no other complaints. No chills. History of metastatic breast cancer, terminally ill getting oral  chemotherapy.  PAST MEDICAL HISTORY:   Past Medical History  Diagnosis Date  . Breast cancer metastasized to lung ([place]) 09/07/2014  . Carcinoma of breast metastatic to lung ([place]) 09/22/2014  . Hypertension   . MRSA (methicillin resistant Staphylococcus aureus)   . COPD (chronic obstructive pulmonary disease) ([place])     PAST SURGICAL HISTOIRY:   Past Surgical History  Procedure Laterality Date  . Mastectomy      SOCIAL HISTORY:   Social History  Substance Use Topics  . Smoking status: Current Every Day Smoker -- 1.00 packs/day    Types: Cigarettes  . Smokeless tobacco: Not on file     Comment: smoked for 46 years  . Alcohol Use: No    FAMILY HISTORY:  No family history on file.  DRUG ALLERGIES:   Allergies  Allergen Reactions  . Ibuprofen Other (See Comments)    GI Distress  . Penicillins   . Prilosec [Omeprazole] Other (See Comments)    Jerky Movements  . Sulfa Antibiotics   . Tramadol Nausea Only    REVIEW OF SYSTEMS:  CONSTITUTIONAL: No fever, fatigue or  weakness.  EYES: No blurred or double vision.  EARS, NOSE, AND THROAT: No tinnitus or ear pain.  RESPIRATORY: No cough, shortness of breath, wheezing or hemoptysis.  CARDIOVASCULAR: No chest pain, orthopnea, edema.  GASTROINTESTINAL: No nausea, vomiting, diarrhea or abdominal pain.  GENITOURINARY: No dysuria, hematuria.  ENDOCRINE: No polyuria, nocturia,  HEMATOLOGY: No anemia, easy bruising or bleeding SKIN: No rash or lesion. MUSCULOSKELETAL: right leg cellulitis. NEUROLOGIC: No tingling, numbness, weakness.  PSYCHIATRY: No anxiety or depression.   MEDICATIONS AT HOME:   Prior to Admission medications   Medication Sig Start Date End Date Taking? Authorizing Provider  albuterol (PROVENTIL HFA;VENTOLIN HFA) 108 (90 BASE) MCG/ACT inhaler Inhale 2 puffs into the lungs every 6 (six) hours as needed for wheezing or shortness of breath. 02/06/15   Leia Alf, MD  aspirin EC 81 MG tablet Take 81 mg by mouth.    Historical Provider, MD  atenolol (TENORMIN) 25 MG tablet Take 0.5 tablets (12.5 mg total) by mouth daily. 12/12/14   Arlis Porta., MD  busPIRone (BUSPAR) 10 MG tablet Take 1 tablet (10 mg total) by mouth 3 (three) times daily. 02/20/15   Arlis Porta., MD  diphenhydrAMINE (BENADRYL) 25 MG tablet Take 1 tablet (25 mg total) by mouth every 8 (eight) hours as needed for itching. 02/09/15   Leia Alf, MD  DULoxetine (CYMBALTA) 30 MG capsule Take 3 caps by mouth once daily. 11/18/14  Arlis Porta., MD  exemestane (AROMASIN) 25 MG tablet Take 1 tablet (25 mg total) by mouth once. 01/14/15   Leia Alf, MD  Fluticasone-Salmeterol (ADVAIR) 250-50 MCG/DOSE AEPB Inhale 1 puff into the lungs 2 (two) times daily as needed.    Historical Provider, MD  gabapentin (NEURONTIN) 300 MG capsule Take 300mg  morning and  Mid day, and 600 mg at bedtime 11/14/14   Leia Alf, MD  levalbuterol [facility] HFA) 45 MCG/ACT inhaler Inhale 2 puffs into the lungs every 4 (four) hours as  needed for wheezing or shortness of breath. 01/28/15   Arlis Porta., MD  levofloxacin (LEVAQUIN) 500 MG tablet Take 1 tablet (500 mg total) by mouth daily. 02/26/15   Cammie Sickle, MD  levothyroxine (SYNTHROID, LEVOTHROID) 50 MCG tablet Take 1 tablet (50 mcg total) by mouth daily before breakfast. 02/16/15   Arlis Porta., MD  methadone (DOLOPHINE) 10 MG/ML solution Take 117 mg by mouth daily.    Historical Provider, MD  ondansetron (ZOFRAN) 4 MG tablet Take 1 tablet (4 mg total) by mouth every 8 (eight) hours as needed for nausea or vomiting. 01/23/15   Leia Alf, MD  palbociclib (IBRANCE) 125 MG capsule Take 1 capsule (125 mg total) by mouth daily with breakfast for 3 weeks on and 1 week off cycles. Take whole with food. 02/15/15   Leia Alf, MD  pantoprazole (PROTONIX) 40 MG tablet Take 1 tablet (40 mg total) by mouth daily. 12/12/14   Arlis Porta., MD      VITAL SIGNS:  Blood pressure 102/61, pulse 96, temperature 98.8 F (37.1 C), resp. rate 22, height 5\' 4"  (1.626 m), weight 52.164 kg (115 lb), SpO2 95 %.  PHYSICAL EXAMINATION:  GENERAL:  69 y.o.-year-old patient lying in the bed with no acute distress.  EYES: Pupils equal, round, reactive to light and accommodation. No scleral icterus. Extraocular muscles intact.  HEENT: Head atraumatic, normocephalic. Oropharynx and nasopharynx clear.  NECK:  Supple, no jugular venous distention. No thyroid enlargement, no tenderness.  LUNGS: Normal breath sounds bilaterally, no wheezing, rales,rhonchi or crepitation. No use of accessory muscles of respiration.  CARDIOVASCULAR: S1, S2 normal. No murmurs, rubs, or gallops.  ABDOMEN: Soft, nontender, nondistended. Bowel sounds present. No organomegaly or mass.  EXTREMITIES: No pedal edema, cyanosis, or clubbing.  NEUROLOGIC: Cranial nerves II through XII are intact. Muscle strength 5/5 in all extremities. Sensation intact. Gait not checked.  PSYCHIATRIC: The patient is  alert and oriented x 3.  SKIN: No obvious rash, lesion, or ulcer.   LABORATORY PANEL:   CBC  Recent Labs Lab 03/03/15 1124  WBC 3.0*  HGB 9.1*  HCT 29.4*  PLT 132*   ------------------------------------------------------------------------------------------------------------------  Chemistries   Recent Labs Lab 03/03/15 1124  NA 139  K 3.7  CL 104  CO2 30  GLUCOSE 84  BUN 12  CREATININE 0.74  CALCIUM 8.7*   ------------------------------------------------------------------------------------------------------------------  Cardiac Enzymes No results for input(s): TROPONINI in the last 168 hours. ------------------------------------------------------------------------------------------------------------------  RADIOLOGY:  No results found.  EKG:   Orders placed or performed in visit on 09/06/14  . EKG 12-Lead    IMPRESSION AND PLAN:    1.Right leg cellulitis,failed out pt therapy;on IV Vanco, watch closely. Check ultrasound of the leg for DVT studies. #2 metastatic breast cancer, metastases to the lungs: See   Dr.Sandeep Pandit: Takes Oral Chemotherapy with  Aromasin.Ibrance, TERMINALLY ILL #3 history of coronary artery disease and stents placed: Patient is stable at  this time.continue aspirin, atenolol.  #4 history of COPD, continues to smoke does not want to quit:. Continue Proventil. Advair., Order nicotine patch  #5 anxiety, depression continue BuSpar, Cymbalta #6 chronic pain secondary to cancer: Continue methadone DVT prophylaxis, GI prophylaxis.  All the records are reviewed and case discussed with ED provider. Management plans discussed with the patient, family and they are in agreement.  CODE STATUS: Full   TOTAL TIME TAKING CARE OF THIS PATIENT: 55 minutes.    Epifanio Lesches M.D on 03/03/2015 at 1:16 PM  Between 7am to 6pm - Pager - [phone]  After 6pm go to [email or web address] - password [email or web address] [facility]  Office   [phone]  CC: Primary care physician; [name], MD  Note: This dictation was prepared with Dragon dictation along with smaller phrase technology. Any transcriptional errors that result from this process are unintentional.

## 2015-03-04 LAB — BASIC METABOLIC PANEL
Anion gap: 5 (ref 5–15)
BUN: 9 mg/dL (ref 6–20)
CO2: 29 mmol/L (ref 22–32)
CREATININE: 0.59 mg/dL (ref 0.44–1.00)
Calcium: 8.5 mg/dL — ABNORMAL LOW (ref 8.9–10.3)
Chloride: 105 mmol/L (ref 101–111)
GFR calc Af Amer: >60 mL/min (ref >60)
GFR calc non Af Amer: >60 mL/min (ref >60)
GLUCOSE: 109 mg/dL — AB (ref 65–99)
POTASSIUM: 3.5 mmol/L (ref 3.5–5.1)
Sodium: 139 mmol/L (ref 135–145)

## 2015-03-04 LAB — CBC
HEMATOCRIT: 30.5 % — AB (ref 35.0–47.0)
Hemoglobin: 9.6 g/dL — ABNORMAL LOW (ref 12.0–16.0)
MCH: 28.7 pg (ref 26.0–34.0)
MCHC: 31.6 g/dL — ABNORMAL LOW (ref 32.0–36.0)
MCV: 91 fL (ref 80.0–100.0)
PLATELETS: 132 10*3/uL — AB (ref 150–440)
RBC: 3.35 MIL/uL — ABNORMAL LOW (ref 3.80–5.20)
RDW: 18.7 % — AB (ref 11.5–14.5)
WBC: 3.8 10*3/uL (ref 3.6–11.0)

## 2015-03-04 MED ORDER — CLINDAMYCIN HCL 300 MG PO CAPS: 300.0000 mg | ORAL_CAPSULE | Freq: Three times a day (TID) | ORAL | Status: DC

## 2015-03-04 MED ORDER — HEPARIN SOD (PORK) LOCK FLUSH 100 UNIT/ML IV SOLN
500.0000 [IU] | INTRAVENOUS | Status: AC | PRN
  Administered 2015-03-04: 12:00:00 500 [IU]
  Filled 2015-03-04: qty 5

## 2015-03-04 NOTE — Discharge Summary (Signed)
[place] [facility] [place]   PATIENT NAME: Betty Edwards    MR#:  [id number]  DATE OF BIRTH:  1946/01/19  DATE OF ADMISSION:  03/03/2015 ADMITTING PHYSICIAN: Epifanio Lesches, MD  DATE OF DISCHARGE: 03/04/2015  PRIMARY CARE PHYSICIAN: [name], MD    ADMISSION DIAGNOSIS:  Swelling [R60.9] Cellulitis of leg without foot [B93.903]  DISCHARGE DIAGNOSIS:  Active Problems:   Cellulitis   SECONDARY DIAGNOSIS:   Past Medical History  Diagnosis Date  . Breast cancer metastasized to lung ([place]) 09/07/2014  . Carcinoma of breast metastatic to lung ([place]) 09/22/2014  . Hypertension   . MRSA (methicillin resistant Staphylococcus aureus)   . COPD (chronic obstructive pulmonary disease) [facility])     HOSPITAL COURSE:   #1 right lower extremity cellulitis: Failed outpatient therapy with Levaquin. Blood cultures are pending. She has responded very well to 24 hours of IV vancomycin. Ultrasound for DVT is negative. At this point swelling, erythema and pain are much improved. No significant fever overnight. She is allergic to penicillins and sulfa drugs. We'll discharge and clindamycin 300 mg 3 times a day for 7 additional days. She has chemotherapy scheduled for 2 days from today and the leg will be reevaluated at that time. We will also write for home health nursing for wound care and close monitoring.  #2 metastatic breast cancer: Continues to follow with oncology for chemotherapy. Next chemotherapy is on Friday 2 days from now.  #3 history of coronary artery disease status post PCI: No chest pain shortness of breath or other anginal equivalent. Continue with aspirin and atenolol.  #4 COPD: She continues to smoke cigarettes. This was discussed with her today. She would like to quit, though is still pretty contemplative. Continue albuterol as needed Advair daily.  #5 anxiety: Controlled at this time. Continue with BuSpar and  Cymbalta  #6 chronic pain secondary to cancer and history of opiate abuse: Continue methadone  Disposition: Patient will be discharged home. She lives with her son who she reports is helping in her care. She is wheelchair bound and does her own transfers. Physical therapy evaluation is pending for additional needs. We will also ask for home health nursing for wound care and close monitoring. She will follow-up 2 days from now for chemotherapy. She will follow-up with her primary care physician in one week.  DISCHARGE CONDITIONS:   Stable  CONSULTS OBTAINED:    None  DRUG ALLERGIES:   Allergies  Allergen Reactions  . Penicillins Shortness Of Breath, Itching and Other (See Comments)    Has patient had a PCN reaction causing immediate rash, facial/tongue/throat swelling, SOB or lightheadedness with hypotension: Yes Has patient had a PCN reaction causing severe rash involving mucus membranes or skin necrosis: No Has patient had a PCN reaction that required hospitalization No Has patient had a PCN reaction occurring within the last 10 years: Yes If all of the above answers are "NO", then may proceed with Cephalosporin use.  . Ibuprofen Other (See Comments)    Reaction:  GI upset   . Prilosec [Omeprazole] Other (See Comments)    Reaction:  Involuntary muscle movements   . Tramadol Nausea Only  . Sulfa Antibiotics Itching and Rash    DISCHARGE MEDICATIONS:   Current Discharge Medication List    START taking these medications   Details  clindamycin (CLEOCIN) 300 MG capsule Take 1 capsule (300 mg total) by mouth 3 (three) times daily. Qty: 21 capsule, Refills: 0  CONTINUE these medications which have NOT CHANGED   Details  albuterol (PROVENTIL HFA;VENTOLIN HFA) 108 (90 BASE) MCG/ACT inhaler Inhale 2 puffs into the lungs every 6 (six) hours as needed for wheezing or shortness of breath. Qty: 1 Inhaler, Refills: 2    aspirin EC 81 MG tablet Take 81 mg by mouth daily.     atenolol (TENORMIN) 25 MG tablet Take 0.5 tablets (12.5 mg total) by mouth daily. Qty: 45 tablet, Refills: 3    busPIRone (BUSPAR) 10 MG tablet Take 1 tablet (10 mg total) by mouth 3 (three) times daily. Qty: 90 tablet, Refills: 6   Associated Diagnoses: Affective disorder (HCC)    diphenhydrAMINE (BENADRYL) 25 MG tablet Take 1 tablet (25 mg total) by mouth every 8 (eight) hours as needed for itching. Qty: 90 tablet, Refills: 0    DULoxetine (CYMBALTA) 30 MG capsule Take 90 mg by mouth daily.    exemestane (AROMASIN) 25 MG tablet Take 25 mg by mouth daily.    Fluticasone-Salmeterol (ADVAIR) 250-50 MCG/DOSE AEPB Inhale 1 puff into the lungs 2 (two) times daily.     gabapentin (NEURONTIN) 800 MG tablet Take 800 mg by mouth 4 (four) times daily.     levalbuterol (XOPENEX HFA) 45 MCG/ACT inhaler Inhale 2 puffs into the lungs every 4 (four) hours as needed for wheezing or shortness of breath. Qty: 1 Inhaler, Refills: 12   Associated Diagnoses: Simple chronic bronchitis (HCC)    levothyroxine (SYNTHROID, LEVOTHROID) 50 MCG tablet Take 1 tablet (50 mcg total) by mouth daily before breakfast. Qty: 30 tablet, Refills: 3    methadone (DOLOPHINE) 10 MG/ML solution Take 117 mg by mouth daily.   Associated Diagnoses: Breast cancer metastasized to lung, right (HCC)    ondansetron (ZOFRAN) 4 MG tablet Take 1 tablet (4 mg total) by mouth every 8 (eight) hours as needed for nausea or vomiting. Qty: 60 tablet, Refills: 2    pantoprazole (PROTONIX) 40 MG tablet Take 1 tablet (40 mg total) by mouth daily. Qty: 90 tablet, Refills: 3         DISCHARGE INSTRUCTIONS:    Regular diet. Stable condition. Home health nursing. No additional oxygen needs.  If you experience worsening of your admission symptoms, develop shortness of breath, life threatening emergency, suicidal or homicidal thoughts you must seek medical attention immediately by calling 911 or calling your MD immediately  if symptoms  less severe.  You Must read complete instructions/literature along with all the possible adverse reactions/side effects for all the Medicines you take and that have been prescribed to you. Take any new Medicines after you have completely understood and accept all the possible adverse reactions/side effects.   Please note  You were cared for by a hospitalist during your hospital stay. If you have any questions about your discharge medications or the care you received while you were in the hospital after you are discharged, you can call the unit and asked to speak with the hospitalist on call if the hospitalist that took care of you is not available. Once you are discharged, your primary care physician will handle any further medical issues. Please note that NO REFILLS for any discharge medications will be authorized once you are discharged, as it is imperative that you return to your primary care physician (or establish a relationship with a primary care physician if you do not have one) for your aftercare needs so that they can reassess your need for medications and monitor your lab values.  Today   CHIEF COMPLAINT:   Chief Complaint  Patient presents with  . Leg Pain    HISTORY OF PRESENT ILLNESS:  Emma-Lee Oddo is a 69 y.o. female with a known history of Breast Ca,COPD,Lung CA ,comes in with Right leg cellulitis despite oral levaquin.. Patient finished 7 days of Levaquin, but persistently has right leg redness, pain, low-grade temperature. Signing this patient is seen in the emergency room. She had history of MRSA infection. Admitting her for right leg cellulitis failed outpatient therapy. And complains of some nausea and headache, no other complaints. No chills. History of metastatic breast cancer, terminally ill getting oral chemotherapy.  VITAL SIGNS:  Blood pressure 143/59, pulse 87, temperature 99.4 F (37.4 C), temperature source Oral, resp. rate 16, height 5\' 4"  (1.626 m),  weight 52.164 kg (115 lb), SpO2 100 %.  I/O:   Intake/Output Summary (Last 24 hours) at 03/04/15 0909 Last data filed at 03/04/15 0412  Gross per 24 hour  Intake    120 ml  Output    400 ml  Net   -280 ml    PHYSICAL EXAMINATION:  GENERAL:  69 y.o.-year-old patient lying in the bed with no acute distress.  EYES: Pupils equal, round, reactive to light and accommodation. No scleral icterus. Extraocular muscles intact.  HEENT: Head atraumatic, normocephalic. Oropharynx and nasopharynx clear.  NECK:  Supple, no jugular venous distention. No thyroid enlargement, no tenderness.  LUNGS: Normal breath sounds bilaterally, no wheezing, rales,rhonchi or crepitation. No use of accessory muscles of respiration. Good air movement CARDIOVASCULAR: S1, S2 normal. No murmurs, rubs, or gallops.  ABDOMEN: Soft, non-tender, non-distended. Bowel sounds present. No organomegaly or mass. No guarding no rebound EXTREMITIES: No pedal edema, cyanosis, or clubbing. Peripheral pulses 1+. Multiple scabs over the right lower extremity, no swelling, drainage or induration. There is erythema which has decreased significantly from the area marked with pen yesterday. No tenderness. She is able to move the ankle and all joints of the foot. She does have a crack on the back of the right foot which does not appear infected. NEUROLOGIC: Cranial nerves II through XII are intact.  PSYCHIATRIC: The patient is alert and oriented x 3. Calm and appropriate SKIN: C above discussion of extremities  DATA REVIEW:   CBC  Recent Labs Lab 03/04/15 0458  WBC 3.8  HGB 9.6*  HCT 30.5*  PLT 132*    Chemistries   Recent Labs Lab 03/04/15 0458  NA 139  K 3.5  CL 105  CO2 29  GLUCOSE 109*  BUN 9  CREATININE 0.59  CALCIUM 8.5*    Cardiac Enzymes No results for input(s): TROPONINI in the last 168 hours.  Microbiology Results  Results for orders placed or performed during the hospital encounter of 09/06/14  Culture,  blood (single)     Status: None   Collection Time: 09/06/14  7:30 PM  Result Value Ref Range Status   Micro Text Report   Final       COMMENT                   NO GROWTH AEROBICALLY/ANAEROBICALLY IN 5 DAYS   ANTIBIOTIC                                                      Culture, blood (single)  Status: None (Preliminary result)   Collection Time: 09/06/14  7:47 PM  Result Value Ref Range Status   Micro Text Report   Preliminary       COMMENT                   NO GROWTH IN 90 HOURS   ANTIBIOTIC                                                      ED Influenza     Status: None   Collection Time: 09/06/14  7:49 PM  Result Value Ref Range Status   Influenza A By PCR NEGATIVE FOR INFLUENZA A (ANTIGEN ABSENT)  Final    Comment: A negative result does not exclude influenza. Correlation with clinical impression is required.    Influenza B By PCR NEGATIVE FOR INFLUENZA B (ANTIGEN ABSENT)  Final    RADIOLOGY:  US Venous Img Lower Bilateral  03/03/2015  CLINICAL DATA:  Swelling x1 week. Pain on the right. Right color change in ulceration. EXAM: BILATERAL LOWER EXTREMITY VENOUS DOPPLER ULTRASOUND TECHNIQUE: Gray-scale sonography with compression, as well as color and duplex ultrasound, were performed to evaluate the deep venous system from the level of the common femoral vein through the popliteal and proximal calf veins. COMPARISON:  05/20/2011 FINDINGS: Normal compressibility of the common femoral, superficial femoral, and popliteal veins, as well as the proximal calf veins. No filling defects to suggest DVT on grayscale or color Doppler imaging. Doppler waveforms show normal direction of venous flow, normal respiratory phasicity and response to augmentation. Bilateral inguinal lymph nodes are identified, all less than 1 cm short axis diameter. IMPRESSION: 1. No evidence of lower extremity deep vein thrombosis, BILATERALLY Electronically Signed   By: Lucrezia Europe M.D.   On: 03/03/2015 15:42     EKG:   Orders placed or performed in visit on 09/06/14  . EKG 12-Lead      Management plans discussed with the patient, family and they are in agreement.  CODE STATUS:     Code Status Orders        Start     Ordered   03/03/15 1314  Full code   Continuous     03/03/15 1315      TOTAL TIME TAKING CARE OF THIS PATIENT: 40 minutes.  Greater than 50% of time spent in care coordination and counseling. Care plan discussed with the patient  Myrtis Ser M.D on 03/04/2015 at 9:09 AM  Between 7am to 6pm - Pager - [phone]  After 6pm go to [email or web address] - password [email or web address] [facility]  Office  [phone]  CC: Primary care physician; [name], MD

## 2015-03-04 NOTE — Plan of Care (Signed)
Problem: Discharge Progression Outcomes Goal: Other Discharge Outcomes/Goals Outcome: Progressing Plan of care, progression of goals. 1. Skin care includes alleyvn dressings to heels, lotion applied to back and shoulders, cellulitis to leg remains the same. 2. Pain controlled with prn tylenol.  Anxiety relieved with 1x dose of ativan. On scheduled buspar for anxiety. 3. Hemodynamically stable, afebrile, tolerating meals. 4. Anticipate home with family at discharge.

## 2015-03-04 NOTE — Evaluation (Signed)
Physical Therapy Evaluation Patient Details Name: Betty Edwards MRN: [id number] DOB: [birth year] Today's Date: 03/04/2015   History of Present Illness  Pt was admitted for RLE cellulitis. At baseline she is wheelchair bound and has not ambulated in 4 years due to severe R knee arthritis. Pt is modified independent for transfer from wheelchair to and from bed and commode. Pt has 24/7 assistance provided by combination of [place] aid and son. Pt reports she has a HH aid who assists with ADLs 5 days/week for 3 hours day. Pt reports 2 falls in the last 12 months. One fall occurred when she fell asleep and fell out of her wheelchair and the other occurred while transferring in the bathroom. Pt denies any PT needs at this time.  Clinical Impression  Pt is currently at her baseline level of functioning which is transfers only. She is able to demonstrate safe and proficient transfers with therapist from recliner to/from bed. Pt has not ambulated in 4 years due to severe R knee arthritis and pain. Pt has all the necessary caregiver support at home and has no further need for physical therapy at this time. Order will be completed. Please enter new order if status or needs change.     Follow Up Recommendations No PT follow up    Equipment Recommendations  None recommended by PT    Recommendations for Other Services       Precautions / Restrictions Precautions Precautions: Fall Restrictions Weight Bearing Restrictions: No      Mobility  Bed Mobility               General bed mobility comments: Pt received upright in recliner and left upright in recliner so not formally assessed. Pt denies difficulty with bed mobility  Transfers Overall transfer level: Needs assistance Equipment used: Rolling walker (2 wheeled) Transfers: Sit to/from Stand Sit to Stand: Min guard         General transfer comment: Pt demonstrated standing pivot transfer recliner<>bed. Pt able to perform without assistance  by therapist as long as surfaces are close. Pt reports transfers are at baseline status and pt does a suprisingly proficient job at standing and transferring. Pt unable to ambulate and has not ambulated in approximately 4 years due to severe arthiritis in R knee  Ambulation/Gait             General Gait Details: Unable  Stairs            Wheelchair Mobility    Modified Rankin (Stroke Patients Only)       Balance Overall balance assessment: Needs assistance   Sitting balance-Leahy Scale: Good     Standing balance support: Bilateral upper extremity supported Standing balance-Leahy Scale: Poor                               Pertinent Vitals/Pain Pain Assessment: No/denies pain    Home Living Family/patient expects to be discharged to:: Private residence Living Arrangements: Children;Other (Comment) ([place] aid) Available Help at Discharge: Neighbor;Family Type of Home: Apartment Home Access: Level entry     Home Layout: One level Home Equipment: Walker - 2 wheels;Wheelchair - Education officer, community - power;Bedside commode (no hospital bed)      Prior Function Level of Independence: Needs assistance   Gait / Transfers Assistance Needed: Modified independent holding kitchen and bathroom sink for support  ADL's / Homemaking Assistance Needed: Assist from [facility] aid  Hand Dominance        Extremity/Trunk Assessment   Upper Extremity Assessment: Generalized weakness;RUE deficits/detail RUE Deficits / Details: Limited and painful R shoulder flexion to approximately 100 degrees. Chronic prior to admission. LUE is generally weak but grossly functional for limited transfers. At least 4-/5 throughout         Lower Extremity Assessment: RLE deficits/detail;Generalized weakness RLE Deficits / Details: Severe arthritis in R knee resulting in what appears to be subluxed R tibia. R knee is externally rotation approximately 80-90 degrees with respect to her  femur and pt has no ability to perform active R knee extension against gravity. Pt is able to flex R hip and dorsiflex R ankle against gravity. LLE is at least 4-/5 throughout.        Communication   Communication: No difficulties  Cognition Arousal/Alertness: Awake/alert Behavior During Therapy: WFL for tasks assessed/performed Overall Cognitive Status: Within Functional Limits for tasks assessed                      General Comments      Exercises        Assessment/Plan    PT Assessment Patent does not need any further PT services  PT Diagnosis Generalized weakness   PT Problem List    PT Treatment Interventions     PT Goals (Current goals can be found in the Care Plan section) Acute Rehab PT Goals Patient Stated Goal: "I'm doing fine. I just want to go home when they are ready" PT Goal Formulation: With patient Time For Goal Achievement: 03/18/15 Potential to Achieve Goals: Good    Frequency     Barriers to discharge        Co-evaluation               End of Session Equipment Utilized During Treatment: Gait belt Activity Tolerance: Patient tolerated treatment well Patient left: in chair;with call bell/phone within reach;with chair alarm set (back recliner, pt refuses legs elevated due to pain)           Time: 1010-1027 PT Time Calculation (min) (ACUTE ONLY): 17 min   Charges:   PT Evaluation $Initial PT Evaluation Tier I: 1 Procedure     PT G Codes:       Lyndel Safe Huprich PT, DPT   Huprich,Jason 03/04/2015, 10:43 AM

## 2015-03-04 NOTE — Progress Notes (Signed)
Discharge instructions given- prescription for clindamycin faxed to [facility] notified

## 2015-03-04 NOTE — Progress Notes (Signed)
EMS is here to transport home-no distress noted

## 2015-03-04 NOTE — Discharge Instructions (Signed)

## 2015-03-04 NOTE — Care Management (Signed)
Admitted to [facility] with the diagnosis of cellulitis. Lives with son, Legrand Como [phone]). 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 Uses an electric wheelchair to aid in getting around. Mostly bedbound. Will need home health for dressing changes. [place]. Will update [employer]. 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 Would like to transported home per EMS. [name] RN MSN CCM Care Management [phone]

## 2015-03-06 ENCOUNTER — Inpatient Hospital Stay (HOSPITAL_BASED_OUTPATIENT_CLINIC_OR_DEPARTMENT_OTHER): Payer: Medicare Other | Admitting: Internal Medicine

## 2015-03-06 ENCOUNTER — Inpatient Hospital Stay: Payer: Medicare Other

## 2015-03-06 VITALS — BP 124/78 | HR 102 | Temp 97.2°F | Wt 115.0 lb

## 2015-03-06 DIAGNOSIS — R978 Other abnormal tumor markers: Secondary | ICD-10-CM

## 2015-03-06 DIAGNOSIS — I10 Essential (primary) hypertension: Secondary | ICD-10-CM

## 2015-03-06 DIAGNOSIS — C50911 Malignant neoplasm of unspecified site of right female breast: Secondary | ICD-10-CM | POA: Diagnosis present

## 2015-03-06 DIAGNOSIS — J45909 Unspecified asthma, uncomplicated: Secondary | ICD-10-CM

## 2015-03-06 DIAGNOSIS — Z9013 Acquired absence of bilateral breasts and nipples: Secondary | ICD-10-CM

## 2015-03-06 DIAGNOSIS — M2550 Pain in unspecified joint: Secondary | ICD-10-CM | POA: Diagnosis not present

## 2015-03-06 DIAGNOSIS — Z170 Estrogen receptor positive status [ER+]: Secondary | ICD-10-CM

## 2015-03-06 DIAGNOSIS — M129 Arthropathy, unspecified: Secondary | ICD-10-CM

## 2015-03-06 DIAGNOSIS — L03115 Cellulitis of right lower limb: Secondary | ICD-10-CM | POA: Diagnosis not present

## 2015-03-06 DIAGNOSIS — F1120 Opioid dependence, uncomplicated: Secondary | ICD-10-CM | POA: Diagnosis not present

## 2015-03-06 DIAGNOSIS — C7800 Secondary malignant neoplasm of unspecified lung: Principal | ICD-10-CM

## 2015-03-06 DIAGNOSIS — Z8614 Personal history of Methicillin resistant Staphylococcus aureus infection: Secondary | ICD-10-CM | POA: Diagnosis not present

## 2015-03-06 DIAGNOSIS — Z7982 Long term (current) use of aspirin: Secondary | ICD-10-CM

## 2015-03-06 DIAGNOSIS — Z79811 Long term (current) use of aromatase inhibitors: Secondary | ICD-10-CM | POA: Diagnosis not present

## 2015-03-06 DIAGNOSIS — Z79899 Other long term (current) drug therapy: Secondary | ICD-10-CM

## 2015-03-06 DIAGNOSIS — D649 Anemia, unspecified: Secondary | ICD-10-CM | POA: Diagnosis not present

## 2015-03-06 DIAGNOSIS — R531 Weakness: Secondary | ICD-10-CM

## 2015-03-06 DIAGNOSIS — M898X9 Other specified disorders of bone, unspecified site: Secondary | ICD-10-CM

## 2015-03-06 DIAGNOSIS — Z9221 Personal history of antineoplastic chemotherapy: Secondary | ICD-10-CM

## 2015-03-06 DIAGNOSIS — C7801 Secondary malignant neoplasm of right lung: Secondary | ICD-10-CM | POA: Diagnosis not present

## 2015-03-06 LAB — CBC WITH DIFFERENTIAL/PLATELET
BASOS ABS: 0 10*3/uL (ref 0–0.1)
Basophils Relative: 1 %
EOS ABS: 0.1 10*3/uL (ref 0–0.7)
EOS PCT: 3 %
HCT: 32.3 % — ABNORMAL LOW (ref 35.0–47.0)
Hemoglobin: 10.1 g/dL — ABNORMAL LOW (ref 12.0–16.0)
LYMPHS PCT: 50 %
Lymphs Abs: 1.6 10*3/uL (ref 1.0–3.6)
MCH: 28.3 pg (ref 26.0–34.0)
MCHC: 31.3 g/dL — ABNORMAL LOW (ref 32.0–36.0)
MCV: 90.4 fL (ref 80.0–100.0)
MONO ABS: 0.4 10*3/uL (ref 0.2–0.9)
Monocytes Relative: 11 %
Neutro Abs: 1.1 10*3/uL — ABNORMAL LOW (ref 1.4–6.5)
Neutrophils Relative %: 35 %
PLATELETS: 144 10*3/uL — AB (ref 150–440)
RBC: 3.57 MIL/uL — ABNORMAL LOW (ref 3.80–5.20)
RDW: 18.6 % — AB (ref 11.5–14.5)
WBC: 3.2 10*3/uL — AB (ref 3.6–11.0)

## 2015-03-06 LAB — COMPREHENSIVE METABOLIC PANEL
ALT: 9 U/L — ABNORMAL LOW (ref 14–54)
AST: 14 U/L — AB (ref 15–41)
Albumin: 3.3 g/dL — ABNORMAL LOW (ref 3.5–5.0)
Alkaline Phosphatase: 82 U/L (ref 38–126)
Anion gap: 5 (ref 5–15)
BUN: 10 mg/dL (ref 6–20)
CHLORIDE: 105 mmol/L (ref 101–111)
CO2: 29 mmol/L (ref 22–32)
Calcium: 8.4 mg/dL — ABNORMAL LOW (ref 8.9–10.3)
Creatinine, Ser: 0.63 mg/dL (ref 0.44–1.00)
Glucose, Bld: 61 mg/dL — ABNORMAL LOW (ref 65–99)
POTASSIUM: 3.6 mmol/L (ref 3.5–5.1)
SODIUM: 139 mmol/L (ref 135–145)
Total Bilirubin: 0.4 mg/dL (ref 0.3–1.2)
Total Protein: 6.3 g/dL — ABNORMAL LOW (ref 6.5–8.1)

## 2015-03-06 MED ORDER — FENTANYL 12 MCG/HR TD PT72: 12.0000 ug | MEDICATED_PATCH | TRANSDERMAL | Status: DC

## 2015-03-06 NOTE — Progress Notes (Signed)
[facility] OFFICE PROGRESS NOTE  Patient Care Team: Arlis Porta., MD as PCP - General (Family Medicine)   SUMMARY OF ONCOLOGIC HISTORY:  # 2008- STAGE II s/p Mastec; ER/PR- POS; Her2 Neg; Oncotype-High risk s/p AC x4; Sep 2008 Femara  # MARCH 2014- BREAST CANCER STAGE IV/metastatic Lung nodules/Ca-27-29 rising; MARCH 2014- STARTED [facility]  # MARCH 2015- Progression of Lung mets; STARTED Afinitor + Aromasin [poor tol]; July 2015- [place]   # Feb 2016- Progression in Lung; April 2016 [place];   # AUG 2016- Progression in Lung; AUG 2016 START Ibrance + Aromasin  INTERVAL HISTORY:  69 year old female patient with above history of metastatic breast cancer ER/PR 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  Patient was recently evaluated in the hospital  4 worsening cellulitis the right lower extremity.  Patient was treated with IV vancomycin; blood cultures were negative.  She is currently on clindamycin.   Patient denies any diarrhea.  Appetite is fair. No unusual cough or shortness of breath or chest pain. No fevers at home.  REVIEW OF SYSTEMS:  A complete 10 point review of system is done which is negative except mentioned above/history of present illness.   PAST MEDICAL HISTORY :  Past Medical History  Diagnosis Date  . Breast cancer metastasized to lung ([place]) 09/07/2014  . Carcinoma of breast metastatic to lung ([place]) 09/22/2014  . Hypertension   . MRSA (methicillin resistant Staphylococcus aureus)   . COPD (chronic obstructive pulmonary disease) ([place])     PAST SURGICAL HISTORY :   Past Surgical History  Procedure Laterality Date  . Mastectomy      FAMILY HISTORY :  No family history on file.  SOCIAL HISTORY:   Social History  Substance Use Topics  . Smoking status: Current Every Day Smoker -- 1.00 packs/day    Types: Cigarettes  .  Smokeless tobacco: Not on file     Comment: smoked for 46 years  . Alcohol Use: No    ALLERGIES:  is allergic to penicillins; ibuprofen; prilosec; tramadol; and sulfa antibiotics.  MEDICATIONS:  Current Outpatient Prescriptions  Medication Sig Dispense Refill  . albuterol (PROVENTIL HFA;VENTOLIN HFA) 108 (90 BASE) MCG/ACT inhaler Inhale 2 puffs into the lungs every 6 (six) hours as needed for wheezing or shortness of breath. 1 Inhaler 2  . aspirin EC 81 MG tablet Take 81 mg by mouth daily.    Marland Kitchen atenolol (TENORMIN) 25 MG tablet Take 0.5 tablets (12.5 mg total) by mouth daily. 45 tablet 3  . busPIRone (BUSPAR) 10 MG tablet Take 1 tablet (10 mg total) by mouth 3 (three) times daily. 90 tablet 6  . clindamycin (CLEOCIN) 300 MG capsule Take 1 capsule (300 mg total) by mouth 3 (three) times daily. 21 capsule 0  . diphenhydrAMINE (BENADRYL) 25 MG tablet Take 1 tablet (25 mg total) by mouth every 8 (eight) hours as needed for itching. 90 tablet 0  . DULoxetine (CYMBALTA) 30 MG capsule Take 90 mg by mouth daily.    Marland Kitchen exemestane (AROMASIN) 25 MG tablet Take 25 mg by mouth daily.    . Fluticasone-Salmeterol (ADVAIR) 250-50 MCG/DOSE AEPB Inhale 1 puff into the lungs 2 (two) times daily.     Marland Kitchen gabapentin (NEURONTIN) 800 MG tablet Take 800 mg by mouth 4 (four) times daily.     Marland Kitchen levalbuterol (XOPENEX HFA) 45 MCG/ACT inhaler Inhale 2  puffs into the lungs every 4 (four) hours as needed for wheezing or shortness of breath. 1 Inhaler 12  . levothyroxine (SYNTHROID, LEVOTHROID) 50 MCG tablet Take 1 tablet (50 mcg total) by mouth daily before breakfast. 30 tablet 3  . methadone (DOLOPHINE) 10 MG/ML solution Take 117 mg by mouth daily.    . ondansetron (ZOFRAN) 4 MG tablet Take 1 tablet (4 mg total) by mouth every 8 (eight) hours as needed for nausea or vomiting. 60 tablet 2  . pantoprazole (PROTONIX) 40 MG tablet Take 1 tablet (40 mg total) by mouth daily. 90 tablet 3  . fentaNYL (DURAGESIC - DOSED MCG/HR) 12  MCG/HR Place 1 patch (12.5 mcg total) onto the skin every 3 (three) days. 10 patch 0   No current facility-administered medications for this visit.    PHYSICAL EXAMINATION: ECOG PERFORMANCE STATUS: 3 - Symptomatic, >50% confined to bed  BP 124/78 mmHg  Pulse 102  Temp(Src) 97.2 F (36.2 C) (Tympanic)  Wt 115 lb (52.164 kg)  Filed Weights   03/06/15 1536  Weight: 115 lb (52.164 kg)    GENERAL:  Moderately built and moderately nourished. Alert, no distress and comfortable.    Alone. She is in a wheelchair/ secondary to arthritic changes. EYES: no pallor or icterus OROPHARYNX: no thrush or ulceration; poor dentition LYMPH:  no palpable lymphadenopathy in the cervical, axillary. LUNGS:  Decreased breath sounds bilaterally.  No wheeze or crackles HEART/CVS: regular rate & rhythm and no murmurs;  Mild erythema of the  Right lower extremity/ scabs present no open wounds.  ABDOMEN:abdomen soft, non-tender and normal bowel sounds Musculoskeletal:no cyanosis of digits;  Significant arthritic changes PSYCH: alert & oriented x 3 with fluent speech NEURO: no focal motor/sensory deficits SKIN:  no rashes or significant lesions  LABORATORY DATA:  I have reviewed the data as listed    Component Value Date/Time   NA 139 03/06/2015 1507   NA 134* 09/06/2014 1958   K 3.6 03/06/2015 1507   K 3.8 09/06/2014 1958   CL 105 03/06/2015 1507   CL 102 09/06/2014 1958   CO2 29 03/06/2015 1507   CO2 27 09/06/2014 1958   GLUCOSE 61* 03/06/2015 1507   GLUCOSE 95 09/06/2014 1958   BUN 10 03/06/2015 1507   BUN 12 09/06/2014 1958   CREATININE 0.63 03/06/2015 1507   CREATININE 0.63 09/06/2014 1958   CALCIUM 8.4* 03/06/2015 1507   CALCIUM 8.3* 09/06/2014 1958   PROT 6.3* 03/06/2015 1507   PROT 6.1* 09/06/2014 1958   ALBUMIN 3.3* 03/06/2015 1507   ALBUMIN 3.5 09/06/2014 1958   AST 14* 03/06/2015 1507   AST 15 09/06/2014 1958   ALT 9* 03/06/2015 1507   ALT 9* 09/06/2014 1958   ALKPHOS 82  03/06/2015 1507   ALKPHOS 87 09/06/2014 1958   BILITOT 0.4 03/06/2015 1507   BILITOT 0.6 09/06/2014 1958   GFRNONAA >60 03/06/2015 1507   GFRNONAA >60 09/06/2014 1958   GFRNONAA >60 06/13/2014 1320   GFRAA >60 03/06/2015 1507   GFRAA >60 09/06/2014 1958   GFRAA >60 06/13/2014 1320    No results found for: SPEP, UPEP  Lab Results  Component Value Date   WBC 3.2* 03/06/2015   NEUTROABS 1.1* 03/06/2015   HGB 10.1* 03/06/2015   HCT 32.3* 03/06/2015   MCV 90.4 03/06/2015   PLT 144* 03/06/2015      Chemistry      Component Value Date/Time   NA 139 03/06/2015 1507   NA 134* 09/06/2014 1958  K 3.6 03/06/2015 1507   K 3.8 09/06/2014 1958   CL 105 03/06/2015 1507   CL 102 09/06/2014 1958   CO2 29 03/06/2015 1507   CO2 27 09/06/2014 1958   BUN 10 03/06/2015 1507   BUN 12 09/06/2014 1958   CREATININE 0.63 03/06/2015 1507   CREATININE 0.63 09/06/2014 1958      Component Value Date/Time   CALCIUM 8.4* 03/06/2015 1507   CALCIUM 8.3* 09/06/2014 1958   ALKPHOS 82 03/06/2015 1507   ALKPHOS 87 09/06/2014 1958   AST 14* 03/06/2015 1507   AST 15 09/06/2014 1958   ALT 9* 03/06/2015 1507   ALT 9* 09/06/2014 1958   BILITOT 0.4 03/06/2015 1507   BILITOT 0.6 09/06/2014 1958       RADIOGRAPHIC STUDIES: I have personally reviewed the radiological images as listed and agreed with the findings in the report. No results found.   ASSESSMENT & PLAN:   # Metastatic Breast cancer  Lungs  ER/PR positive HER-2/neu negative  Status post multiple lines of therapy most recently on ibrance plus Aromasin since August 2016.  Clinically no evidence of obvious progression noted.  Patient tolerating treatment fairly well [ recent cellulitis];  Today her white count is 3.2 ANC is 1.1.   #  I recommend  Starting  Ibrance 125 mg For the next one week [ which had been interrupted because of cellulitis].  Repeat labs/ CA-27-29 in 2 weeks.  Based on the labs she would be recommended to start   Ibrance  again in approximately 2 weeks.  #  I'll plan to get a follow-up CT chest abdomen and pelvis in approximately 6 weeks/ a few days prior to next visit.   #  With the worsening pain in the right knee-  I recommend adding low-dose of fentanyl patch 12.5 g;  Patient is on chronic methadone for chronic pain.    Orders Placed This Encounter  Procedures  . CT Chest W Contrast    Standing Status: Future     Number of Occurrences:      Standing Expiration Date: 05/05/2016    Order Specific Question:  Reason for Exam (SYMPTOM  OR DIAGNOSIS REQUIRED)    Answer:  breast cancer with metastasis    Order Specific Question:  Preferred imaging location?    Answer:  [facility]  . CT Abdomen Pelvis W Contrast    Standing Status: Future     Number of Occurrences:      Standing Expiration Date: 06/04/2016    Order Specific Question:  Reason for Exam (SYMPTOM  OR DIAGNOSIS REQUIRED)    Answer:  breast cancer with metastasis    Order Specific Question:  Preferred imaging location?    Answer:  [facility]   All questions were answered. The patient knows to call the clinic with any problems, questions or concerns. No barriers to learning was detected.  I spent 25 minutes counseling the patient face to face. The total time spent in the appointment was 30 minutes and more than 50% was on counseling and review of test results     Cammie Sickle, MD 03/06/2015 4:23 PM

## 2015-03-06 NOTE — Progress Notes (Signed)
Patient alert x 3, brought to exam room 3 via wheelchair, pt c/o of intermittent mid back pain 7 oof 10 and  Intermittent lower abdominal pain 8 oof 10.

## 2015-03-08 LAB — CULTURE, BLOOD (ROUTINE X 2)
CULTURE: NO GROWTH
Culture: NO GROWTH

## 2015-03-09 ENCOUNTER — Telehealth: Payer: Self-pay | Admitting: *Deleted

## 2015-03-09 NOTE — Telephone Encounter (Signed)
Spoke with son and informed that she is to restart her [place] today. adn that appts would be made and they will be contacted

## 2015-03-09 NOTE — Telephone Encounter (Signed)
Per md and as stated in his note, she needs to start taking the Ibrance again-starting today.  We will recheck her labs in 2 weeks.  The CT scan needs to be scheduled in 6 weeks. It appears that her f/u appointments have not been scheduled at this time. I will send scheduling a msg to get this scheduled. They can call and/or mail her these appointments.

## 2015-03-09 NOTE — Telephone Encounter (Signed)
Asking when she is to restart her cancer med and when she is to have a CT done

## 2015-03-11 ENCOUNTER — Inpatient Hospital Stay: Payer: Medicare Other | Admitting: Family Medicine

## 2015-03-13 ENCOUNTER — Inpatient Hospital Stay: Payer: Medicare Other | Admitting: Family Medicine

## 2015-03-17 ENCOUNTER — Telehealth: Payer: Self-pay | Admitting: *Deleted

## 2015-03-17 MED ORDER — EXEMESTANE 25 MG PO TABS: 25.0000 mg | ORAL_TABLET | Freq: Every day | ORAL | Status: DC

## 2015-03-17 NOTE — Telephone Encounter (Signed)
Escribed

## 2015-03-20 ENCOUNTER — Telehealth: Payer: Self-pay | Admitting: Internal Medicine

## 2015-03-20 ENCOUNTER — Inpatient Hospital Stay: Payer: Medicare Other | Attending: Internal Medicine

## 2015-03-20 DIAGNOSIS — C50911 Malignant neoplasm of unspecified site of right female breast: Secondary | ICD-10-CM | POA: Insufficient documentation

## 2015-03-20 DIAGNOSIS — C7800 Secondary malignant neoplasm of unspecified lung: Secondary | ICD-10-CM

## 2015-03-20 LAB — COMPREHENSIVE METABOLIC PANEL
ALBUMIN: 3.1 g/dL — AB (ref 3.5–5.0)
ALK PHOS: 82 U/L (ref 38–126)
ALT: 8 U/L — ABNORMAL LOW (ref 14–54)
AST: 15 U/L (ref 15–41)
Anion gap: 7 (ref 5–15)
BILIRUBIN TOTAL: 0.5 mg/dL (ref 0.3–1.2)
BUN: 13 mg/dL (ref 6–20)
CALCIUM: 8.5 mg/dL — AB (ref 8.9–10.3)
CO2: 29 mmol/L (ref 22–32)
CREATININE: 0.66 mg/dL (ref 0.44–1.00)
Chloride: 102 mmol/L (ref 101–111)
GFR calc Af Amer: >60 mL/min (ref >60)
GFR calc non Af Amer: >60 mL/min (ref >60)
GLUCOSE: 81 mg/dL (ref 65–99)
Potassium: 3.6 mmol/L (ref 3.5–5.1)
Sodium: 138 mmol/L (ref 135–145)
TOTAL PROTEIN: 6.5 g/dL (ref 6.5–8.1)

## 2015-03-20 LAB — CBC WITH DIFFERENTIAL/PLATELET
BASOS ABS: 0 10*3/uL (ref 0–0.1)
BASOS PCT: 1 %
Eosinophils Absolute: 0.1 10*3/uL (ref 0–0.7)
Eosinophils Relative: 2 %
HEMATOCRIT: 31.5 % — AB (ref 35.0–47.0)
HEMOGLOBIN: 9.9 g/dL — AB (ref 12.0–16.0)
Lymphocytes Relative: 48 %
Lymphs Abs: 1.9 10*3/uL (ref 1.0–3.6)
MCH: 28.6 pg (ref 26.0–34.0)
MCHC: 31.4 g/dL — ABNORMAL LOW (ref 32.0–36.0)
MCV: 91.1 fL (ref 80.0–100.0)
MONOS PCT: 4 %
Monocytes Absolute: 0.2 10*3/uL (ref 0.2–0.9)
NEUTROS ABS: 1.8 10*3/uL (ref 1.4–6.5)
NEUTROS PCT: 45 %
Platelets: 235 10*3/uL (ref 150–440)
RBC: 3.46 MIL/uL — ABNORMAL LOW (ref 3.80–5.20)
RDW: 19.5 % — ABNORMAL HIGH (ref 11.5–14.5)
WBC: 4 10*3/uL (ref 3.6–11.0)

## 2015-03-20 NOTE — Telephone Encounter (Signed)
Called pt and got her voicemail and left message that labs are ok to start back on ibrance and AI.  Asked if she could call me back to make sure she got my message

## 2015-03-20 NOTE — Telephone Encounter (Signed)
Please inform patient that labs look okay- recommend starting ibrance/Aromasin as planned. Scans follow-up is planned.

## 2015-03-21 LAB — CANCER ANTIGEN 27.29: CA 27.29: 259.6 U/mL — ABNORMAL HIGH (ref 0.0–38.6)

## 2015-03-22 ENCOUNTER — Inpatient Hospital Stay
Admission: EM | Admit: 2015-03-22 | Discharge: 2015-03-23 | DRG: 603 | Disposition: A | Discharge status: Home / Self Care | Payer: Medicare Other | Attending: Internal Medicine | Admitting: Internal Medicine

## 2015-03-22 ENCOUNTER — Emergency Department: Payer: Medicare Other

## 2015-03-22 DIAGNOSIS — D638 Anemia in other chronic diseases classified elsewhere: Secondary | ICD-10-CM | POA: Diagnosis present

## 2015-03-22 DIAGNOSIS — Z833 Family history of diabetes mellitus: Secondary | ICD-10-CM | POA: Diagnosis not present

## 2015-03-22 DIAGNOSIS — Z79899 Other long term (current) drug therapy: Secondary | ICD-10-CM | POA: Diagnosis not present

## 2015-03-22 DIAGNOSIS — Z8614 Personal history of Methicillin resistant Staphylococcus aureus infection: Secondary | ICD-10-CM | POA: Diagnosis not present

## 2015-03-22 DIAGNOSIS — F17210 Nicotine dependence, cigarettes, uncomplicated: Secondary | ICD-10-CM | POA: Diagnosis present

## 2015-03-22 DIAGNOSIS — Z9221 Personal history of antineoplastic chemotherapy: Secondary | ICD-10-CM | POA: Diagnosis not present

## 2015-03-22 DIAGNOSIS — Z888 Allergy status to other drugs, medicaments and biological substances status: Secondary | ICD-10-CM

## 2015-03-22 DIAGNOSIS — J449 Chronic obstructive pulmonary disease, unspecified: Secondary | ICD-10-CM | POA: Diagnosis present

## 2015-03-22 DIAGNOSIS — M1990 Unspecified osteoarthritis, unspecified site: Secondary | ICD-10-CM | POA: Diagnosis present

## 2015-03-22 DIAGNOSIS — C7801 Secondary malignant neoplasm of right lung: Secondary | ICD-10-CM | POA: Diagnosis present

## 2015-03-22 DIAGNOSIS — Z886 Allergy status to analgesic agent status: Secondary | ICD-10-CM | POA: Diagnosis not present

## 2015-03-22 DIAGNOSIS — Z7982 Long term (current) use of aspirin: Secondary | ICD-10-CM

## 2015-03-22 DIAGNOSIS — L03115 Cellulitis of right lower limb: Principal | ICD-10-CM | POA: Diagnosis present

## 2015-03-22 DIAGNOSIS — Z853 Personal history of malignant neoplasm of breast: Secondary | ICD-10-CM | POA: Diagnosis not present

## 2015-03-22 DIAGNOSIS — Z882 Allergy status to sulfonamides status: Secondary | ICD-10-CM

## 2015-03-22 DIAGNOSIS — E039 Hypothyroidism, unspecified: Secondary | ICD-10-CM | POA: Diagnosis present

## 2015-03-22 DIAGNOSIS — K219 Gastro-esophageal reflux disease without esophagitis: Secondary | ICD-10-CM | POA: Diagnosis present

## 2015-03-22 DIAGNOSIS — Z880 Allergy status to penicillin: Secondary | ICD-10-CM

## 2015-03-22 DIAGNOSIS — F418 Other specified anxiety disorders: Secondary | ICD-10-CM | POA: Diagnosis present

## 2015-03-22 DIAGNOSIS — I10 Essential (primary) hypertension: Secondary | ICD-10-CM | POA: Diagnosis present

## 2015-03-22 DIAGNOSIS — D72819 Decreased white blood cell count, unspecified: Secondary | ICD-10-CM | POA: Diagnosis present

## 2015-03-22 LAB — CBC WITH DIFFERENTIAL/PLATELET
BASOS ABS: 0 10*3/uL (ref 0–0.1)
Basophils Relative: 1 %
EOS PCT: 3 %
Eosinophils Absolute: 0.1 10*3/uL (ref 0–0.7)
HCT: 31.1 % — ABNORMAL LOW (ref 35.0–47.0)
Hemoglobin: 9.8 g/dL — ABNORMAL LOW (ref 12.0–16.0)
LYMPHS PCT: 42 %
Lymphs Abs: 1.3 10*3/uL (ref 1.0–3.6)
MCH: 29.2 pg (ref 26.0–34.0)
MCHC: 31.5 g/dL — ABNORMAL LOW (ref 32.0–36.0)
MCV: 92.8 fL (ref 80.0–100.0)
MONO ABS: 0.2 10*3/uL (ref 0.2–0.9)
Monocytes Relative: 6 %
Neutro Abs: 1.5 10*3/uL (ref 1.4–6.5)
Neutrophils Relative %: 48 %
PLATELETS: 169 10*3/uL (ref 150–440)
RBC: 3.35 MIL/uL — ABNORMAL LOW (ref 3.80–5.20)
RDW: 19.8 % — AB (ref 11.5–14.5)
WBC: 3.2 10*3/uL — ABNORMAL LOW (ref 3.6–11.0)

## 2015-03-22 LAB — BASIC METABOLIC PANEL
Anion gap: 5 (ref 5–15)
BUN: 13 mg/dL (ref 6–20)
CALCIUM: 8.7 mg/dL — AB (ref 8.9–10.3)
CO2: 28 mmol/L (ref 22–32)
CREATININE: 0.69 mg/dL (ref 0.44–1.00)
Chloride: 105 mmol/L (ref 101–111)
GFR calc Af Amer: >60 mL/min (ref >60)
GLUCOSE: 96 mg/dL (ref 65–99)
Potassium: 3.8 mmol/L (ref 3.5–5.1)
Sodium: 138 mmol/L (ref 135–145)

## 2015-03-22 MED ORDER — BUSPIRONE HCL 10 MG PO TABS
10.0000 mg | ORAL_TABLET | Freq: Three times a day (TID) | ORAL | Status: DC
  Administered 2015-03-22 – 2015-03-23 (×3): 10 mg via ORAL
  Filled 2015-03-22 (×4): qty 1

## 2015-03-22 MED ORDER — FENTANYL 12 MCG/HR TD PT72
12.0000 ug | MEDICATED_PATCH | TRANSDERMAL | Status: DC
  Administered 2015-03-22: 12.5 ug via TRANSDERMAL
  Filled 2015-03-22: qty 1

## 2015-03-22 MED ORDER — SODIUM CHLORIDE 0.9 % IJ SOLN: 3.0000 mL | INTRAMUSCULAR | Status: DC | PRN

## 2015-03-22 MED ORDER — EXEMESTANE 25 MG PO TABS
25.0000 mg | ORAL_TABLET | Freq: Every day | ORAL | Status: DC
  Administered 2015-03-23: 25 mg via ORAL
  Filled 2015-03-22 (×2): qty 1

## 2015-03-22 MED ORDER — LEVOTHYROXINE SODIUM 50 MCG PO TABS
50.0000 ug | ORAL_TABLET | Freq: Every day | ORAL | Status: DC
  Administered 2015-03-23: 50 ug via ORAL
  Filled 2015-03-22: qty 1

## 2015-03-22 MED ORDER — LEVALBUTEROL HCL 0.63 MG/3ML IN NEBU: 0.6300 mg | INHALATION_SOLUTION | RESPIRATORY_TRACT | Status: DC | PRN

## 2015-03-22 MED ORDER — GABAPENTIN 800 MG PO TABS
800.0000 mg | ORAL_TABLET | Freq: Four times a day (QID) | ORAL | Status: DC
  Administered 2015-03-22 – 2015-03-23 (×4): 800 mg via ORAL
  Filled 2015-03-22 (×5): qty 1

## 2015-03-22 MED ORDER — ONDANSETRON HCL 4 MG PO TABS: 4.0000 mg | ORAL_TABLET | Freq: Four times a day (QID) | ORAL | Status: DC | PRN

## 2015-03-22 MED ORDER — VANCOMYCIN HCL IN DEXTROSE 1-5 GM/200ML-% IV SOLN
1000.0000 mg | Freq: Once | INTRAVENOUS | Status: AC
  Administered 2015-03-22: 1000 mg via INTRAVENOUS
  Filled 2015-03-22: qty 200

## 2015-03-22 MED ORDER — HEPARIN SODIUM (PORCINE) 5000 UNIT/ML IJ SOLN
5000.0000 [IU] | Freq: Three times a day (TID) | INTRAMUSCULAR | Status: DC
  Administered 2015-03-22 – 2015-03-23 (×3): 5000 [IU] via SUBCUTANEOUS
  Filled 2015-03-22 (×3): qty 1

## 2015-03-22 MED ORDER — SODIUM CHLORIDE 0.9 % IJ SOLN
3.0000 mL | Freq: Two times a day (BID) | INTRAMUSCULAR | Status: DC
  Administered 2015-03-23: 3 mL via INTRAVENOUS

## 2015-03-22 MED ORDER — SODIUM CHLORIDE 0.9 % IV SOLN: 250.0000 mL | INTRAVENOUS | Status: DC | PRN

## 2015-03-22 MED ORDER — MOMETASONE FURO-FORMOTEROL FUM 100-5 MCG/ACT IN AERO
2.0000 | INHALATION_SPRAY | Freq: Two times a day (BID) | RESPIRATORY_TRACT | Status: DC
  Filled 2015-03-22: qty 8.8

## 2015-03-22 MED ORDER — PANTOPRAZOLE SODIUM 40 MG PO TBEC
40.0000 mg | DELAYED_RELEASE_TABLET | Freq: Every day | ORAL | Status: DC
  Administered 2015-03-23: 09:00:00 40 mg via ORAL
  Filled 2015-03-22: qty 1

## 2015-03-22 MED ORDER — DIPHENHYDRAMINE HCL 25 MG PO TABS
25.0000 mg | ORAL_TABLET | Freq: Three times a day (TID) | ORAL | Status: DC | PRN
  Filled 2015-03-22: qty 1

## 2015-03-22 MED ORDER — METHADONE HCL 10 MG/ML PO CONC
117.0000 mg | Freq: Every day | ORAL | Status: DC
  Administered 2015-03-23: 117 mg via ORAL
  Filled 2015-03-22 (×3): qty 11.7

## 2015-03-22 MED ORDER — ALBUTEROL SULFATE (2.5 MG/3ML) 0.083% IN NEBU: 2.5000 mg | INHALATION_SOLUTION | RESPIRATORY_TRACT | Status: DC | PRN

## 2015-03-22 MED ORDER — DIPHENHYDRAMINE HCL 25 MG PO CAPS: 25.0000 mg | ORAL_CAPSULE | Freq: Three times a day (TID) | ORAL | Status: DC | PRN

## 2015-03-22 MED ORDER — DULOXETINE HCL 60 MG PO CPEP
90.0000 mg | ORAL_CAPSULE | Freq: Every day | ORAL | Status: DC
  Administered 2015-03-23: 09:00:00 90 mg via ORAL
  Filled 2015-03-22: qty 1

## 2015-03-22 MED ORDER — ACETAMINOPHEN 325 MG PO TABS: 650.0000 mg | ORAL_TABLET | Freq: Four times a day (QID) | ORAL | Status: DC | PRN

## 2015-03-22 MED ORDER — ALBUTEROL SULFATE HFA 108 (90 BASE) MCG/ACT IN AERS: 2.0000 | INHALATION_SPRAY | Freq: Four times a day (QID) | RESPIRATORY_TRACT | Status: DC | PRN

## 2015-03-22 MED ORDER — ACETAMINOPHEN 650 MG RE SUPP: 650.0000 mg | Freq: Four times a day (QID) | RECTAL | Status: DC | PRN

## 2015-03-22 MED ORDER — ATENOLOL 12.5 MG HALF TABLET
12.5000 mg | ORAL_TABLET | Freq: Every day | ORAL | Status: DC
  Administered 2015-03-23: 11:00:00 12.5 mg via ORAL
  Filled 2015-03-22: qty 1

## 2015-03-22 MED ORDER — ALPRAZOLAM 0.25 MG PO TABS
0.2500 mg | ORAL_TABLET | Freq: Three times a day (TID) | ORAL | Status: DC | PRN
  Administered 2015-03-22 – 2015-03-23 (×2): 0.25 mg via ORAL
  Filled 2015-03-22 (×2): qty 1

## 2015-03-22 MED ORDER — CLINDAMYCIN PHOSPHATE 600 MG/50ML IV SOLN
600.0000 mg | Freq: Three times a day (TID) | INTRAVENOUS | Status: DC
  Administered 2015-03-22 – 2015-03-23 (×3): 600 mg via INTRAVENOUS
  Filled 2015-03-22 (×6): qty 50

## 2015-03-22 MED ORDER — NICOTINE 14 MG/24HR TD PT24
14.0000 mg | MEDICATED_PATCH | Freq: Every day | TRANSDERMAL | Status: DC
  Administered 2015-03-23: 09:00:00 14 mg via TRANSDERMAL
  Filled 2015-03-22: qty 1

## 2015-03-22 MED ORDER — ASPIRIN EC 81 MG PO TBEC
81.0000 mg | DELAYED_RELEASE_TABLET | Freq: Every day | ORAL | Status: DC
  Administered 2015-03-23: 09:00:00 81 mg via ORAL
  Filled 2015-03-22: qty 1

## 2015-03-22 MED ORDER — ONDANSETRON HCL 4 MG/2ML IJ SOLN: 4.0000 mg | Freq: Four times a day (QID) | INTRAMUSCULAR | Status: DC | PRN

## 2015-03-22 NOTE — ED Provider Notes (Signed)
[facility] Emergency Department Provider Note  ____________________________________________   I have reviewed the triage vital signs and the nursing notes.   HISTORY  Chief Complaint Leg Pain    HPI Betty Edwards is a 69 y.o. female with history of metastatic breast cancer on chemotherapy, hypertension, COPD who presents for evaluation of worsening cellulitis of the right lower extremity P patient was discharged from the hospital on the 26th of last month for the same complaint. She took the antibiotics and seemed like the infection nearly went away, however over the last several days it has recurred. She is getting chemotherapy, she denies any fever that she documented but she felt somewhat warm at home. She mentioned something to the charge nurse about shortness of breath and she denied it to me we will get a chest x-ray. An ice chest pain. Patient at baseline does not walk. 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 No trauma. Prior to the last admission, patient had been on Levaquin. She was discharged on clindamycin and took the entire course she states.   Past Medical History  Diagnosis Date  . Breast cancer metastasized to lung ([place]) 09/07/2014  . Carcinoma of breast metastatic to lung ([place]) 09/22/2014  . Hypertension   . MRSA (methicillin resistant Staphylococcus aureus)   . COPD (chronic obstructive pulmonary disease) [facility])     Patient Active Problem List   Diagnosis Date Noted  . Cellulitis 03/03/2015  . Carcinoma of breast metastatic to lung ([place]) 09/22/2014  . Fever 09/08/2014  . Fever presenting with conditions classified elsewhere 09/07/2014  . SIRS (systemic inflammatory response syndrome) ([place]) 09/07/2014  . Breast cancer metastasized to lung ([place]) 09/07/2014  . Hypertension 09/06/2014  . Arteriosclerosis of coronary artery 05/28/2013  . Chest pain 05/28/2013  . CAFL (chronic airflow limitation) ([place])  05/28/2013  . Adult hypothyroidism 05/28/2013  . Affective disorder ([place]) 05/28/2013  . Drug abuse, opioid type 05/28/2013    Past Surgical History  Procedure Laterality Date  . Mastectomy      Current Outpatient Rx  Name  Route  Sig  Dispense  Refill  . albuterol (PROVENTIL HFA;VENTOLIN HFA) 108 (90 BASE) MCG/ACT inhaler   Inhalation   Inhale 2 puffs into the lungs every 6 (six) hours as needed for wheezing or shortness of breath.   1 Inhaler   2   . aspirin EC 81 MG tablet   Oral   Take 81 mg by mouth daily.         Marland Kitchen atenolol (TENORMIN) 25 MG tablet   Oral   Take 0.5 tablets (12.5 mg total) by mouth daily.   45 tablet   3   . busPIRone (BUSPAR) 10 MG tablet   Oral   Take 1 tablet (10 mg total) by mouth 3 (three) times daily.   90 tablet   6   . clindamycin (CLEOCIN) 300 MG capsule   Oral   Take 1 capsule (300 mg total) by mouth 3 (three) times daily.   21 capsule   0   . diphenhydrAMINE (BENADRYL) 25 MG tablet   Oral   Take 1 tablet (25 mg total) by mouth every 8 (eight) hours as needed for itching.   90 tablet   0   . DULoxetine (CYMBALTA) 30 MG capsule   Oral   Take 90 mg by mouth daily.         Marland Kitchen exemestane (AROMASIN) 25 MG tablet  Oral   Take 1 tablet (25 mg total) by mouth daily.   30 tablet   1   . fentaNYL (DURAGESIC - DOSED MCG/HR) 12 MCG/HR   Transdermal   Place 1 patch (12.5 mcg total) onto the skin every 3 (three) days.   10 patch   0   . Fluticasone-Salmeterol (ADVAIR) 250-50 MCG/DOSE AEPB   Inhalation   Inhale 1 puff into the lungs 2 (two) times daily.          Marland Kitchen gabapentin (NEURONTIN) 800 MG tablet   Oral   Take 800 mg by mouth 4 (four) times daily.          Marland Kitchen levalbuterol (XOPENEX HFA) 45 MCG/ACT inhaler   Inhalation   Inhale 2 puffs into the lungs every 4 (four) hours as needed for wheezing or shortness of breath.   1 Inhaler   12   . levothyroxine (SYNTHROID, LEVOTHROID) 50 MCG tablet   Oral   Take 1 tablet  (50 mcg total) by mouth daily before breakfast.   30 tablet   3   . methadone (DOLOPHINE) 10 MG/ML solution   Oral   Take 117 mg by mouth daily.         . ondansetron (ZOFRAN) 4 MG tablet   Oral   Take 1 tablet (4 mg total) by mouth every 8 (eight) hours as needed for nausea or vomiting.   60 tablet   2   . pantoprazole (PROTONIX) 40 MG tablet   Oral   Take 1 tablet (40 mg total) by mouth daily.   90 tablet   3     Allergies Penicillins; Ibuprofen; Prilosec; Tramadol; and Sulfa antibiotics  No family history on file.  Social History Social History  Substance Use Topics  . Smoking status: Current Every Day Smoker -- 1.00 packs/day    Types: Cigarettes  . Smokeless tobacco: None     Comment: smoked for 46 years  . Alcohol Use: No    Review of Systems }Constitutional: No fever/chills Eyes: No visual changes. ENT: No sore throat. No stiff neck no neck pain Cardiovascular: Denies chest pain. Respiratory: Denies shortness of breath. Gastrointestinal:   no vomiting.  No diarrhea.  No constipation. Genitourinary: Negative for dysuria. Musculoskeletal: Negative lower extremity swelling Skin: Complains of infection to her right lower extremity Neurological: Negative for headaches, focal weakness or numbness. 10-point ROS otherwise negative.  ____________________________________________   PHYSICAL EXAM:  VITAL SIGNS: ED Triage Vitals  Enc Vitals Group     BP 03/22/15 1840 136/70 mmHg     Pulse Rate 03/22/15 1840 108     Resp 03/22/15 1840 16     Temp 03/22/15 1840 98.3 F (36.8 C)     Temp Source 03/22/15 1840 Oral     SpO2 03/22/15 1840 93 %     Weight 03/22/15 1840 129 lb 4.8 oz (58.65 kg)     Height 03/22/15 1840 5\' 5"  (1.651 m)     Head Cir --      Peak Flow --      Pain Score 03/22/15 1841 7     Pain Loc --      Pain Edu? --      Excl. in [place]? --     Constitutional: Alert and oriented. In no acute medical distress, patient has baseline health  issues however it is somewhat frail Eyes: Conjunctivae are normal. PERRL. EOMI. Head: Atraumatic. Nose: No congestion/rhinnorhea. Mouth/Throat: Mucous membranes are moist.  Oropharynx non-erythematous. Neck:  No stridor.   Nontender with no meningismus Cardiovascular: Normal rate, regular rhythm. Grossly normal heart sounds.  Good peripheral circulation. Respiratory: Normal respiratory effort.  No retractions. Lungs CTAB. Abdominal: Soft and nontender. No distention. No guarding no rebound Back:  There is no focal tenderness or step off there is no midline tenderness there are no lesions noted. there is no CVA tenderness Musculoskeletal: No lower extremity tenderness. No joint effusions, no DVT signs strong distal pulses no edema  There is significant deformity to the bilateral lower extremity from arthritic changes apparently specially in the right knee. These are chronic. Patient has circumferential erythema to the calf and the right lower extremity. She has multiple very small areas that appear to be excoriation. There is no crepitus palpated no abscess palpated. Multiple missing toenails noted.  Neurologic:  Normal speech and language. No gross focal neurologic deficits are appreciated.  Skin:  Skin is warm, dry and intact. See above. Psychiatric: Mood and affect are normal. Speech and behavior are normal.  ____________________________________________   LABS (all labs ordered are listed, but only abnormal results are displayed)  Labs Reviewed  CULTURE, BLOOD (ROUTINE X 2)  CULTURE, BLOOD (ROUTINE X 2)  CBC WITH DIFFERENTIAL/PLATELET  BASIC METABOLIC PANEL   ____________________________________________  EKG  I personally interpreted any EKGs ordered by me or triage  ____________________________________________  RADIOLOGY  I reviewed any imaging ordered by me or triage that were performed during my  shift ____________________________________________   PROCEDURES  Procedure(s) performed: None  Critical Care performed: None  ____________________________________________   INITIAL IMPRESSION / ASSESSMENT AND PLAN / ED COURSE  Pertinent labs & imaging results that were available during my care of the patient were reviewed by me and considered in my medical decision making (see chart for details).  Patient presents today complaining of a recurrent cellulitis. Patient unfortunately is on chemotherapy. She they will again require IV antibiotics. She is alert and going clindamycin and Levaquin, she may require vancomycin. ____________________________________________   FINAL CLINICAL IMPRESSION(S) / ED DIAGNOSES  Final diagnoses:  None     Schuyler Amor, MD 03/22/15 1909

## 2015-03-22 NOTE — H&P (Signed)
[place] [facility] [place] NAME: Betty Edwards    MR#:  [id number]  DATE OF BIRTH:  [birth year]  DATE OF ADMISSION:  03/22/2015  PRIMARY CARE PHYSICIAN: [name], MD   REQUESTING/REFERRING PHYSICIAN: Schuyler Amor, MD  CHIEF COMPLAINT:   Chief Complaint  Patient presents with  . Leg Pain  right leg pain and erythema for 2 days.  HISTORY OF PRESENT ILLNESS:  Betty Edwards  is a 69 y.o. female with a known history of breast cancer with metastasis on chemotherapy, hypertension and COPD. The patient was just discharged 26th of last month for right leg cellulitis. She was discharged with the clindamycin which she complete the whole course. The right leg cellulitis resolved. But for the past 2 days, she has had right leg pain and erythema. She denies any fever but has chills. She denies any other symptoms. She is on chemotherapy for breast cancer with metastasis.  PAST MEDICAL HISTORY:   Past Medical History  Diagnosis Date  . Breast cancer metastasized to lung ([place]) 09/07/2014  . Carcinoma of breast metastatic to lung ([place]) 09/22/2014  . Hypertension   . MRSA (methicillin resistant Staphylococcus aureus)   . COPD (chronic obstructive pulmonary disease) ([place])     PAST SURGICAL HISTORY:   Past Surgical History  Procedure Laterality Date  . Mastectomy      SOCIAL HISTORY:   Social History  Substance Use Topics  . Smoking status: Current Every Day Smoker -- 1.00 packs/day    Types: Cigarettes  . Smokeless tobacco: Not on file     Comment: smoked for 46 years  . Alcohol Use: No    FAMILY HISTORY:   Family History  Problem Relation Age of Onset  . Aneurysm Mother   . Diabetes Father     DRUG ALLERGIES:   Allergies  Allergen Reactions  . Penicillins Shortness Of Breath, Itching and Other (See Comments)    Has patient had a PCN reaction causing immediate rash, facial/tongue/throat swelling, SOB or lightheadedness with  hypotension: Yes Has patient had a PCN reaction causing severe rash involving mucus membranes or skin necrosis: No Has patient had a PCN reaction that required hospitalization No Has patient had a PCN reaction occurring within the last 10 years: Yes If all of the above answers are "NO", then may proceed with Cephalosporin use.  . Ibuprofen Other (See Comments)    Reaction:  GI upset   . Prilosec [Omeprazole] Other (See Comments)    Reaction:  Involuntary muscle movements   . Tramadol Nausea Only  . Sulfa Antibiotics Itching and Rash    REVIEW OF SYSTEMS:  CONSTITUTIONAL: No fever,but has chills, no  fatigue or weakness.  EYES: No blurred or double vision.  EARS, NOSE, AND THROAT: No tinnitus or ear pain.  RESPIRATORY: No cough, shortness of breath, wheezing or hemoptysis.  CARDIOVASCULAR: No chest pain, orthopnea, edema.  GASTROINTESTINAL: No nausea, vomiting, diarrhea or abdominal pain.  GENITOURINARY: No dysuria, hematuria.  ENDOCRINE: No polyuria, nocturia,  HEMATOLOGY: No anemia, easy bruising or bleeding SKIN: No rash or lesion. MUSCULOSKELETAL:Has arthritis.  Right leg pain and redness. NEUROLOGIC: No tingling, numbness, weakness.  PSYCHIATRY:Has anxietybut no depression.   MEDICATIONS AT HOME:   Prior to Admission medications   Medication Sig Start Date End Date Taking? Authorizing Provider  albuterol (PROVENTIL HFA;VENTOLIN HFA) 108 (90 BASE) MCG/ACT inhaler Inhale 2 puffs into the lungs every 6 (six) hours as needed for wheezing or shortness  of breath. 02/06/15   Leia Alf, MD  aspirin EC 81 MG tablet Take 81 mg by mouth daily.    Historical Provider, MD  atenolol (TENORMIN) 25 MG tablet Take 0.5 tablets (12.5 mg total) by mouth daily. 12/12/14   Arlis Porta., MD  busPIRone (BUSPAR) 10 MG tablet Take 1 tablet (10 mg total) by mouth 3 (three) times daily. 02/20/15   Arlis Porta., MD  clindamycin (CLEOCIN) 300 MG capsule Take 1 capsule (300 mg total) by  mouth 3 (three) times daily. 03/04/15   Aldean Jewett, MD  diphenhydrAMINE (BENADRYL) 25 MG tablet Take 1 tablet (25 mg total) by mouth every 8 (eight) hours as needed for itching. 02/09/15   Leia Alf, MD  DULoxetine (CYMBALTA) 30 MG capsule Take 90 mg by mouth daily.    Historical Provider, MD  exemestane (AROMASIN) 25 MG tablet Take 1 tablet (25 mg total) by mouth daily. 03/17/15   Cammie Sickle, MD  fentaNYL (DURAGESIC - DOSED MCG/HR) 12 MCG/HR Place 1 patch (12.5 mcg total) onto the skin every 3 (three) days. 03/06/15   Cammie Sickle, MD  Fluticasone-Salmeterol (ADVAIR) 250-50 MCG/DOSE AEPB Inhale 1 puff into the lungs 2 (two) times daily.     Historical Provider, MD  gabapentin (NEURONTIN) 800 MG tablet Take 800 mg by mouth 4 (four) times daily.     Historical Provider, MD  levalbuterol Penne Lash HFA) 45 MCG/ACT inhaler Inhale 2 puffs into the lungs every 4 (four) hours as needed for wheezing or shortness of breath. 01/28/15   Arlis Porta., MD  levothyroxine (SYNTHROID, LEVOTHROID) 50 MCG tablet Take 1 tablet (50 mcg total) by mouth daily before breakfast. 02/16/15   Arlis Porta., MD  methadone (DOLOPHINE) 10 MG/ML solution Take 117 mg by mouth daily.    Historical Provider, MD  ondansetron (ZOFRAN) 4 MG tablet Take 1 tablet (4 mg total) by mouth every 8 (eight) hours as needed for nausea or vomiting. 01/23/15   Leia Alf, MD  pantoprazole (PROTONIX) 40 MG tablet Take 1 tablet (40 mg total) by mouth daily. 12/12/14   Arlis Porta., MD      VITAL SIGNS:  Blood pressure 136/70, pulse 108, temperature 98.3 F (36.8 C), temperature source Oral, resp. rate 16, height 5\' 5"  (1.651 m), weight 58.65 kg (129 lb 4.8 oz), SpO2 93 %.  PHYSICAL EXAMINATION:  GENERAL:  69 y.o.-year-old patient lying in the bed with no acute distress.  EYES: Pupils equal, round, reactive to light and accommodation. No scleral icterus. Extraocular muscles intact.  HEENT: Head  atraumatic, normocephalic. Oropharynx and nasopharynx clear. Moist oral mucosa.  NECK:  Supple, no jugular venous distention. No thyroid enlargement, no tenderness.  LUNGS: Normal breath sounds bilaterally, no wheezing, rales,rhonchi or crepitation. No use of accessory muscles of respiration.  CARDIOVASCULAR: S1, S2 normal. No murmurs, rubs, or gallops.  ABDOMEN: Soft, nontender, nondistended. Bowel sounds present. No organomegaly or mass.  EXTREMITIES: No pedal edema, cyanosis, or clubbing. Right knee deformity due to arthritis. Tenderness and erythema on the right lower extremities under knee and up ANKLE.  NEUROLOGIC: Cranial nerves II through XII are intact. Muscle strength 4/5 in all extremities. Sensation intact. Gait not checked.  PSYCHIATRIC: The patient is alert and oriented x 3.  SKIN: No obvious rash, lesion, or ulcer.   LABORATORY PANEL:   CBC  Recent Labs Lab 03/22/15 1843  WBC 3.2*  HGB 9.8*  HCT 31.1*  PLT 169   ------------------------------------------------------------------------------------------------------------------  Chemistries   Recent Labs Lab 03/20/15 1408 03/22/15 1843  NA 138 138  K 3.6 3.8  CL 102 105  CO2 29 28  GLUCOSE 81 96  BUN 13 13  CREATININE 0.66 0.69  CALCIUM 8.5* 8.7*  AST 15  --   ALT 8*  --   ALKPHOS 82  --   BILITOT 0.5  --    ------------------------------------------------------------------------------------------------------------------  Cardiac Enzymes No results for input(s): TROPONINI in the last 168 hours. ------------------------------------------------------------------------------------------------------------------  RADIOLOGY:  Dg Chest 2 View  03/22/2015  CLINICAL DATA:  Acute onset of shortness of breath and generalized chest pain. Current history of lung cancer. Patient on chemotherapy. Initial encounter. EXAM: CHEST  2 VIEW COMPARISON:  Chest radiograph performed 09/06/2014, and CT of the chest performed  12/19/2014 FINDINGS: There is elevation of the right hemidiaphragm. Mild right basilar airspace opacity reflects the patient's known lung cancer. The left lung appears relatively clear. No definite pleural effusion or pneumothorax is seen. The heart is mildly enlarged. A right-sided chest port is noted ending about the distal SVC. No acute osseous abnormalities are seen. Clips are noted within the right upper quadrant, reflecting prior cholecystectomy. IMPRESSION: Elevation of the right hemidiaphragm. Known right-sided lung cancer again noted. Mild cardiomegaly. Electronically Signed   By: Garald Balding M.D.   On: 03/22/2015 20:04    EKG:   Orders placed or performed in visit on 09/06/14  . EKG 12-Lead    IMPRESSION AND PLAN:   Right leg cellulitis Leukopenia Breast cancer with metastasis on chemotherapy Anemia of chronic disease  Hypertension COPD Tobacco abuse  The patient will be admitted to medical floor. She was treated with the vancomycin 1 dose in the ED. I will start clindamycin IV every 8 hours. Follow-up CBC and blood culture. COPD, continue nebulizer treatment and DuoNeb when necessary. Hypertension, continue aspirin and atenolol. Smoking cessation was counseled for 3 minutes Will give nicotine patch.  All the records are reviewed and case discussed with ED provider. Management plans discussed with the patient, family and they are in agreement.  CODE STATUS: Full code TOTAL TIME TAKING CARE OF THIS PATIENT: 53 minutes.    Demetrios Loll M.D on 03/22/2015 at 8:12 PM  Between 7am to 6pm - Pager - [phone]  After 6pm go to [email or web address] - password [email or web address] [facility]  Office  [phone]  CC: Primary care physician; [name], MD

## 2015-03-22 NOTE — ED Notes (Signed)
Pt bib EMS from home w/ c/o R leg pain.  Per EMS, pt was seen here last week and admitted.  Pt in no resp distress but claims to be SOB

## 2015-03-23 LAB — CBC
HEMATOCRIT: 31.2 % — AB (ref 35.0–47.0)
HEMOGLOBIN: 10 g/dL — AB (ref 12.0–16.0)
MCH: 29.7 pg (ref 26.0–34.0)
MCHC: 32 g/dL (ref 32.0–36.0)
MCV: 92.6 fL (ref 80.0–100.0)
Platelets: 166 10*3/uL (ref 150–440)
RBC: 3.37 MIL/uL — AB (ref 3.80–5.20)
RDW: 19.9 % — ABNORMAL HIGH (ref 11.5–14.5)
WBC: 3.1 10*3/uL — ABNORMAL LOW (ref 3.6–11.0)

## 2015-03-23 LAB — BASIC METABOLIC PANEL
ANION GAP: 3 — AB (ref 5–15)
BUN: 10 mg/dL (ref 6–20)
CALCIUM: 8.5 mg/dL — AB (ref 8.9–10.3)
CO2: 29 mmol/L (ref 22–32)
Chloride: 106 mmol/L (ref 101–111)
Creatinine, Ser: 0.63 mg/dL (ref 0.44–1.00)
GFR calc non Af Amer: >60 mL/min (ref >60)
GLUCOSE: 85 mg/dL (ref 65–99)
POTASSIUM: 4 mmol/L (ref 3.5–5.1)
Sodium: 138 mmol/L (ref 135–145)

## 2015-03-23 MED ORDER — CLINDAMYCIN HCL 300 MG PO CAPS: 300.0000 mg | ORAL_CAPSULE | Freq: Three times a day (TID) | ORAL | Status: DC

## 2015-03-23 MED ORDER — HEPARIN SOD (PORK) LOCK FLUSH 100 UNIT/ML IV SOLN
500.0000 [IU] | INTRAVENOUS | Status: AC | PRN
  Administered 2015-03-23: 500 [IU]

## 2015-03-23 MED ORDER — SODIUM CHLORIDE 0.9 % IJ SOLN: 10.0000 mL | INTRAMUSCULAR | Status: DC | PRN

## 2015-03-23 MED ORDER — NICOTINE 14 MG/24HR TD PT24: 14.0000 mg | MEDICATED_PATCH | Freq: Every day | TRANSDERMAL | Status: DC

## 2015-03-23 MED ORDER — HEPARIN SOD (PORK) LOCK FLUSH 100 UNIT/ML IV SOLN
INTRAVENOUS | Status: AC
  Administered 2015-03-23: 15:00:00 500 [IU]
  Filled 2015-03-23: qty 5

## 2015-03-23 NOTE — Care Management (Signed)
Admitted to [facility] with the diagnosis of cellulitis of right leg. Discharged from this facility 03/04/15. Didn't see Dr. Luan Pulling after discharged from this facility. "He was out of town." Did go to the [facility] for chemotherapy last Friday. (Lung Cancer)  Betty Edwards from [facility] helps transport. Uses a taxi for transportation. Lives with son, Betty Edwards [phone]) for many years. Son doesn't work anymore, "Has diabetes and we don't have a car." States she does not walk anymore, uses a wheelchair to get around. Nursing assistant from [place] comes to her home to assist with her daily activities. [place] about 3 years ago. Good appetite. Fell x 1 since last discharge. Gets prescriptions filled at [facility]. No Life Alert. Will give information about Life Alert. [name] RN MSN CCM Care Management [phone]

## 2015-03-23 NOTE — Discharge Instructions (Signed)

## 2015-03-23 NOTE — Care Management Important Message (Signed)
Important Message  Patient Details  Name: Betty Edwards MRN: [id number] Date of Birth: 1945-12-29   Medicare Important Message Given:  Yes    [name], RN 03/23/2015, 8:11 AM

## 2015-03-23 NOTE — Care Management (Signed)
Discharge to home today per Dr. Leslye Peer. Ms. Levine says she needs transportation home.  Would like [facility]. States her son will be in the home when she gets there per rescue. [name] RN MSN CCM Case Management [phone]

## 2015-03-23 NOTE — Progress Notes (Signed)
D/C teaching given to pt. With teach back pt. Made aware of F/U appt. Right chest porta cath de accessed pt. Tolerated well. Transport called awaiting arrival for pickup

## 2015-03-23 NOTE — Discharge Summary (Signed)
[place] [facility] [place] NAME: Betty Edwards    MR#:  [id number]  DATE OF BIRTH:  14-Feb-1946  DATE OF ADMISSION:  03/22/2015 ADMITTING PHYSICIAN: Demetrios Loll, MD  DATE OF DISCHARGE: 03/23/2015  PRIMARY CARE PHYSICIAN: [name], MD    ADMISSION DIAGNOSIS:  Cellulitis of right lower extremity [id number]  DISCHARGE DIAGNOSIS:  Principal Problem:   Cellulitis of right leg   SECONDARY DIAGNOSIS:   Past Medical History  Diagnosis Date  . Breast cancer metastasized to lung ([place]) 09/07/2014  . Carcinoma of breast metastatic to lung ([place]) 09/22/2014  . Hypertension   . MRSA (methicillin resistant Staphylococcus aureus)   . COPD (chronic obstructive pulmonary disease) ([place])     HOSPITAL COURSE:   1. Cellulitis of the right lower extremity. Patient scratches and picks things quite often. I advised her not to do this because the skin is the best barrier for defense. She states that the redness is much less today and wants to go home and she does not want to stay here any longer in the hospital. Her next dose of IV clindamycin is at 2 PM. I will keep her here and then discharge after that. Clindamycin orally was E-prescribed into her pharmacy. 2. Breast cancer that metastasized to lung 3. Essential hypertension 4. COPD 5. Hypothyroid is some unspecified continue levothyroxine 6. Anxiety depression continue psychiatric medications 7. Gastroesophageal reflux disease without esophagitis on PPI  DISCHARGE CONDITIONS:   Fair  CONSULTS OBTAINED:  None  DRUG ALLERGIES:   Allergies  Allergen Reactions  . Penicillins Shortness Of Breath, Itching and Other (See Comments)    Has patient had a PCN reaction causing immediate rash, facial/tongue/throat swelling, SOB or lightheadedness with hypotension: Yes Has patient had a PCN reaction causing severe rash involving mucus membranes or skin necrosis: No Has patient had a PCN reaction that  required hospitalization No Has patient had a PCN reaction occurring within the last 10 years: Yes If all of the above answers are "NO", then may proceed with Cephalosporin use.  . Ibuprofen Other (See Comments)    Reaction:  GI upset   . Prilosec [Omeprazole] Other (See Comments)    Reaction:  Involuntary muscle movements   . Tramadol Nausea Only  . Sulfa Antibiotics Itching and Rash    DISCHARGE MEDICATIONS:   Current Discharge Medication List    START taking these medications   Details  clindamycin (CLEOCIN) 300 MG capsule Take 1 capsule (300 mg total) by mouth 3 (three) times daily. Qty: 28 capsule, Refills: 0    nicotine (NICODERM CQ - DOSED IN MG/24 HOURS) 14 mg/24hr patch Place 1 patch (14 mg total) onto the skin daily. Qty: 28 patch, Refills: 0      CONTINUE these medications which have NOT CHANGED   Details  albuterol (PROVENTIL HFA;VENTOLIN HFA) 108 (90 BASE) MCG/ACT inhaler Inhale 2 puffs into the lungs every 6 (six) hours as needed for wheezing or shortness of breath. Qty: 1 Inhaler, Refills: 2    atenolol (TENORMIN) 25 MG tablet Take 0.5 tablets (12.5 mg total) by mouth daily. Qty: 45 tablet, Refills: 3    busPIRone (BUSPAR) 10 MG tablet Take 1 tablet (10 mg total) by mouth 3 (three) times daily. Qty: 90 tablet, Refills: 6   Associated Diagnoses: Affective disorder (HCC)    diphenhydrAMINE (BENADRYL) 25 MG tablet Take 1 tablet (25 mg total) by mouth every 8 (eight) hours as needed for itching. Qty: 90 tablet,  Refills: 0    DULoxetine (CYMBALTA) 30 MG capsule Take 90 mg by mouth daily.    exemestane (AROMASIN) 25 MG tablet Take 1 tablet (25 mg total) by mouth daily. Qty: 30 tablet, Refills: 1    Fluticasone-Salmeterol (ADVAIR) 250-50 MCG/DOSE AEPB Inhale 1 puff into the lungs 2 (two) times daily as needed (for shortness of breath.).     gabapentin (NEURONTIN) 800 MG tablet Take 800 mg by mouth 4 (four) times daily.     IBRANCE 125 MG capsule Take 125 mg by  mouth daily.    levothyroxine (SYNTHROID, LEVOTHROID) 50 MCG tablet Take 1 tablet (50 mcg total) by mouth daily before breakfast. Qty: 30 tablet, Refills: 3    methadone (DOLOPHINE) 10 MG/ML solution Take 117 mg by mouth daily.   Associated Diagnoses: Breast cancer metastasized to lung, right (HCC)    ondansetron (ZOFRAN) 4 MG tablet Take 1 tablet (4 mg total) by mouth every 8 (eight) hours as needed for nausea or vomiting. Qty: 60 tablet, Refills: 2    pantoprazole (PROTONIX) 40 MG tablet Take 1 tablet (40 mg total) by mouth daily. Qty: 90 tablet, Refills: 3      STOP taking these medications     aspirin EC 81 MG tablet      fentaNYL (DURAGESIC - DOSED MCG/HR) 12 MCG/HR      levalbuterol (XOPENEX HFA) 45 MCG/ACT inhaler          DISCHARGE INSTRUCTIONS:   Follow-up with PMD one week  If you experience worsening of your admission symptoms, develop shortness of breath, life threatening emergency, suicidal or homicidal thoughts you must seek medical attention immediately by calling 911 or calling your MD immediately  if symptoms less severe.  You Must read complete instructions/literature along with all the possible adverse reactions/side effects for all the Medicines you take and that have been prescribed to you. Take any new Medicines after you have completely understood and accept all the possible adverse reactions/side effects.   Please note  You were cared for by a hospitalist during your hospital stay. If you have any questions about your discharge medications or the care you received while you were in the hospital after you are discharged, you can call the unit and asked to speak with the hospitalist on call if the hospitalist that took care of you is not available. Once you are discharged, your primary care physician will handle any further medical issues. Please note that NO REFILLS for any discharge medications will be authorized once you are discharged, as it is  imperative that you return to your primary care physician (or establish a relationship with a primary care physician if you do not have one) for your aftercare needs so that they can reassess your need for medications and monitor your lab values.    Today   CHIEF COMPLAINT:   Chief Complaint  Patient presents with  . Leg Pain    HISTORY OF PRESENT ILLNESS:  Betty Edwards  is a 69 y.o. female with presented with leg pain and erythema of the leg and diagnosed with cellulitis.   VITAL SIGNS:  Blood pressure 138/86, pulse 108, temperature 99 F (37.2 C), temperature source Oral, resp. rate 18, height 5\' 4"  (1.626 m), weight 56.065 kg (123 lb 9.6 oz), SpO2 93 %.    PHYSICAL EXAMINATION:  GENERAL:  69 y.o.-year-old patient lying in the bed with no acute distress.  EYES: Pupils equal, round, reactive to light and accommodation. No scleral icterus. Extraocular  muscles intact.  HEENT: Head atraumatic, normocephalic. Oropharynx and nasopharynx clear.  NECK:  Supple, no jugular venous distention. No thyroid enlargement, no tenderness.  LUNGS: Normal breath sounds bilaterally, no wheezing, rales,rhonchi or crepitation. No use of accessory muscles of respiration.  CARDIOVASCULAR: S1, S2 normal. 2/6 systolic murmurs, no rubs, or gallops.  ABDOMEN: Soft, non-tender, non-distended. Bowel sounds present. No organomegaly or mass.  EXTREMITIES: No pedal edema, cyanosis, or clubbing. Joint deformities all over. Worse in the right knee. NEUROLOGIC: Cranial nerves II through XII are intact. Muscle strength 5/5 in all extremities. Sensation intact. Gait not checked.  PSYCHIATRIC: The patient is alert and oriented x 3.  SKIN: Erythema circumferential right leg. Starting to fade. Numerous superficial excoriations and scabs seen.  DATA REVIEW:   CBC  Recent Labs Lab 03/23/15 0536  WBC 3.1*  HGB 10.0*  HCT 31.2*  PLT 166    Chemistries   Recent Labs Lab 03/20/15 1408  03/23/15 0536  NA 138   < > 138  K 3.6  < > 4.0  CL 102  < > 106  CO2 29  < > 29  GLUCOSE 81  < > 85  BUN 13  < > 10  CREATININE 0.66  < > 0.63  CALCIUM 8.5*  < > 8.5*  AST 15  --   --   ALT 8*  --   --   ALKPHOS 82  --   --   BILITOT 0.5  --   --   < > = values in this interval not displayed.   Microbiology Results  Results for orders placed or performed during the hospital encounter of 03/22/15  Culture, blood (routine x 2)     Status: None (Preliminary result)   Collection Time: 03/22/15  7:22 PM  Result Value Ref Range Status   Specimen Description BLOOD LEFT HAND  Final   Special Requests   Final    BOTTLES DRAWN AEROBIC AND ANAEROBIC 5 CC AEROBIC, 3 CC ANAEROBIC   Culture NO GROWTH < 24 HOURS  Final   Report Status PENDING  Incomplete  Culture, blood (routine x 2)     Status: None (Preliminary result)   Collection Time: 03/22/15  7:22 PM  Result Value Ref Range Status   Specimen Description BLOOD LEFT ASSIST CONTROL  Final   Special Requests BOTTLES DRAWN AEROBIC AND ANAEROBIC 5 CC  Final   Culture NO GROWTH < 24 HOURS  Final   Report Status PENDING  Incomplete    RADIOLOGY:  Dg Chest 2 View  03/22/2015  CLINICAL DATA:  Acute onset of shortness of breath and generalized chest pain. Current history of lung cancer. Patient on chemotherapy. Initial encounter. EXAM: CHEST  2 VIEW COMPARISON:  Chest radiograph performed 09/06/2014, and CT of the chest performed 12/19/2014 FINDINGS: There is elevation of the right hemidiaphragm. Mild right basilar airspace opacity reflects the patient's known lung cancer. The left lung appears relatively clear. No definite pleural effusion or pneumothorax is seen. The heart is mildly enlarged. A right-sided chest port is noted ending about the distal SVC. No acute osseous abnormalities are seen. Clips are noted within the right upper quadrant, reflecting prior cholecystectomy. IMPRESSION: Elevation of the right hemidiaphragm. Known right-sided lung cancer again noted.  Mild cardiomegaly. Electronically Signed   By: Garald Balding M.D.   On: 03/22/2015 20:04    Management plans discussed with the patient, and she is in agreement.  CODE STATUS:     Code Status Orders  Start     Ordered   03/22/15 2128  Full code   Continuous     03/22/15 2127      TOTAL TIME TAKING CARE OF THIS PATIENT: 35  minutes.    Loletha Grayer M.D on 03/23/2015 at 12:01 PM  Between 7am to 6pm - Pager - [phone]  After 6pm go to [email or web address] - password [email or web address] [facility]  Office  [phone]  CC: Primary care physician; [name], MD

## 2015-03-23 NOTE — Progress Notes (Signed)
Pt picked up by EMS with belongings. Medications in box given to patient.

## 2015-03-23 NOTE — Progress Notes (Signed)
   03/23/15 0945  Clinical Encounter Type  Visited With Patient  Visit Type Initial  Provided pastoral presence and support to patient on unit.  [place] [phone]

## 2015-03-26 ENCOUNTER — Other Ambulatory Visit: Payer: Self-pay | Admitting: *Deleted

## 2015-03-27 LAB — CULTURE, BLOOD (ROUTINE X 2)
CULTURE: NO GROWTH
Culture: NO GROWTH

## 2015-03-27 NOTE — Telephone Encounter (Signed)
[facility]. Patient has 3 RFs on file for the Ibrance 125 mg 1 tablet daily for 21 days and then off for 1 week.  [employer] has been trying to reach the patient for over a week to get her RF sent. The Patient was instructed to contact [phone] to arrange for urgent shipment. Patient is due to restart her ibrance next Monday. Read back and Teach back process performed with the patient.

## 2015-03-30 ENCOUNTER — Inpatient Hospital Stay: Payer: Medicare Other | Admitting: Family Medicine

## 2015-03-30 ENCOUNTER — Ambulatory Visit: Payer: Medicare Other | Admitting: Family Medicine

## 2015-04-10 ENCOUNTER — Inpatient Hospital Stay: Payer: Medicare Other | Attending: Internal Medicine

## 2015-04-10 DIAGNOSIS — I10 Essential (primary) hypertension: Secondary | ICD-10-CM | POA: Diagnosis not present

## 2015-04-10 DIAGNOSIS — C7800 Secondary malignant neoplasm of unspecified lung: Secondary | ICD-10-CM | POA: Insufficient documentation

## 2015-04-10 DIAGNOSIS — Z170 Estrogen receptor positive status [ER+]: Secondary | ICD-10-CM | POA: Diagnosis not present

## 2015-04-10 DIAGNOSIS — Z8614 Personal history of Methicillin resistant Staphylococcus aureus infection: Secondary | ICD-10-CM | POA: Diagnosis not present

## 2015-04-10 DIAGNOSIS — F419 Anxiety disorder, unspecified: Secondary | ICD-10-CM | POA: Insufficient documentation

## 2015-04-10 DIAGNOSIS — Z993 Dependence on wheelchair: Secondary | ICD-10-CM | POA: Insufficient documentation

## 2015-04-10 DIAGNOSIS — R5383 Other fatigue: Secondary | ICD-10-CM | POA: Insufficient documentation

## 2015-04-10 DIAGNOSIS — R05 Cough: Secondary | ICD-10-CM | POA: Diagnosis not present

## 2015-04-10 DIAGNOSIS — C50911 Malignant neoplasm of unspecified site of right female breast: Secondary | ICD-10-CM

## 2015-04-10 DIAGNOSIS — Z9049 Acquired absence of other specified parts of digestive tract: Secondary | ICD-10-CM | POA: Diagnosis not present

## 2015-04-10 DIAGNOSIS — J449 Chronic obstructive pulmonary disease, unspecified: Secondary | ICD-10-CM | POA: Diagnosis not present

## 2015-04-10 DIAGNOSIS — Z79899 Other long term (current) drug therapy: Secondary | ICD-10-CM | POA: Diagnosis not present

## 2015-04-10 DIAGNOSIS — R0602 Shortness of breath: Secondary | ICD-10-CM | POA: Insufficient documentation

## 2015-04-10 DIAGNOSIS — J45909 Unspecified asthma, uncomplicated: Secondary | ICD-10-CM | POA: Insufficient documentation

## 2015-04-10 DIAGNOSIS — F17210 Nicotine dependence, cigarettes, uncomplicated: Secondary | ICD-10-CM | POA: Diagnosis not present

## 2015-04-10 DIAGNOSIS — Z9010 Acquired absence of unspecified breast and nipple: Secondary | ICD-10-CM | POA: Insufficient documentation

## 2015-04-10 DIAGNOSIS — C50919 Malignant neoplasm of unspecified site of unspecified female breast: Secondary | ICD-10-CM | POA: Insufficient documentation

## 2015-04-10 LAB — CBC
HEMATOCRIT: 34 % — AB (ref 35.0–47.0)
Hemoglobin: 10.5 g/dL — ABNORMAL LOW (ref 12.0–16.0)
MCH: 29.2 pg (ref 26.0–34.0)
MCHC: 30.9 g/dL — ABNORMAL LOW (ref 32.0–36.0)
MCV: 94.4 fL (ref 80.0–100.0)
Platelets: 229 10*3/uL (ref 150–440)
RBC: 3.6 MIL/uL — AB (ref 3.80–5.20)
RDW: 21 % — ABNORMAL HIGH (ref 11.5–14.5)
WBC: 4.2 10*3/uL (ref 3.6–11.0)

## 2015-04-13 ENCOUNTER — Telehealth: Payer: Self-pay | Admitting: *Deleted

## 2015-04-13 MED ORDER — ALBUTEROL SULFATE HFA 108 (90 BASE) MCG/ACT IN AERS: 2.0000 | INHALATION_SPRAY | Freq: Four times a day (QID) | RESPIRATORY_TRACT | Status: AC | PRN

## 2015-04-13 NOTE — Telephone Encounter (Signed)
Escribed

## 2015-04-19 ENCOUNTER — Inpatient Hospital Stay
Admission: EM | Admit: 2015-04-19 | Discharge: 2015-04-20 | DRG: 871 | Disposition: A | Discharge status: Home Health | Payer: Medicare Other | Attending: Internal Medicine | Admitting: Internal Medicine

## 2015-04-19 ENCOUNTER — Emergency Department: Payer: Medicare Other

## 2015-04-19 ENCOUNTER — Encounter: Payer: Self-pay | Admitting: Emergency Medicine

## 2015-04-19 DIAGNOSIS — Z9049 Acquired absence of other specified parts of digestive tract: Secondary | ICD-10-CM

## 2015-04-19 DIAGNOSIS — C50919 Malignant neoplasm of unspecified site of unspecified female breast: Secondary | ICD-10-CM | POA: Diagnosis present

## 2015-04-19 DIAGNOSIS — R079 Chest pain, unspecified: Secondary | ICD-10-CM

## 2015-04-19 DIAGNOSIS — F329 Major depressive disorder, single episode, unspecified: Secondary | ICD-10-CM | POA: Diagnosis present

## 2015-04-19 DIAGNOSIS — Z7982 Long term (current) use of aspirin: Secondary | ICD-10-CM | POA: Diagnosis not present

## 2015-04-19 DIAGNOSIS — F17210 Nicotine dependence, cigarettes, uncomplicated: Secondary | ICD-10-CM | POA: Diagnosis present

## 2015-04-19 DIAGNOSIS — A419 Sepsis, unspecified organism: Secondary | ICD-10-CM | POA: Diagnosis not present

## 2015-04-19 DIAGNOSIS — G629 Polyneuropathy, unspecified: Secondary | ICD-10-CM | POA: Diagnosis present

## 2015-04-19 DIAGNOSIS — Z170 Estrogen receptor positive status [ER+]: Secondary | ICD-10-CM

## 2015-04-19 DIAGNOSIS — J449 Chronic obstructive pulmonary disease, unspecified: Secondary | ICD-10-CM | POA: Diagnosis present

## 2015-04-19 DIAGNOSIS — G894 Chronic pain syndrome: Secondary | ICD-10-CM | POA: Diagnosis present

## 2015-04-19 DIAGNOSIS — Z79899 Other long term (current) drug therapy: Secondary | ICD-10-CM | POA: Diagnosis not present

## 2015-04-19 DIAGNOSIS — J189 Pneumonia, unspecified organism: Secondary | ICD-10-CM | POA: Diagnosis present

## 2015-04-19 DIAGNOSIS — E876 Hypokalemia: Secondary | ICD-10-CM | POA: Diagnosis present

## 2015-04-19 DIAGNOSIS — J45909 Unspecified asthma, uncomplicated: Secondary | ICD-10-CM | POA: Diagnosis present

## 2015-04-19 DIAGNOSIS — Z9221 Personal history of antineoplastic chemotherapy: Secondary | ICD-10-CM

## 2015-04-19 DIAGNOSIS — J989 Respiratory disorder, unspecified: Secondary | ICD-10-CM

## 2015-04-19 DIAGNOSIS — Z8614 Personal history of Methicillin resistant Staphylococcus aureus infection: Secondary | ICD-10-CM

## 2015-04-19 DIAGNOSIS — C7800 Secondary malignant neoplasm of unspecified lung: Secondary | ICD-10-CM | POA: Diagnosis present

## 2015-04-19 DIAGNOSIS — R5081 Fever presenting with conditions classified elsewhere: Secondary | ICD-10-CM | POA: Diagnosis present

## 2015-04-19 DIAGNOSIS — F411 Generalized anxiety disorder: Secondary | ICD-10-CM | POA: Diagnosis present

## 2015-04-19 DIAGNOSIS — Z79811 Long term (current) use of aromatase inhibitors: Secondary | ICD-10-CM

## 2015-04-19 DIAGNOSIS — I10 Essential (primary) hypertension: Secondary | ICD-10-CM | POA: Diagnosis present

## 2015-04-19 DIAGNOSIS — Z515 Encounter for palliative care: Secondary | ICD-10-CM | POA: Diagnosis present

## 2015-04-19 DIAGNOSIS — C50911 Malignant neoplasm of unspecified site of right female breast: Secondary | ICD-10-CM

## 2015-04-19 DIAGNOSIS — L89159 Pressure ulcer of sacral region, unspecified stage: Secondary | ICD-10-CM

## 2015-04-19 DIAGNOSIS — Z9010 Acquired absence of unspecified breast and nipple: Secondary | ICD-10-CM

## 2015-04-19 DIAGNOSIS — R509 Fever, unspecified: Secondary | ICD-10-CM

## 2015-04-19 DIAGNOSIS — R0602 Shortness of breath: Secondary | ICD-10-CM

## 2015-04-19 DIAGNOSIS — E039 Hypothyroidism, unspecified: Secondary | ICD-10-CM | POA: Diagnosis present

## 2015-04-19 HISTORY — DX: Unspecified asthma, uncomplicated: J45.909

## 2015-04-19 LAB — CBC
HCT: 26.6 % — ABNORMAL LOW (ref 35.0–47.0)
Hemoglobin: 8.2 g/dL — ABNORMAL LOW (ref 12.0–16.0)
MCH: 29.5 pg (ref 26.0–34.0)
MCHC: 30.8 g/dL — ABNORMAL LOW (ref 32.0–36.0)
MCV: 95.8 fL (ref 80.0–100.0)
Platelets: 81 10*3/uL — ABNORMAL LOW (ref 150–440)
RBC: 2.78 MIL/uL — ABNORMAL LOW (ref 3.80–5.20)
RDW: 21.4 % — ABNORMAL HIGH (ref 11.5–14.5)
WBC: 2.2 10*3/uL — ABNORMAL LOW (ref 3.6–11.0)

## 2015-04-19 LAB — CBC WITH DIFFERENTIAL/PLATELET
BASOS PCT: 0 %
Basophils Absolute: 0 10*3/uL (ref 0–0.1)
EOS ABS: 0 10*3/uL (ref 0–0.7)
EOS PCT: 3 %
HCT: 31.8 % — ABNORMAL LOW (ref 35.0–47.0)
Hemoglobin: 10 g/dL — ABNORMAL LOW (ref 12.0–16.0)
LYMPHS ABS: 0.4 10*3/uL — AB (ref 1.0–3.6)
Lymphocytes Relative: 23 %
MCH: 29.5 pg (ref 26.0–34.0)
MCHC: 31.3 g/dL — ABNORMAL LOW (ref 32.0–36.0)
MCV: 94.2 fL (ref 80.0–100.0)
Monocytes Absolute: 0.1 10*3/uL — ABNORMAL LOW (ref 0.2–0.9)
Monocytes Relative: 7 %
Neutro Abs: 1.2 10*3/uL — ABNORMAL LOW (ref 1.4–6.5)
Neutrophils Relative %: 67 %
PLATELETS: 105 10*3/uL — AB (ref 150–440)
RBC: 3.38 MIL/uL — AB (ref 3.80–5.20)
RDW: 21.9 % — ABNORMAL HIGH (ref 11.5–14.5)
WBC: 1.7 10*3/uL — AB (ref 3.6–11.0)

## 2015-04-19 LAB — COMPREHENSIVE METABOLIC PANEL
ALK PHOS: 68 U/L (ref 38–126)
ALT: 11 U/L — ABNORMAL LOW (ref 14–54)
ANION GAP: 6 (ref 5–15)
AST: 20 U/L (ref 15–41)
Albumin: 3.3 g/dL — ABNORMAL LOW (ref 3.5–5.0)
BILIRUBIN TOTAL: 0.6 mg/dL (ref 0.3–1.2)
BUN: 13 mg/dL (ref 6–20)
CALCIUM: 8.5 mg/dL — AB (ref 8.9–10.3)
CO2: 26 mmol/L (ref 22–32)
Chloride: 108 mmol/L (ref 101–111)
Creatinine, Ser: 0.59 mg/dL (ref 0.44–1.00)
GLUCOSE: 82 mg/dL (ref 65–99)
Potassium: 3.2 mmol/L — ABNORMAL LOW (ref 3.5–5.1)
Sodium: 140 mmol/L (ref 135–145)
TOTAL PROTEIN: 6.3 g/dL — AB (ref 6.5–8.1)

## 2015-04-19 LAB — URINALYSIS COMPLETE WITH MICROSCOPIC (ARMC ONLY)
BACTERIA UA: NONE SEEN
Bilirubin Urine: NEGATIVE
Glucose, UA: NEGATIVE mg/dL
Hgb urine dipstick: NEGATIVE
KETONES UR: NEGATIVE mg/dL
Leukocytes, UA: NEGATIVE
Nitrite: NEGATIVE
PROTEIN: NEGATIVE mg/dL
Specific Gravity, Urine: 1.04 — ABNORMAL HIGH (ref 1.005–1.030)
pH: 5 (ref 5.0–8.0)

## 2015-04-19 LAB — BASIC METABOLIC PANEL
Anion gap: 3 — ABNORMAL LOW (ref 5–15)
BUN: 10 mg/dL (ref 6–20)
CHLORIDE: 110 mmol/L (ref 101–111)
CO2: 25 mmol/L (ref 22–32)
Calcium: 7.6 mg/dL — ABNORMAL LOW (ref 8.9–10.3)
Creatinine, Ser: 0.65 mg/dL (ref 0.44–1.00)
GFR calc Af Amer: >60 mL/min (ref >60)
GFR calc non Af Amer: >60 mL/min (ref >60)
GLUCOSE: 102 mg/dL — AB (ref 65–99)
POTASSIUM: 3.7 mmol/L (ref 3.5–5.1)
Sodium: 138 mmol/L (ref 135–145)

## 2015-04-19 LAB — LIPASE, BLOOD: LIPASE: 20 U/L (ref 11–51)

## 2015-04-19 LAB — LACTIC ACID, PLASMA
Lactic Acid, Venous: 0.7 mmol/L (ref 0.5–2.0)
Lactic Acid, Venous: 2 mmol/L (ref 0.5–2.0)
Lactic Acid, Venous: 0.9 mmol/L (ref 0.5–2.0)

## 2015-04-19 LAB — PROTIME-INR
INR: 1.17
Prothrombin Time: 15.1 seconds — ABNORMAL HIGH (ref 11.4–15.0)

## 2015-04-19 LAB — TROPONIN I: TROPONIN I: 0.03 ng/mL (ref <0.031)

## 2015-04-19 LAB — APTT: APTT: 35 s (ref 24–36)

## 2015-04-19 MED ORDER — MORPHINE SULFATE (PF) 4 MG/ML IV SOLN
4.0000 mg | INTRAVENOUS | Status: DC | PRN
  Administered 2015-04-19 – 2015-04-20 (×2): 4 mg via INTRAVENOUS
  Filled 2015-04-19 (×2): qty 1

## 2015-04-19 MED ORDER — NICOTINE 14 MG/24HR TD PT24
14.0000 mg | MEDICATED_PATCH | Freq: Every day | TRANSDERMAL | Status: DC
  Administered 2015-04-19 – 2015-04-20 (×2): 14 mg via TRANSDERMAL
  Filled 2015-04-19 (×2): qty 1

## 2015-04-19 MED ORDER — SODIUM CHLORIDE 0.9 % IV SOLN
INTRAVENOUS | Status: AC
  Administered 2015-04-19: 06:00:00 via INTRAVENOUS

## 2015-04-19 MED ORDER — POTASSIUM CHLORIDE CRYS ER 20 MEQ PO TBCR
20.0000 meq | EXTENDED_RELEASE_TABLET | Freq: Once | ORAL | Status: AC
  Administered 2015-04-19: 07:00:00 20 meq via ORAL
  Filled 2015-04-19: qty 1

## 2015-04-19 MED ORDER — SODIUM CHLORIDE 0.9 % IV BOLUS (SEPSIS)
1000.0000 mL | INTRAVENOUS | Status: AC
  Administered 2015-04-19 (×2): 1000 mL via INTRAVENOUS

## 2015-04-19 MED ORDER — ONDANSETRON HCL 4 MG/2ML IJ SOLN: 4.0000 mg | Freq: Four times a day (QID) | INTRAMUSCULAR | Status: DC | PRN

## 2015-04-19 MED ORDER — ACETAMINOPHEN 325 MG PO TABS
650.0000 mg | ORAL_TABLET | Freq: Four times a day (QID) | ORAL | Status: DC | PRN
Start: 2015-04-19 — End: 2015-04-21

## 2015-04-19 MED ORDER — LEVOTHYROXINE SODIUM 50 MCG PO TABS
50.0000 ug | ORAL_TABLET | Freq: Every day | ORAL | Status: DC
  Administered 2015-04-19 – 2015-04-20 (×2): 50 ug via ORAL
  Filled 2015-04-19 (×2): qty 1

## 2015-04-19 MED ORDER — ACETAMINOPHEN 325 MG PO TABS
650.0000 mg | ORAL_TABLET | Freq: Once | ORAL | Status: AC
  Administered 2015-04-19: 650 mg via ORAL
  Filled 2015-04-19: qty 2

## 2015-04-19 MED ORDER — MORPHINE SULFATE (PF) 4 MG/ML IV SOLN
4.0000 mg | INTRAVENOUS | Status: DC | PRN
  Administered 2015-04-19: 4 mg via INTRAVENOUS
  Filled 2015-04-19: qty 1

## 2015-04-19 MED ORDER — METHADONE HCL 10 MG/ML PO CONC
117.0000 mg | Freq: Every day | ORAL | Status: DC
  Administered 2015-04-19 – 2015-04-20 (×2): 117 mg via ORAL
  Filled 2015-04-19 (×4): qty 11.7

## 2015-04-19 MED ORDER — ATENOLOL 25 MG PO TABS
12.5000 mg | ORAL_TABLET | Freq: Every day | ORAL | Status: DC
  Administered 2015-04-19: 12.5 mg via ORAL
  Filled 2015-04-19: qty 1

## 2015-04-19 MED ORDER — PANTOPRAZOLE SODIUM 40 MG PO TBEC
40.0000 mg | DELAYED_RELEASE_TABLET | Freq: Every day | ORAL | Status: DC
  Administered 2015-04-19 – 2015-04-20 (×2): 40 mg via ORAL
  Filled 2015-04-19 (×2): qty 1

## 2015-04-19 MED ORDER — ACETAMINOPHEN 650 MG RE SUPP: 650.0000 mg | Freq: Four times a day (QID) | RECTAL | Status: DC | PRN

## 2015-04-19 MED ORDER — ENOXAPARIN SODIUM 40 MG/0.4ML ~~LOC~~ SOLN
40.0000 mg | Freq: Every day | SUBCUTANEOUS | Status: DC
Start: 2015-04-19 — End: 2015-04-21
  Administered 2015-04-19 – 2015-04-20 (×2): 40 mg via SUBCUTANEOUS
  Filled 2015-04-19 (×2): qty 0.4

## 2015-04-19 MED ORDER — ASPIRIN EC 81 MG PO TBEC
81.0000 mg | DELAYED_RELEASE_TABLET | Freq: Every day | ORAL | Status: DC
  Administered 2015-04-19 – 2015-04-20 (×2): 81 mg via ORAL
  Filled 2015-04-19 (×2): qty 1

## 2015-04-19 MED ORDER — ALBUTEROL SULFATE (2.5 MG/3ML) 0.083% IN NEBU: 2.5000 mg | INHALATION_SOLUTION | RESPIRATORY_TRACT | Status: DC | PRN

## 2015-04-19 MED ORDER — ONDANSETRON HCL 4 MG/2ML IJ SOLN
4.0000 mg | Freq: Once | INTRAMUSCULAR | Status: AC
Start: 2015-04-19 — End: 2015-04-19
  Administered 2015-04-19: 4 mg via INTRAVENOUS
  Filled 2015-04-19: qty 2

## 2015-04-19 MED ORDER — DULOXETINE HCL 60 MG PO CPEP
90.0000 mg | ORAL_CAPSULE | Freq: Every day | ORAL | Status: DC
  Administered 2015-04-19 – 2015-04-20 (×3): 90 mg via ORAL
  Filled 2015-04-19 (×3): qty 1

## 2015-04-19 MED ORDER — DEXTROSE 5 % IV SOLN
2.0000 g | Freq: Once | INTRAVENOUS | Status: AC
  Administered 2015-04-19: 2 g via INTRAVENOUS
  Filled 2015-04-19: qty 2

## 2015-04-19 MED ORDER — IOHEXOL 350 MG/ML SOLN
75.0000 mL | Freq: Once | INTRAVENOUS | Status: AC | PRN
  Administered 2015-04-19: 75 mL via INTRAVENOUS

## 2015-04-19 MED ORDER — BUSPIRONE HCL 10 MG PO TABS
10.0000 mg | ORAL_TABLET | Freq: Three times a day (TID) | ORAL | Status: DC
  Administered 2015-04-19 – 2015-04-20 (×5): 10 mg via ORAL
  Filled 2015-04-19 (×5): qty 1

## 2015-04-19 MED ORDER — MOMETASONE FURO-FORMOTEROL FUM 100-5 MCG/ACT IN AERO
2.0000 | INHALATION_SPRAY | Freq: Two times a day (BID) | RESPIRATORY_TRACT | Status: DC
  Administered 2015-04-19 – 2015-04-20 (×3): 2 via RESPIRATORY_TRACT
  Filled 2015-04-19: qty 8.8

## 2015-04-19 MED ORDER — AZTREONAM 2 G IJ SOLR
2.0000 g | Freq: Three times a day (TID) | INTRAMUSCULAR | Status: DC
  Administered 2015-04-19 – 2015-04-20 (×4): 2 g via INTRAVENOUS
  Filled 2015-04-19 (×7): qty 2

## 2015-04-19 MED ORDER — DIPHENHYDRAMINE HCL 25 MG PO CAPS: 25.0000 mg | ORAL_CAPSULE | Freq: Three times a day (TID) | ORAL | Status: DC | PRN

## 2015-04-19 MED ORDER — GABAPENTIN 400 MG PO CAPS
800.0000 mg | ORAL_CAPSULE | Freq: Four times a day (QID) | ORAL | Status: DC
  Administered 2015-04-19 – 2015-04-20 (×7): 800 mg via ORAL
  Filled 2015-04-19 (×7): qty 2

## 2015-04-19 MED ORDER — ONDANSETRON HCL 4 MG PO TABS: 4.0000 mg | ORAL_TABLET | Freq: Four times a day (QID) | ORAL | Status: DC | PRN

## 2015-04-19 MED ORDER — ATENOLOL 25 MG PO TABS
25.0000 mg | ORAL_TABLET | Freq: Every day | ORAL | Status: DC
  Administered 2015-04-20: 08:00:00 25 mg via ORAL
  Filled 2015-04-19: qty 1

## 2015-04-19 MED ORDER — IBUPROFEN 600 MG PO TABS
ORAL_TABLET | ORAL | Status: AC
  Filled 2015-04-19: qty 1

## 2015-04-19 MED ORDER — EXEMESTANE 25 MG PO TABS
25.0000 mg | ORAL_TABLET | Freq: Every day | ORAL | Status: DC
  Administered 2015-04-19 – 2015-04-20 (×2): 25 mg via ORAL
  Filled 2015-04-19 (×3): qty 1

## 2015-04-19 MED ORDER — ALBUTEROL SULFATE (2.5 MG/3ML) 0.083% IN NEBU: 3.0000 mL | INHALATION_SOLUTION | Freq: Four times a day (QID) | RESPIRATORY_TRACT | Status: DC | PRN

## 2015-04-19 MED ORDER — MORPHINE SULFATE (PF) 2 MG/ML IV SOLN
2.0000 mg | INTRAVENOUS | Status: DC | PRN
  Administered 2015-04-19 (×3): 2 mg via INTRAVENOUS
  Filled 2015-04-19 (×3): qty 1

## 2015-04-19 MED ORDER — ONDANSETRON HCL 4 MG PO TABS: 4.0000 mg | ORAL_TABLET | Freq: Three times a day (TID) | ORAL | Status: DC | PRN

## 2015-04-19 MED ORDER — PALBOCICLIB 125 MG PO CAPS: 125.0000 mg | ORAL_CAPSULE | Freq: Every day | ORAL | Status: DC

## 2015-04-19 MED ORDER — VANCOMYCIN HCL 500 MG IV SOLR
500.0000 mg | Freq: Two times a day (BID) | INTRAVENOUS | Status: DC
  Administered 2015-04-19 – 2015-04-20 (×2): 500 mg via INTRAVENOUS
  Filled 2015-04-19 (×4): qty 500

## 2015-04-19 MED ORDER — IBUPROFEN 600 MG PO TABS
600.0000 mg | ORAL_TABLET | Freq: Once | ORAL | Status: AC
  Administered 2015-04-19: 600 mg via ORAL

## 2015-04-19 MED ORDER — VANCOMYCIN HCL IN DEXTROSE 1-5 GM/200ML-% IV SOLN
1000.0000 mg | Freq: Once | INTRAVENOUS | Status: AC
Start: 2015-04-19 — End: 2015-04-19
  Administered 2015-04-19: 1000 mg via INTRAVENOUS
  Filled 2015-04-19: qty 200

## 2015-04-19 NOTE — ED Notes (Signed)
MD at bedside. 

## 2015-04-19 NOTE — Plan of Care (Signed)
Problem: Education: Goal: Knowledge of [facility] Education information/materials will improve Outcome: Progressing Patient admitted to room 103 with fever of unknown origin. Pt oriented to room, call light, and fall risk/safety. Pt is high fall risk with bed alarm activated. Port accessed per patients request. IVF infusing per MD order. Pink foam applied to patients sacral area due to redness with no breakdown. BL heel protectors placed on BL heels for redness. Pt verbalized understanding.   Problem: Safety: Goal: Ability to remain free from injury will improve Outcome: Progressing High fall risk with bed alarm activated. Pt has remained free of injury or fall this shift.     Problem: Health Behavior/Discharge Planning: Goal: Ability to manage health-related needs will improve Outcome: Progressing Care Management consult ordered. Pt has worsening lung cancer per report. Pt is aware per report from ED RN.      Problem: Pain Managment: Goal: General experience of comfort will improve Outcome: Progressing Pt rating abd pain 7/10 on arrival but pt resting with eyes closed. No signs or symptoms of pain or discomfort noted.     Problem: Skin Integrity: Goal: Risk for impaired skin integrity will decrease Outcome: Progressing Patient has dry flaky skin. BL heels are red, heel protectors applied for protection. Pink foam placed on sacral area for redness. Pt is able to turn self in bed but may need to be encouraged to reposition.      Problem: Bowel/Gastric: Goal: Will not experience complications related to bowel motility Outcome: Progressing Pt is unable to remember when her last BM was, will continue to monitor.

## 2015-04-19 NOTE — Progress Notes (Signed)
ANTIBIOTIC CONSULT NOTE - INITIAL  Pharmacy Consult for Aztreonam/vancomycin  Indication: pneumonia and rule out sepsis  Allergies  Allergen Reactions  . Penicillins Shortness Of Breath, Itching and Other (See Comments)    Has patient had a PCN reaction causing immediate rash, facial/tongue/throat swelling, SOB or lightheadedness with hypotension: Yes Has patient had a PCN reaction causing severe rash involving mucus membranes or skin necrosis: No Has patient had a PCN reaction that required hospitalization No Has patient had a PCN reaction occurring within the last 10 years: Yes If all of the above answers are "NO", then may proceed with Cephalosporin use.  . Ibuprofen Other (See Comments)    Reaction:  GI upset   . Prilosec [Omeprazole] Other (See Comments)    Reaction:  Involuntary muscle movements   . Tramadol Nausea Only  . Sulfa Antibiotics Itching and Rash    Patient Measurements: Weight: 128 lb 3.2 oz (58.151 kg) Adjusted Body Weight: 55.8 kg   Vital Signs: Temp: 102.5 F (39.2 C) (12/11 0121) Temp Source: Oral (12/11 0121) BP: 151/89 mmHg (12/11 0121) Pulse Rate: 116 (12/11 0121) Intake/Output from previous day:   Intake/Output from this shift:    Labs:  Recent Labs  04/19/15 0130  WBC 1.7*  HGB 10.0*  PLT 105*  CREATININE 0.59   Estimated Creatinine Clearance: 58.9 mL/min (by C-G formula based on Cr of 0.59). No results for input(s): VANCOTROUGH, VANCOPEAK, VANCORANDOM, GENTTROUGH, GENTPEAK, GENTRANDOM, TOBRATROUGH, TOBRAPEAK, TOBRARND, AMIKACINPEAK, AMIKACINTROU, AMIKACIN in the last 72 hours.   Microbiology: Recent Results (from the past 720 hour(s))  Culture, blood (routine x 2)     Status: None   Collection Time: 03/22/15  7:22 PM  Result Value Ref Range Status   Specimen Description BLOOD LEFT HAND  Final   Special Requests   Final    BOTTLES DRAWN AEROBIC AND ANAEROBIC 5 CC AEROBIC, 3 CC ANAEROBIC   Culture NO GROWTH 5 DAYS  Final   Report  Status 03/27/2015 FINAL  Final  Culture, blood (routine x 2)     Status: None   Collection Time: 03/22/15  7:22 PM  Result Value Ref Range Status   Specimen Description BLOOD LEFT ASSIST CONTROL  Final   Special Requests BOTTLES DRAWN AEROBIC AND ANAEROBIC 5 CC  Final   Culture NO GROWTH 5 DAYS  Final   Report Status 03/27/2015 FINAL  Final    Medical History: Past Medical History  Diagnosis Date  . Breast cancer metastasized to lung ([place]) 09/07/2014  . 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 Hypertension   . MRSA (methicillin resistant Staphylococcus aureus)   . COPD (chronic obstructive pulmonary disease) ([facility])     Medications:  Infusions:  . aztreonam 2 g (04/19/15 0211)  . sodium chloride 1,000 mL (04/19/15 0157)  . vancomycin     Assessment: 86 yof here via EMS for dizziness/shaking/SOB. PMH cardiac stents and lung cancer, starting aztreonam and vancomycin for PNA.   Vd 39.1 L, Ke 0.051 hr-1, T1/2 13.5 hr  Goal of Therapy:  Vancomycin trough level 15-20 mcg/ml  Plan:  Expected duration 7 days with resolution of temperature and/or normalization of WBC. Aztreonam 2 gm IV Q8H and vancomycin 1 gm IV x 1 (ED dose) followed by vancomycin 500 mg IV Q12H, predicted trough 16 mcg/mL. Pharmacy will continue to follow and adjust dose as needed to maintain trough 15 to 20 mcg/mL.  Laural Benes, Pharm.D., BCPS Clinical Pharmacist 04/19/2015,2:20 AM

## 2015-04-19 NOTE — ED Provider Notes (Addendum)
[facility] Emergency Department Provider Note  ____________________________________________  Time seen: Approximately 1:26 AM  I have reviewed the triage vital signs and the nursing notes.   HISTORY  Chief Complaint Fever    HPI Betty Edwards is a 69 y.o. female with a medical history that includes breast cancer with lung metastases who is actively undergoing chemotherapy as well as a history of MRSA infection and recent admission for cellulitis of the right lower extremity.  She also has hypertension and COPD.  She presents by EMS for acute onset of chest pain and worsening shortness of breath.  She was also found to be febrile to greater then 102.  He describes the pain as severe and not worsened or made better by anything particular although it is better now than it was initially.  It was acute in onset.  It feels like a sharp and stabbing pain.  She is also increasingly short of breath compared to usual but that too is eased off slightly.  He has had no nausea or vomiting.  She told the nurse he triaged her that she is having pain in her stomach down to her legs but she did not indicate any of this to me, just described the chest pain.   Past Medical History  Diagnosis Date  . Breast cancer metastasized to lung ([place]) 09/07/2014  . 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 Hypertension   . MRSA (methicillin resistant Staphylococcus aureus)   . COPD (chronic obstructive pulmonary disease) ([place])   . Asthma     Patient Active Problem List   Diagnosis Date Noted  . Hypokalemia 04/19/2015  . Cellulitis of right leg 03/22/2015  . Cellulitis 03/03/2015  . Carcinoma of breast metastatic to lung ([place]) 09/22/2014  . Fever 09/08/2014  . Fever presenting with conditions classified elsewhere 09/07/2014  . SIRS (systemic inflammatory response syndrome) ([place]) 09/07/2014  . Breast cancer metastasized to lung ([place]) 09/07/2014  . Hypertension  09/06/2014  . Arteriosclerosis of coronary artery 05/28/2013  . Chest pain 05/28/2013  . CAFL (chronic airflow limitation) ([place]) 05/28/2013  . Adult hypothyroidism 05/28/2013  . Affective disorder ([place]) 05/28/2013  . Drug abuse, opioid type 05/28/2013    Past Surgical History  Procedure Laterality Date  . Mastectomy      Current Outpatient Rx  Name  Route  Sig  Dispense  Refill  . albuterol (PROVENTIL HFA;VENTOLIN HFA) 108 (90 BASE) MCG/ACT inhaler   Inhalation   Inhale 2 puffs into the lungs every 6 (six) hours as needed for wheezing or shortness of breath.   1 Inhaler   2   . atenolol (TENORMIN) 25 MG tablet   Oral   Take 0.5 tablets (12.5 mg total) by mouth daily.   45 tablet   3   . busPIRone (BUSPAR) 10 MG tablet   Oral   Take 1 tablet (10 mg total) by mouth 3 (three) times daily.   90 tablet   6   . diphenhydrAMINE (BENADRYL) 25 MG tablet   Oral   Take 1 tablet (25 mg total) by mouth every 8 (eight) hours as needed for itching.   90 tablet   0   . DULoxetine (CYMBALTA) 30 MG capsule   Oral   Take 90 mg by mouth daily.         Marland Kitchen exemestane (AROMASIN) 25 MG tablet   Oral   Take 1 tablet (25 mg total) by mouth daily.   30 tablet  1   . Fluticasone-Salmeterol (ADVAIR) 250-50 MCG/DOSE AEPB   Inhalation   Inhale 1 puff into the lungs 2 (two) times daily as needed (for shortness of breath.).          Marland Kitchen gabapentin (NEURONTIN) 800 MG tablet   Oral   Take 800 mg by mouth 4 (four) times daily.          Leslee Home 125 MG capsule   Oral   Take 125 mg by mouth daily.           Dispense as written.   Marland Kitchen levothyroxine (SYNTHROID, LEVOTHROID) 50 MCG tablet   Oral   Take 1 tablet (50 mcg total) by mouth daily before breakfast.   30 tablet   3   . nicotine (NICODERM CQ - DOSED IN MG/24 HOURS) 14 mg/24hr patch   Transdermal   Place 1 patch (14 mg total) onto the skin daily.   28 patch   0   . ondansetron (ZOFRAN) 4 MG tablet   Oral   Take 1  tablet (4 mg total) by mouth every 8 (eight) hours as needed for nausea or vomiting.   60 tablet   2   . pantoprazole (PROTONIX) 40 MG tablet   Oral   Take 1 tablet (40 mg total) by mouth daily. Patient taking differently: Take 40 mg by mouth daily as needed (for acid reflux.).    90 tablet   3   . methadone (DOLOPHINE) 10 MG/ML solution   Oral   Take 117 mg by mouth daily.           Allergies Penicillins; Ibuprofen; Prilosec; Tramadol; and Sulfa antibiotics  Family History  Problem Relation Age of Onset  . Aneurysm Mother   . Diabetes Father     Social History Social History  Substance Use Topics  . Smoking status: Current Every Day Smoker -- 1.00 packs/day    Types: Cigarettes  . Smokeless tobacco: None     Comment: smoked for 46 years  . Alcohol Use: No    Review of Systems Constitutional: Fever to 102 while actively undergoing chemotherapy Eyes: No visual changes. ENT: No sore throat. Cardiovascular: Severe central chest pain Respiratory: Acute on chronic shortness of breath Gastrointestinal: No abdominal pain.  No nausea, no vomiting.  No diarrhea.  No constipation. Genitourinary: Negative for dysuria. Musculoskeletal: Negative for back pain. Skin: Negative for rash. Neurological: Negative for headaches, focal weakness or numbness.  10-point ROS otherwise negative.  ____________________________________________   PHYSICAL EXAM:  VITAL SIGNS: ED Triage Vitals  Enc Vitals Group     BP 04/19/15 0121 151/89 mmHg     Pulse Rate 04/19/15 0121 116     Resp 04/19/15 0121 19     Temp 04/19/15 0121 102.5 F (39.2 C)     Temp Source 04/19/15 0121 Oral     SpO2 04/19/15 0121 96 %     Weight --      Height --      Head Cir --      Peak Flow --      Pain Score 04/19/15 0124 8     Pain Loc --      Pain Edu? --      Excl. in [place]? --     Constitutional: Alert and oriented.  Has the appearance of severe chronic illness Eyes: Conjunctivae are normal.  PERRL. EOMI. Head: Atraumatic. Nose: No congestion/rhinnorhea. Mouth/Throat: Mucous membranes are moist.  Oropharynx non-erythematous. Neck: No stridor.  Cardiovascular: Tachycardia at about 1:30, regular rhythm. Grossly normal heart sounds.  Good peripheral circulation. Respiratory: Cough, slightly increased respiratory effort, but no wheezes rales or rhonchi at this time Gastrointestinal: Soft and nontender. No distention. No abdominal bruits. No CVA tenderness. Musculoskeletal: No lower extremity tenderness nor edema.  No joint effusions. Neurologic:  Normal speech and language. No gross focal neurologic deficits are appreciated.  Skin:  Skin is pale, febrile, dry and intact.  Skin darkening on the right lower extremity consistent with her history of recent cellulitis but no sign of acute infection Psychiatric: Mood and affect are normal. Speech and behavior are normal.  ____________________________________________   LABS (all labs ordered are listed, but only abnormal results are displayed)  Labs Reviewed  COMPREHENSIVE METABOLIC PANEL - Abnormal; Notable for the following:    Potassium 3.2 (*)    Calcium 8.5 (*)    Total Protein 6.3 (*)    Albumin 3.3 (*)    ALT 11 (*)    All other components within normal limits  CBC WITH DIFFERENTIAL/PLATELET - Abnormal; Notable for the following:    WBC 1.7 (*)    RBC 3.38 (*)    Hemoglobin 10.0 (*)    HCT 31.8 (*)    MCHC 31.3 (*)    RDW 21.9 (*)    Platelets 105 (*)    Neutro Abs 1.2 (*)    Lymphs Abs 0.4 (*)    Monocytes Absolute 0.1 (*)    All other components within normal limits  PROTIME-INR - Abnormal; Notable for the following:    Prothrombin Time 15.1 (*)    All other components within normal limits  URINALYSIS COMPLETEWITH MICROSCOPIC ([facility] ONLY) - Abnormal; Notable for the following:    Color, Urine YELLOW (*)    APPearance CLEAR (*)    Specific Gravity, Urine 1.040 (*)    Squamous Epithelial / LPF 0-5 (*)    All  other components within normal limits  CULTURE, BLOOD (ROUTINE X 2)  CULTURE, BLOOD (ROUTINE X 2)  URINE CULTURE  LACTIC ACID, PLASMA  LIPASE, BLOOD  TROPONIN I  APTT  LACTIC ACID, PLASMA   ____________________________________________  EKG  ED ECG REPORT I, Yolander Goodie, the attending physician, personally viewed and interpreted this ECG.  Date: 04/19/2015 EKG Time: 1:21 Rate: 120 Rhythm: Sinus tachycardia QRS Axis: normal Intervals: normal ST/T Wave abnormalities: Non-specific ST segment / T-wave changes, but no evidence of acute ischemia. Conduction Disutrbances: none Narrative Interpretation: artifact present, generally unremarkable  ____________________________________________  RADIOLOGY   Ct Angio Chest Pe W/cm &/or Wo Cm  04/19/2015  CLINICAL DATA:  Acute onset of dizziness, shakiness and shortness of breath. Pain radiates down both legs. Initial encounter. EXAM: CT ANGIOGRAPHY CHEST WITH CONTRAST TECHNIQUE: Multidetector CT imaging of the chest was performed using the standard protocol during bolus administration of intravenous contrast. Multiplanar CT image reconstructions and MIPs were obtained to evaluate the vascular anatomy. CONTRAST:  96mL OMNIPAQUE IOHEXOL 350 MG/ML SOLN COMPARISON:  Chest radiograph performed earlier today at 1:40 a.m., and CT of the chest performed 12/19/2014 FINDINGS: There is no evidence of pulmonary embolus. Multiple masses at the right lung base have increased in size, the largest now measuring 5.2 cm. Mild adjacent atelectasis is noted. A trace likely malignant right-sided pleural effusion is seen. Findings are compatible with worsening malignancy. No definite pneumonia is seen. Mild peripheral scarring is noted within both lungs. No suspicious nodules are noted on the left side. No pneumothorax is seen. Diffuse coronary artery  calcifications are noted. A moderate hiatal hernia is noted. A 2.2 cm right paratracheal node raises suspicion for  metastatic disease. No pericardial effusion is identified. The great vessels are grossly unremarkable in appearance. No axillary lymphadenopathy is seen. The thyroid gland is unremarkable in appearance. A right-sided chest port is noted ending about the cavoatrial junction. The patient is status post right-sided mastectomy. A few small epicardial fat pad nodes are nonspecific, measuring up to 7 mm. The visualized portions of the liver and spleen are unremarkable. The patient is status post cholecystectomy, with clips noted along the gallbladder fossa. The visualized portions of the pancreas, adrenal glands and kidneys are grossly unremarkable, though difficult to fully assess due to motion artifact. The colon is partially filled with stool. Scattered calcification is noted along the proximal abdominal aorta and its branches. No acute osseous abnormalities are seen. Degenerative change is noted at the right glenohumeral joint. Review of the MIP images confirms the above findings. IMPRESSION: 1. No evidence of pulmonary embolus. 2. Multiple masses at the right lung base have increased in size, the largest measuring 5.2 cm. Mild adjacent atelectasis noted. Trace likely malignant right-sided pleural effusion seen. Findings are compatible with worsening malignancy. No evidence of pneumonia at this time. 3. 2.2 cm right paratracheal node raises suspicion for metastatic disease. 4. Mild peripheral scarring noted within both lungs. 5. Diffuse coronary artery calcifications seen. 6. Moderate hiatal hernia noted. 7. Scattered calcification along the proximal abdominal aorta and its branches. 8. Degenerative change at the right glenohumeral joint. Electronically Signed   By: Garald Balding M.D.   On: 04/19/2015 03:21   Dg Chest Port 1 View  04/19/2015  CLINICAL DATA:  Acute onset of shortness of breath, dizziness and shakiness. Pain radiates down both legs. Initial encounter. EXAM: PORTABLE CHEST 1 VIEW COMPARISON:  Chest  radiograph performed 03/22/2015 FINDINGS: There is elevation of the right hemidiaphragm. 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 Superimposed pneumonia cannot be excluded. Mild vascular congestion is seen. No definite pleural effusion or pneumothorax is identified. The cardiomediastinal silhouette is mildly enlarged. No acute osseous abnormalities are seen. Sclerotic foci within the left humerus are nonspecific. IMPRESSION: Elevation of the right hemidiaphragm. Right basilar lung cancer again noted. Superimposed pneumonia cannot be excluded. Mild vascular congestion and mild cardiomegaly noted. Electronically Signed   By: Garald Balding M.D.   On: 04/19/2015 02:16    ____________________________________________   PROCEDURES  Procedure(s) performed: None  Critical Care performed: Yes, see critical care note(s)   CRITICAL CARE Performed by: Hinda Kehr   Total critical care time: 45 minutes  Critical care time was exclusive of separately billable procedures and treating other patients.  Critical care was necessary to treat or prevent imminent or life-threatening deterioration.  Critical care was time spent personally by me on the following activities: development of treatment plan with patient and/or surrogate as well as nursing, discussions with consultants, evaluation of patient's response to treatment, examination of patient, obtaining history from patient or surrogate, ordering and performing treatments and interventions, ordering and review of laboratory studies, ordering and review of radiographic studies, pulse oximetry and re-evaluation of patient's condition.  ____________________________________________   INITIAL IMPRESSION / ASSESSMENT AND PLAN / ED COURSE  Pertinent labs & imaging results that were available during my care of the patient were reviewed by me and considered in my medical decision making (see chart for details).  The sepsis criteria based on  tachycardia and fever, particularly in the setting of  active hemotherapy.  I initiated the sepsis protocol including 30 mL/kg of normal saline and empiric antibiotics for presumed healthcare associated pneumonia.  Given her tachycardia and pleuritic chest pain and active cancer, I am also going to obtain a CT angios chest to rule out PE if her creatinine is appropriate.  ----------------------------------------- 2:39 AM on 04/19/2015 -----------------------------------------  Stable, awaiting CT results.  ----------------------------------------- 3:37 AM on 04/19/2015 -----------------------------------------  Still febrile 2102 and 103, giving another dose of Tylenol.  No evidence of PE on the CT scan but the patient has worsening malignancy.  No sign of pneumonia right now.  They are currently obtaining a urine sample for urinalysis but the patient has are received antibiotics empirically.  I spoke with the hospitalist who will admit. ____________________________________________  FINAL CLINICAL IMPRESSION(S) / ED DIAGNOSES  Final diagnoses:  Sepsis, due to unspecified organism ([place])  Fever, unspecified fever cause  Chest pain, unspecified chest pain type  Shortness of breath      NEW MEDICATIONS STARTED DURING THIS VISIT:  New Prescriptions   No medications on file     Hinda Kehr, MD 04/19/15 [place], MD 04/19/15 [id number]

## 2015-04-19 NOTE — Progress Notes (Signed)
[facility] [place] NAME: Betty Edwards    MR#:  [id number]  DATE OF BIRTH:  [birth year]  SUBJECTIVE: This 69 year old female patient admitted for pneumonia. Right and 103 Fahrenheit at home. Has history of metastatic breast cancer with multiple chemotherapies. she says she is feeling better today.   CHIEF COMPLAINT:   Chief Complaint  Patient presents with  . Fever    REVIEW OF SYSTEMS:   ROS CONSTITUTIONAL: No fever, fatigue or weakness.  EYES: No blurred or double vision.  EARS, NOSE, AND THROAT: No tinnitus or ear pain.  RESPIRATORY, shortness of breath. No cough. CARDIOVASCULAR: No chest pain, orthopnea, edema.  GASTROINTESTINAL: No nausea, vomiting, diarrhea or abdominal pain.  GENITOURINARY: No dysuria, hematuria.  ENDOCRINE: No polyuria, nocturia,  HEMATOLOGY: No anemia, easy bruising or bleeding SKIN: No rash or lesion. MUSCULOSKELETAL: No joint pain or arthritis.   NEUROLOGIC: No tingling, numbness, weakness.  PSYCHIATRY: No anxiety or depression.   DRUG ALLERGIES:   Allergies  Allergen Reactions  . Penicillins Shortness Of Breath, Itching and Other (See Comments)    Has patient had a PCN reaction causing immediate rash, facial/tongue/throat swelling, SOB or lightheadedness with hypotension: Yes Has patient had a PCN reaction causing severe rash involving mucus membranes or skin necrosis: No Has patient had a PCN reaction that required hospitalization No Has patient had a PCN reaction occurring within the last 10 years: Yes If all of the above answers are "NO", then may proceed with Cephalosporin use.  . Ibuprofen Other (See Comments)    Reaction:  GI upset   . Prilosec [Omeprazole] Other (See Comments)    Reaction:  Involuntary muscle movements   . Tramadol Nausea Only  . Sulfa Antibiotics Itching and Rash    VITALS:  Blood pressure 134/104, pulse 113, temperature 98.7 F (37.1 C), temperature source Oral,  resp. rate 18, weight 58.151 kg (128 lb 3.2 oz), SpO2 95 %.  PHYSICAL EXAMINATION:  GENERAL:  69 y.o.-year-old patient lying in the bed with no acute distress.  EYES: Pupils equal, round, reactive to light and accommodation. No scleral icterus. Extraocular muscles intact.  HEENT: Head atraumatic, normocephalic. Oropharynx and nasopharynx clear.  NECK:  Supple, no jugular venous distention. No thyroid enlargement, no tenderness.  LUNGS: Decreased breath sounds on the right side. no wheezing, rales,rhonchi or crepitation. No use of accessory muscles of respiration.  CARDIOVASCULAR: S1, S2 normal. No murmurs, rubs, or gallops.  ABDOMEN: Soft, nontender, nondistended. Bowel sounds present. No organomegaly or mass.  EXTREMITIES: No pedal edema, cyanosis, or clubbing.  NEUROLOGIC: Cranial nerves II through XII are intact. Muscle strength 5/5 in all extremities. Sensation intact. Gait not checked.  PSYCHIATRIC: The patient is alert and oriented x 3.  SKIN: No obvious rash, lesion, or ulcer.    LABORATORY PANEL:   CBC  Recent Labs Lab 04/19/15 0557  WBC 2.2*  HGB 8.2*  HCT 26.6*  PLT 81*   ------------------------------------------------------------------------------------------------------------------  Chemistries   Recent Labs Lab 04/19/15 0130 04/19/15 0557  NA 140 138  K 3.2* 3.7  CL 108 110  CO2 26 25  GLUCOSE 82 102*  BUN 13 10  CREATININE 0.59 0.65  CALCIUM 8.5* 7.6*  AST 20  --   ALT 11*  --   ALKPHOS 68  --   BILITOT 0.6  --    ------------------------------------------------------------------------------------------------------------------  Cardiac Enzymes  Recent Labs Lab 04/19/15 0130  TROPONINI 0.03   ------------------------------------------------------------------------------------------------------------------  RADIOLOGY:  Ct Angio  Chest Pe W/cm &/or Wo Cm  04/19/2015  CLINICAL DATA:  Acute onset of dizziness, shakiness and shortness of breath.  Pain radiates down both legs. Initial encounter. EXAM: CT ANGIOGRAPHY CHEST WITH CONTRAST TECHNIQUE: Multidetector CT imaging of the chest was performed using the standard protocol during bolus administration of intravenous contrast. Multiplanar CT image reconstructions and MIPs were obtained to evaluate the vascular anatomy. CONTRAST:  56mL OMNIPAQUE IOHEXOL 350 MG/ML SOLN COMPARISON:  Chest radiograph performed earlier today at 1:40 a.m., and CT of the chest performed 12/19/2014 FINDINGS: There is no evidence of pulmonary embolus. Multiple masses at the right lung base have increased in size, the largest now measuring 5.2 cm. Mild adjacent atelectasis is noted. A trace likely malignant right-sided pleural effusion is seen. Findings are compatible with worsening malignancy. No definite pneumonia is seen. Mild peripheral scarring is noted within both lungs. No suspicious nodules are noted on the left side. No pneumothorax is seen. Diffuse coronary artery calcifications are noted. A moderate hiatal hernia is noted. A 2.2 cm right paratracheal node raises suspicion for metastatic disease. No pericardial effusion is identified. The great vessels are grossly unremarkable in appearance. No axillary lymphadenopathy is seen. The thyroid gland is unremarkable in appearance. A right-sided chest port is noted ending about the cavoatrial junction. The patient is status post right-sided mastectomy. A few small epicardial fat pad nodes are nonspecific, measuring up to 7 mm. The visualized portions of the liver and spleen are unremarkable. The patient is status post cholecystectomy, with clips noted along the gallbladder fossa. The visualized portions of the pancreas, adrenal glands and kidneys are grossly unremarkable, though difficult to fully assess due to motion artifact. The colon is partially filled with stool. Scattered calcification is noted along the proximal abdominal aorta and its branches. No acute osseous  abnormalities are seen. Degenerative change is noted at the right glenohumeral joint. Review of the MIP images confirms the above findings. IMPRESSION: 1. No evidence of pulmonary embolus. 2. Multiple masses at the right lung base have increased in size, the largest measuring 5.2 cm. Mild adjacent atelectasis noted. Trace likely malignant right-sided pleural effusion seen. Findings are compatible with worsening malignancy. No evidence of pneumonia at this time. 3. 2.2 cm right paratracheal node raises suspicion for metastatic disease. 4. Mild peripheral scarring noted within both lungs. 5. Diffuse coronary artery calcifications seen. 6. Moderate hiatal hernia noted. 7. Scattered calcification along the proximal abdominal aorta and its branches. 8. Degenerative change at the right glenohumeral joint. Electronically Signed   By: Garald Balding M.D.   On: 04/19/2015 03:21   Dg Chest Port 1 View  04/19/2015  CLINICAL DATA:  Acute onset of shortness of breath, dizziness and shakiness. Pain radiates down both legs. Initial encounter. EXAM: PORTABLE CHEST 1 VIEW COMPARISON:  Chest radiograph performed 03/22/2015 FINDINGS: There is elevation of the right hemidiaphragm. 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 Superimposed pneumonia cannot be excluded. Mild vascular congestion is seen. No definite pleural effusion or pneumothorax is identified. The cardiomediastinal silhouette is mildly enlarged. No acute osseous abnormalities are seen. Sclerotic foci within the left humerus are nonspecific. IMPRESSION: Elevation of the right hemidiaphragm. Right basilar lung cancer again noted. Superimposed pneumonia cannot be excluded. Mild vascular congestion and mild cardiomegaly noted. Electronically Signed   By: Garald Balding M.D.   On: 04/19/2015 02:16    EKG:   Orders placed or performed during the hospital encounter of 04/19/15  . EKG 12-Lead  . EKG 12-Lead  .  EKG 12-Lead  . EKG 12-Lead    ASSESSMENT  AND PLAN:  #1 sepsis present on admission secondary to pneumonia: On Vanco and Azactam. Continue them.  if afebrile for the next 24 hours will change to Levaquin. Follow blood cultures.  #2 metastatic breast cancer getting the chemotherapy at home. CT chest   Showed progression  of the disease follow up with oncology tomorrow  #3 .history of hypertension stable History of COPD stable History of neuropathy and continue Neurontin. History of depression continue Cymbalta. Chronic pain syndrome continue methadone   hypothyroidism continue Synthroid.   All the records are reviewed and case discussed with Care Management/Social Workerr. Management plans discussed with the patient, family and they are in agreement.  CODE STATUS: Full  TOTAL TIME TAKING CARE OF THIS PATIENT; 35 minutes.   POSSIBLE D/C IN 1-2 DAYS, DEPENDING ON CLINICAL CONDITION.   Epifanio Lesches M.D on 04/19/2015 at 12:29 PM  Between 7am to 6pm - Pager - [phone]  After 6pm go to [email or web address] - password [email or web address] [facility]  Office  [phone]  CC: Primary care physician; [name], MD   Note: This dictation was prepared with Dragon dictation along with smaller phrase technology. Any transcriptional errors that result from this process are unintentional.

## 2015-04-19 NOTE — ED Notes (Signed)
Per EMS pt called them and was dizzy, shaky, and SOB.  Pt denies falling or losing consciousness.  She presents tonight A&Ox4 asking for a Pepsi.  She also states she is in pain from hr stomach and pain is radiating down both legs.  She rates her pain at 8/10.  Per EMS pt has hx of cardiac stents and lung ca.

## 2015-04-19 NOTE — H&P (Signed)
[facility] NAME: Betty Edwards    MR#:  [id number]  DATE OF BIRTH:  [birth year]  DATE OF ADMISSION:  04/19/2015  PRIMARY CARE PHYSICIAN: [name], MD   REQUESTING/REFERRING PHYSICIAN: Karma Greaser  CHIEF COMPLAINT:   Chief Complaint  Patient presents with  . Fever    HISTORY OF PRESENT ILLNESS:  Betty Edwards  is a 69 y.o. female with a known history of breast cancer metastatic to lung undergoing chemotherapy, bronchial asthma/COPD, hypertension, hypothyroidism, GAD, history of opioid drug use presents with the complaints of fever of 10 9F of one day duration. Does complain of nasal congestion but denies any cough, hemoptysis. Also complains of increasing shortness of breath with chest discomfort. Denies any palpitations, dizziness, nausea, vomiting, diarrhea, abdominal pain, dysuria.  On arrival to the ED, patient was noted to have a temperature of 102.64F and heart rate of 1 20 bpm with respiratory rate of 90 bpm. Lab work revealed WBC 1.7, H&H 10.0/31.8, potassium 3.2, troponin 0.03. Urinalysis unremarkable. Chest x-ray right lung cancer? Superimposed pneumonia. CT angiogram of the chest negative for pulmonary embolism and negative for infiltrate/pneumonia but showed progression of multiple lung masses. EKG sinus tachycardia with ventricular rate of 99 bpm, nonspecific ST-T abnormality.  Patient received 2 L of normal saline bolus, IV morphine and placed on O2 supplementation. After obtaining blood and urine cultures patient was started on IV antibiotics- Aztreonam and vancomycin. Hospitalist service was consulted for further management.  At the current time patient is resting in the bed, states chest pain is under control.  PAST MEDICAL HISTORY:   Past Medical History  Diagnosis Date  . Breast cancer metastasized to lung ([place]) 09/07/2014  . Carcinoma of breast metastatic to lung ([place]) 09/22/2014  . Hypertension   . MRSA  (methicillin resistant Staphylococcus aureus)   . COPD (chronic obstructive pulmonary disease) ([place])   . Asthma     PAST SURGICAL HISTORY:   Past Surgical History  Procedure Laterality Date  . Mastectomy    . Cholecystectomy    . Cesarean section      SOCIAL HISTORY:   Social History  Substance Use Topics  . Smoking status: Current Every Day Smoker -- 1.00 packs/day    Types: Cigarettes  . Smokeless tobacco: Not on file     Comment: smoked for 46 years  . Alcohol Use: No    FAMILY HISTORY:   Family History  Problem Relation Age of Onset  . Aneurysm Mother   . Diabetes Father     DRUG ALLERGIES:   Allergies  Allergen Reactions  . Penicillins Shortness Of Breath, Itching and Other (See Comments)    Has patient had a PCN reaction causing immediate rash, facial/tongue/throat swelling, SOB or lightheadedness with hypotension: Yes Has patient had a PCN reaction causing severe rash involving mucus membranes or skin necrosis: No Has patient had a PCN reaction that required hospitalization No Has patient had a PCN reaction occurring within the last 10 years: Yes If all of the above answers are "NO", then may proceed with Cephalosporin use.  . Ibuprofen Other (See Comments)    Reaction:  GI upset   . Prilosec [Omeprazole] Other (See Comments)    Reaction:  Involuntary muscle movements   . Tramadol Nausea Only  . Sulfa Antibiotics Itching and Rash    REVIEW OF SYSTEMS:   Review of Systems  Constitutional: Positive for fever and malaise/fatigue. Negative for chills.  HENT: Negative  for ear pain, hearing loss, nosebleeds, sore throat and tinnitus.   Eyes: Negative for blurred vision, double vision, pain, discharge and redness.  Respiratory: Positive for shortness of breath. Negative for cough, hemoptysis, sputum production and wheezing.   Cardiovascular: Positive for chest pain. Negative for palpitations, orthopnea and leg swelling.  Gastrointestinal: Negative for  nausea, vomiting, abdominal pain, diarrhea, constipation, blood in stool and melena.  Genitourinary: Negative for dysuria, urgency, frequency and hematuria.  Musculoskeletal: Negative for back pain, joint pain and neck pain.  Skin: Negative for itching and rash.  Neurological: Negative for dizziness, tingling, sensory change, focal weakness and seizures.  Endo/Heme/Allergies: Does not bruise/bleed easily.  Psychiatric/Behavioral: Negative for depression. The patient is not nervous/anxious.     MEDICATIONS AT HOME:   Prior to Admission medications   Medication Sig Start Date End Date Taking? Authorizing Provider  albuterol (PROVENTIL HFA;VENTOLIN HFA) 108 (90 BASE) MCG/ACT inhaler Inhale 2 puffs into the lungs every 6 (six) hours as needed for wheezing or shortness of breath. 04/13/15  Yes Cammie Sickle, MD  atenolol (TENORMIN) 25 MG tablet Take 0.5 tablets (12.5 mg total) by mouth daily. 12/12/14  Yes Arlis Porta., MD  busPIRone (BUSPAR) 10 MG tablet Take 1 tablet (10 mg total) by mouth 3 (three) times daily. 02/20/15  Yes Arlis Porta., MD  diphenhydrAMINE (BENADRYL) 25 MG tablet Take 1 tablet (25 mg total) by mouth every 8 (eight) hours as needed for itching. 02/09/15  Yes Leia Alf, MD  DULoxetine (CYMBALTA) 30 MG capsule Take 90 mg by mouth daily.   Yes Historical Provider, MD  exemestane (AROMASIN) 25 MG tablet Take 1 tablet (25 mg total) by mouth daily. 03/17/15  Yes Cammie Sickle, MD  Fluticasone-Salmeterol (ADVAIR) 250-50 MCG/DOSE AEPB Inhale 1 puff into the lungs 2 (two) times daily as needed (for shortness of breath.).    Yes Historical Provider, MD  gabapentin (NEURONTIN) 800 MG tablet Take 800 mg by mouth 4 (four) times daily.    Yes Historical Provider, MD  IBRANCE 125 MG capsule Take 125 mg by mouth daily. 02/16/15  Yes Historical Provider, MD  levothyroxine (SYNTHROID, LEVOTHROID) 50 MCG tablet Take 1 tablet (50 mcg total) by mouth daily before  breakfast. 02/16/15  Yes Arlis Porta., MD  methadone (DOLOPHINE) 10 MG/ML solution Take 117 mg by mouth daily.   Yes Historical Provider, MD  nicotine (NICODERM CQ - DOSED IN MG/24 HOURS) 14 mg/24hr patch Place 1 patch (14 mg total) onto the skin daily. 03/23/15  Yes Loletha Grayer, MD  ondansetron (ZOFRAN) 4 MG tablet Take 1 tablet (4 mg total) by mouth every 8 (eight) hours as needed for nausea or vomiting. 01/23/15  Yes Leia Alf, MD  pantoprazole (PROTONIX) 40 MG tablet Take 1 tablet (40 mg total) by mouth daily. Patient taking differently: Take 40 mg by mouth daily as needed (for acid reflux.).  12/12/14  Yes Arlis Porta., MD      VITAL SIGNS:  Blood pressure 105/81, pulse 118, temperature 101.3 F (38.5 C), temperature source Oral, resp. rate 18, weight 58.151 kg (128 lb 3.2 oz), SpO2 94 %.  PHYSICAL EXAMINATION:  Physical Exam  Constitutional: She is oriented to person, place, and time. She appears well-developed and well-nourished. She appears distressed (mild distress +).  HENT:  Head: Normocephalic and atraumatic.  Right Ear: External ear normal.  Left Ear: External ear normal.  Nose: Nose normal.  Mouth/Throat: Oropharynx is clear and moist. No  oropharyngeal exudate.  Eyes: EOM are normal. Pupils are equal, round, and reactive to light. No scleral icterus.  Neck: Normal range of motion. Neck supple. No JVD present. No thyromegaly present.  Cardiovascular: Normal rate, regular rhythm, normal heart sounds and intact distal pulses.  Exam reveals no friction rub.   No murmur heard. Respiratory: Effort normal and breath sounds normal. No respiratory distress. She has no wheezes. She has no rales. She exhibits no tenderness.  GI: Soft. Bowel sounds are normal. She exhibits no distension and no mass. There is no tenderness. There is no rebound and no guarding.  Musculoskeletal: She exhibits no edema.  Decreased range of motion of right knee with deformity +   Lymphadenopathy:    She has no cervical adenopathy.  Neurological: She is alert and oriented to person, place, and time. She has normal reflexes. She displays normal reflexes. No cranial nerve deficit. She exhibits normal muscle tone.  Skin: Skin is warm. No rash noted. No erythema.  Psychiatric: She has a normal mood and affect. Her behavior is normal. Thought content normal.   LABORATORY PANEL:   CBC  Recent Labs Lab 04/19/15 0130  WBC 1.7*  HGB 10.0*  HCT 31.8*  PLT 105*   ------------------------------------------------------------------------------------------------------------------  Chemistries   Recent Labs Lab 04/19/15 0130  NA 140  K 3.2*  CL 108  CO2 26  GLUCOSE 82  BUN 13  CREATININE 0.59  CALCIUM 8.5*  AST 20  ALT 11*  ALKPHOS 68  BILITOT 0.6   ------------------------------------------------------------------------------------------------------------------  Cardiac Enzymes  Recent Labs Lab 04/19/15 0130  TROPONINI 0.03   ------------------------------------------------------------------------------------------------------------------  RADIOLOGY:  Ct Angio Chest Pe W/cm &/or Wo Cm  04/19/2015  CLINICAL DATA:  Acute onset of dizziness, shakiness and shortness of breath. Pain radiates down both legs. Initial encounter. EXAM: CT ANGIOGRAPHY CHEST WITH CONTRAST TECHNIQUE: Multidetector CT imaging of the chest was performed using the standard protocol during bolus administration of intravenous contrast. Multiplanar CT image reconstructions and MIPs were obtained to evaluate the vascular anatomy. CONTRAST:  29mL OMNIPAQUE IOHEXOL 350 MG/ML SOLN COMPARISON:  Chest radiograph performed earlier today at 1:40 a.m., and CT of the chest performed 12/19/2014 FINDINGS: There is no evidence of pulmonary embolus. Multiple masses at the right lung base have increased in size, the largest now measuring 5.2 cm. Mild adjacent atelectasis is noted. A trace likely  malignant right-sided pleural effusion is seen. Findings are compatible with worsening malignancy. No definite pneumonia is seen. Mild peripheral scarring is noted within both lungs. No suspicious nodules are noted on the left side. No pneumothorax is seen. Diffuse coronary artery calcifications are noted. A moderate hiatal hernia is noted. A 2.2 cm right paratracheal node raises suspicion for metastatic disease. No pericardial effusion is identified. The great vessels are grossly unremarkable in appearance. No axillary lymphadenopathy is seen. The thyroid gland is unremarkable in appearance. A right-sided chest port is noted ending about the cavoatrial junction. The patient is status post right-sided mastectomy. A few small epicardial fat pad nodes are nonspecific, measuring up to 7 mm. The visualized portions of the liver and spleen are unremarkable. The patient is status post cholecystectomy, with clips noted along the gallbladder fossa. The visualized portions of the pancreas, adrenal glands and kidneys are grossly unremarkable, though difficult to fully assess due to motion artifact. The colon is partially filled with stool. Scattered calcification is noted along the proximal abdominal aorta and its branches. No acute osseous abnormalities are seen. Degenerative change is noted at  the right glenohumeral joint. Review of the MIP images confirms the above findings. IMPRESSION: 1. No evidence of pulmonary embolus. 2. Multiple masses at the right lung base have increased in size, the largest measuring 5.2 cm. Mild adjacent atelectasis noted. Trace likely malignant right-sided pleural effusion seen. Findings are compatible with worsening malignancy. No evidence of pneumonia at this time. 3. 2.2 cm right paratracheal node raises suspicion for metastatic disease. 4. Mild peripheral scarring noted within both lungs. 5. Diffuse coronary artery calcifications seen. 6. Moderate hiatal hernia noted. 7. Scattered  calcification along the proximal abdominal aorta and its branches. 8. Degenerative change at the right glenohumeral joint. Electronically Signed   By: Garald Balding M.D.   On: 04/19/2015 03:21   Dg Chest Port 1 View  04/19/2015  CLINICAL DATA:  Acute onset of shortness of breath, dizziness and shakiness. Pain radiates down both legs. Initial encounter. EXAM: PORTABLE CHEST 1 VIEW COMPARISON:  Chest radiograph performed 03/22/2015 FINDINGS: There is elevation of the right hemidiaphragm. 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 Superimposed pneumonia cannot be excluded. Mild vascular congestion is seen. No definite pleural effusion or pneumothorax is identified. The cardiomediastinal silhouette is mildly enlarged. No acute osseous abnormalities are seen. Sclerotic foci within the left humerus are nonspecific. IMPRESSION: Elevation of the right hemidiaphragm. Right basilar lung cancer again noted. Superimposed pneumonia cannot be excluded. Mild vascular congestion and mild cardiomegaly noted. Electronically Signed   By: Garald Balding M.D.   On: 04/19/2015 02:16    EKG:   Orders placed or performed during the hospital encounter of 04/19/15  . EKG 12-Lead sinus tachycardia with ventricular rate of 99 bpm, ST-T wave abnormality. Poor baseline   . EKG 12-Lead  . EKG 12-Lead  . EKG 12-Lead    IMPRESSION AND PLAN:   70 year old female with history of metastatic breast cancer undergoing chemotherapy presents with the complaints of one day history of fever of 102.34F.  1. Fever in a patient with history of metastatic breast cancer undergoing active chemotherapy. Patient tachycardic and tachypneic with a temperature of 102.34F. WBC 1.7. Chest x-ray worsening of multiple lung masses with? Superimposed pneumonia. CT angios chest negative for pulmonary embolism and negative for pneumonia but positive for worsening of multiple lung masses. Plan: Admit, continue IV antibiotics-vancomycin and  aztreonam. Follow-up CBC, blood and urine cultures. 2. Metastatic breast cancer, known to oncology service, undergoing chemotherapy. Plan: Continue current meds. Oncology consultation requested. 3. Hypokalemia, mild. Potassium supplementation, follow-up BMP. 4. Hypertension, stable on home medications. Continue same. 5. Bronchial asthma/COPD, stable on home medications. No acute problems. Continue home medications. 6. Hypothyroidism, stable on home medications. Continue same. 7. GAD/depression, stable on home medications. Continue same. 8. History of opioid drug abuse, on methadone. Continue same.   All the records are reviewed and case discussed with ED provider. Management plans discussed with the patient, and  in agreement.  CODE STATUS: Full code.  DVT prophylaxis: Lovenox GI prophylaxis: PPI  TOTAL TIME TAKING CARE OF THIS PATIENT: 50 minutes.    Juluis Mire M.D on 04/19/2015 at 4:30 AM  Between 7am to 6pm - Pager - [phone]  After 6pm go to [email or web address] - password [email or web address] [facility]  Office  [phone]  CC: Primary care physician; [name], MD

## 2015-04-19 NOTE — Consult Note (Signed)
[place] @ [facility] [phone]  Fax:([phone] [place]: July 21, 1945  MR#: [id number]  WER#:[id number]  Patient Care Team: Arlis Porta., MD as PCP - General (Family Medicine)  CHIEF COMPLAINT:  Chief Complaint  Patient presents with  . Fever     No history exists.   patient with metastatic breast cancer, status post multiple chemotherapies, most recently treated with Aromasin and palbociclib, admitted with fevers of 10 35F last night.   Oncology Flowsheet 11/12/2014 11/19/2014 12/03/2014 03/03/2015 03/22/2015 03/23/2015 04/19/2015  Day, Cycle Day 8, CYCLE 3 Day 15, CYCLE 3 Day 1, Cycle 4 - - - -  ALPRAZolam (XANAX) PO - - - - 0.25 mg 0.25 mg -  dexamethasone (DECADRON) IV [ 10 mg ] [ 10 mg ] [ 10 mg ] - - - -  diphenhydrAMINE (BENADRYL) PO - - - - - - -  enoxaparin (LOVENOX) [place] - - - 40 mg - - -  exemestane (AROMASIN) PO - - - - - 25 mg -  LORazepam (ATIVAN) IV - - - 2 mg - - -  ondansetron (ZOFRAN) IV [ 8 mg ] [ 8 mg ] [ 8 mg ] - - - 4 mg  vinorelbine (NAVELBINE) IV 20 mg/m2 20 mg/m2 20 mg/m2 - - - -    HISTORY OF PRESENT ILLNESS:   Patient developed fevers last night, without any additional symptoms, such as upper respiratory signs, cough, chest pain, nausea, vomiting, diarrhea, bleeding, dysuria. She denies sick contacts.  REVIEW OF SYSTEMS:   Review of Systems  All other systems reviewed and are negative.    PAST MEDICAL HISTORY: Past Medical History  Diagnosis Date  . Breast cancer metastasized to lung ([place]) 09/07/2014  . Carcinoma of breast metastatic to lung ([place]) 09/22/2014  . Hypertension   . MRSA (methicillin resistant Staphylococcus aureus)   . COPD (chronic obstructive pulmonary disease) ([place])   . Asthma     PAST SURGICAL HISTORY: Past Surgical History  Procedure Laterality Date  . Mastectomy    . Cholecystectomy    . Cesarean section      FAMILY HISTORY Family History  Problem Relation Age of Onset  . Aneurysm  Mother   . Diabetes Father     ADVANCED DIRECTIVES:  No flowsheet data found.  HEALTH MAINTENANCE: Social History  Substance Use Topics  . Smoking status: Current Every Day Smoker -- 1.00 packs/day    Types: Cigarettes  . Smokeless tobacco: None     Comment: smoked for 46 years  . Alcohol Use: No     Allergies  Allergen Reactions  . Penicillins Shortness Of Breath, Itching and Other (See Comments)    Has patient had a PCN reaction causing immediate rash, facial/tongue/throat swelling, SOB or lightheadedness with hypotension: Yes Has patient had a PCN reaction causing severe rash involving mucus membranes or skin necrosis: No Has patient had a PCN reaction that required hospitalization No Has patient had a PCN reaction occurring within the last 10 years: Yes If all of the above answers are "NO", then may proceed with Cephalosporin use.  . Ibuprofen Other (See Comments)    Reaction:  GI upset   . Prilosec [Omeprazole] Other (See Comments)    Reaction:  Involuntary muscle movements   . Tramadol Nausea Only  . Sulfa Antibiotics Itching and Rash    Current Facility-Administered Medications  Medication Dose Route Frequency Provider Last Rate Last Dose  . 0.9 %  sodium  chloride infusion   Intravenous Continuous Juluis Mire, MD 100 mL/hr at 04/19/15 9702    . acetaminophen (TYLENOL) tablet 650 mg  650 mg Oral Q6H PRN Juluis Mire, MD       Or  . acetaminophen (TYLENOL) suppository 650 mg  650 mg Rectal Q6H PRN Juluis Mire, MD      . albuterol (PROVENTIL) (2.5 MG/3ML) 0.083% nebulizer solution 2.5 mg  2.5 mg Nebulization Q4H PRN Juluis Mire, MD      . aspirin EC tablet 81 mg  81 mg Oral Daily Juluis Mire, MD   81 mg at 04/19/15 0959  . atenolol (TENORMIN) tablet 12.5 mg  12.5 mg Oral Daily Juluis Mire, MD   12.5 mg at 04/19/15 0959  . aztreonam (AZACTAM) 2 g in dextrose 5 % 50 mL IVPB  2 g Intravenous 3 times per day Hinda Kehr, MD      . busPIRone  (BUSPAR) tablet 10 mg  10 mg Oral TID Juluis Mire, MD   10 mg at 04/19/15 0959  . diphenhydrAMINE (BENADRYL) capsule 25 mg  25 mg Oral Q8H PRN Juluis Mire, MD      . DULoxetine (CYMBALTA) DR capsule 90 mg  90 mg Oral Daily Juluis Mire, MD   90 mg at 04/19/15 0958  . enoxaparin (LOVENOX) injection 40 mg  40 mg Subcutaneous Daily Juluis Mire, MD      . exemestane (AROMASIN) tablet 25 mg  25 mg Oral Daily Juluis Mire, MD      . gabapentin (NEURONTIN) capsule 800 mg  800 mg Oral QID Juluis Mire, MD   800 mg at 04/19/15 0958  . levothyroxine (SYNTHROID, LEVOTHROID) tablet 50 mcg  50 mcg Oral QAC breakfast Juluis Mire, MD   50 mcg at 04/19/15 0959  . methadone (DOLOPHINE) 10 MG/ML solution 117 mg  117 mg Oral Daily Juluis Mire, MD      . mometasone-formoterol (DULERA) 100-5 MCG/ACT inhaler 2 puff  2 puff Inhalation BID Juluis Mire, MD      . morphine 2 MG/ML injection 2 mg  2 mg Intravenous Q4H PRN Juluis Mire, MD      . morphine 4 MG/ML injection 4 mg  4 mg Intravenous Q2H PRN Hinda Kehr, MD   4 mg at 04/19/15 0355  . nicotine (NICODERM CQ - dosed in mg/24 hours) patch 14 mg  14 mg Transdermal Daily Juluis Mire, MD   14 mg at 04/19/15 0958  . ondansetron (ZOFRAN) tablet 4 mg  4 mg Oral Q6H PRN Juluis Mire, MD       Or  . ondansetron [facility]) injection 4 mg  4 mg Intravenous Q6H PRN Juluis Mire, MD      . ondansetron [facility]) tablet 4 mg  4 mg Oral Q8H PRN Juluis Mire, MD      . palbociclib [facility]) capsule 125 mg  125 mg Oral Daily Juluis Mire, MD      . pantoprazole (PROTONIX) EC tablet 40 mg  40 mg Oral QAC breakfast Juluis Mire, MD   40 mg at 04/19/15 0959  . vancomycin (VANCOCIN) 500 mg in sodium chloride 0.9 % 100 mL IVPB  500 mg Intravenous Q12H Hinda Kehr, MD        OBJECTIVE:  Filed Vitals:   04/19/15 0518 04/19/15 0648  BP: 134/104   Pulse: 113   Temp: 102.3 F (39.1  C) 98.7 F (37.1 C)  Resp: 18        Body mass index is 21.99 kg/(m^2).    ECOG FS:2 - Symptomatic, <50% confined to bed  Physical Exam  Constitutional: She is oriented to person, place, and time and well-developed, well-nourished, and in no distress. No distress.  Body mass index is 21.99 kg/(m^2). Been elderly Caucasian female, appearing older than stated age.  HENT:  Head: Normocephalic and atraumatic.  Right Ear: External ear normal.  Left Ear: External ear normal.  Mouth/Throat: Oropharynx is clear and moist.  Eyes: Conjunctivae are normal. Pupils are equal, round, and reactive to light. Right eye exhibits no discharge. Left eye exhibits no discharge. No scleral icterus.  Neck: Normal range of motion. Neck supple. No JVD present. No tracheal deviation present. No thyromegaly present.  Cardiovascular: Normal rate, regular rhythm, normal heart sounds and intact distal pulses.  Exam reveals no gallop and no friction rub.   No murmur heard. Pulmonary/Chest: Effort normal. No stridor. No respiratory distress. She has no wheezes. She has no rales. She exhibits no tenderness.  Decreased breath sounds on the right base  Abdominal: Soft. Bowel sounds are normal. She exhibits no distension and no mass. There is no tenderness. There is no rebound and no guarding.  Genitourinary:  Postponed  Musculoskeletal: Normal range of motion. She exhibits no edema or tenderness.  Lymphadenopathy:    She has no cervical adenopathy.  Neurological: She is alert and oriented to person, place, and time. She has normal reflexes. No cranial nerve deficit. She exhibits normal muscle tone. Gait normal. Coordination normal. GCS score is 15.  Skin: Skin is warm. No rash noted. She is not diaphoretic. No erythema. No pallor.  Sacral ulcer, covered with pad.  Psychiatric: Mood, memory, affect and judgment normal.     LAB RESULTS:  CBC Latest Ref Rng 04/19/2015 04/19/2015  WBC 3.6 - 11.0 K/uL 2.2(L) 1.7(L)  Hemoglobin 12.0 - 16.0 g/dL 8.2(L)  10.0(L)  Hematocrit 35.0 - 47.0 % 26.6(L) 31.8(L)  Platelets 150 - 440 K/uL 81(L) 105(L)    Admission on 04/19/2015  Component Date Value Ref Range Status  . Lactic Acid, Venous 04/19/2015 2.0  0.5 - 2.0 mmol/L Final  . Lactic Acid, Venous 04/18/2015 0.7  0.5 - 2.0 mmol/L Final  . Sodium 04/19/2015 140  135 - 145 mmol/L Final  . Potassium 04/19/2015 3.2* 3.5 - 5.1 mmol/L Final  . Chloride 04/19/2015 108  101 - 111 mmol/L Final  . CO2 04/19/2015 26  22 - 32 mmol/L Final  . Glucose, Bld 04/19/2015 82  65 - 99 mg/dL Final  . BUN 04/19/2015 13  6 - 20 mg/dL Final  . Creatinine, Ser 04/19/2015 0.59  0.44 - 1.00 mg/dL Final  . Calcium 04/19/2015 8.5* 8.9 - 10.3 mg/dL Final  . Total Protein 04/19/2015 6.3* 6.5 - 8.1 g/dL Final  . Albumin 04/19/2015 3.3* 3.5 - 5.0 g/dL Final  . AST 04/19/2015 20  15 - 41 U/L Final  . ALT 04/19/2015 11* 14 - 54 U/L Final  . Alkaline Phosphatase 04/19/2015 68  38 - 126 U/L Final  . Total Bilirubin 04/19/2015 0.6  0.3 - 1.2 mg/dL Final  . GFR calc non Af Amer 04/19/2015 >60  >60 mL/min Final  . GFR calc Af Amer 04/19/2015 >60  >60 mL/min Final   Comment: (NOTE) The eGFR has been calculated using the CKD EPI equation. This calculation has not been validated in all clinical situations. eGFR's persistently <60  mL/min signify possible Chronic Kidney Disease.   . Anion gap 04/19/2015 6  5 - 15 Final  . Lipase 04/19/2015 20  11 - 51 U/L Final  . Troponin I 04/19/2015 0.03  <0.031 ng/mL Final   Comment:        NO INDICATION OF MYOCARDIAL INJURY.   . WBC 04/19/2015 1.7* 3.6 - 11.0 K/uL Final  . RBC 04/19/2015 3.38* 3.80 - 5.20 MIL/uL Final  . Hemoglobin 04/19/2015 10.0* 12.0 - 16.0 g/dL Final  . HCT 04/19/2015 31.8* 35.0 - 47.0 % Final  . MCV 04/19/2015 94.2  80.0 - 100.0 fL Final  . MCH 04/19/2015 29.5  26.0 - 34.0 pg Final  . MCHC 04/19/2015 31.3* 32.0 - 36.0 g/dL Final  . RDW 04/19/2015 21.9* 11.5 - 14.5 % Final  . Platelets 04/19/2015 105* 150 - 440  K/uL Final  . Neutrophils Relative % 04/19/2015 67   Final  . Neutro Abs 04/19/2015 1.2* 1.4 - 6.5 K/uL Final  . Lymphocytes Relative 04/19/2015 23   Final  . Lymphs Abs 04/19/2015 0.4* 1.0 - 3.6 K/uL Final  . Monocytes Relative 04/19/2015 7   Final  . Monocytes Absolute 04/19/2015 0.1* 0.2 - 0.9 K/uL Final  . Eosinophils Relative 04/19/2015 3   Final  . Eosinophils Absolute 04/19/2015 0.0  0 - 0.7 K/uL Final  . Basophils Relative 04/19/2015 0   Final  . Basophils Absolute 04/19/2015 0.0  0 - 0.1 K/uL Final  . aPTT 04/19/2015 35  24 - 36 seconds Final  . Prothrombin Time 04/19/2015 15.1* 11.4 - 15.0 seconds Final  . INR 04/19/2015 1.17   Final  . Color, Urine 04/19/2015 YELLOW* YELLOW Final  . APPearance 04/19/2015 CLEAR* CLEAR Final  . Glucose, UA 04/19/2015 NEGATIVE  NEGATIVE mg/dL Final  . Bilirubin Urine 04/19/2015 NEGATIVE  NEGATIVE Final  . Ketones, ur 04/19/2015 NEGATIVE  NEGATIVE mg/dL Final  . Specific Gravity, Urine 04/19/2015 1.040* 1.005 - 1.030 Final  . Hgb urine dipstick 04/19/2015 NEGATIVE  NEGATIVE Final  . pH 04/19/2015 5.0  5.0 - 8.0 Final  . Protein, ur 04/19/2015 NEGATIVE  NEGATIVE mg/dL Final  . Nitrite 04/19/2015 NEGATIVE  NEGATIVE Final  . Leukocytes, UA 04/19/2015 NEGATIVE  NEGATIVE Final  . RBC / HPF 04/19/2015 0-5  0 - 5 RBC/hpf Final  . WBC, UA 04/19/2015 0-5  0 - 5 WBC/hpf Final  . Bacteria, UA 04/19/2015 NONE SEEN  NONE SEEN Final  . Squamous Epithelial / LPF 04/19/2015 0-5* NONE SEEN Final  . Mucous 04/19/2015 PRESENT   Final  . WBC 04/19/2015 2.2* 3.6 - 11.0 K/uL Final  . RBC 04/19/2015 2.78* 3.80 - 5.20 MIL/uL Final  . Hemoglobin 04/19/2015 8.2* 12.0 - 16.0 g/dL Final  . HCT 04/19/2015 26.6* 35.0 - 47.0 % Final  . MCV 04/19/2015 95.8  80.0 - 100.0 fL Final  . MCH 04/19/2015 29.5  26.0 - 34.0 pg Final  . MCHC 04/19/2015 30.8* 32.0 - 36.0 g/dL Final  . RDW 04/19/2015 21.4* 11.5 - 14.5 % Final  . Platelets 04/19/2015 81* 150 - 440 K/uL Final  .  Sodium 04/19/2015 138  135 - 145 mmol/L Final  . Potassium 04/19/2015 3.7  3.5 - 5.1 mmol/L Final  . Chloride 04/19/2015 110  101 - 111 mmol/L Final  . CO2 04/19/2015 25  22 - 32 mmol/L Final  . Glucose, Bld 04/19/2015 102* 65 - 99 mg/dL Final  . BUN 04/19/2015 10  6 - 20 mg/dL Final  . Creatinine,  Ser 04/19/2015 0.65  0.44 - 1.00 mg/dL Final  . Calcium 04/19/2015 7.6* 8.9 - 10.3 mg/dL Final  . GFR calc non Af Amer 04/19/2015 >60  >60 mL/min Final  . GFR calc Af Amer 04/19/2015 >60  >60 mL/min Final   Comment: (NOTE) The eGFR has been calculated using the CKD EPI equation. This calculation has not been validated in all clinical situations. eGFR's persistently <60 mL/min signify possible Chronic Kidney Disease.   . Anion gap 04/19/2015 3* 5 - 15 Final     STUDIES: Dg Chest 2 View  03/22/2015  CLINICAL DATA:  Acute onset of shortness of breath and generalized chest pain. Current history of lung cancer. Patient on chemotherapy. Initial encounter. EXAM: CHEST  2 VIEW COMPARISON:  Chest radiograph performed 09/06/2014, and CT of the chest performed 12/19/2014 FINDINGS: There is elevation of the right hemidiaphragm. Mild right basilar airspace opacity reflects the patient's known lung cancer. The left lung appears relatively clear. No definite pleural effusion or pneumothorax is seen. The heart is mildly enlarged. A right-sided chest port is noted ending about the distal SVC. No acute osseous abnormalities are seen. Clips are noted within the right upper quadrant, reflecting prior cholecystectomy. IMPRESSION: Elevation of the right hemidiaphragm. Known right-sided lung cancer again noted. Mild cardiomegaly. Electronically Signed   By: Garald Balding M.D.   On: 03/22/2015 20:04   Ct Angio Chest Pe W/cm &/or Wo Cm  04/19/2015  CLINICAL DATA:  Acute onset of dizziness, shakiness and shortness of breath. Pain radiates down both legs. Initial encounter. EXAM: CT ANGIOGRAPHY CHEST WITH CONTRAST  TECHNIQUE: Multidetector CT imaging of the chest was performed using the standard protocol during bolus administration of intravenous contrast. Multiplanar CT image reconstructions and MIPs were obtained to evaluate the vascular anatomy. CONTRAST:  77m OMNIPAQUE IOHEXOL 350 MG/ML SOLN COMPARISON:  Chest radiograph performed earlier today at 1:40 a.m., and CT of the chest performed 12/19/2014 FINDINGS: There is no evidence of pulmonary embolus. Multiple masses at the right lung base have increased in size, the largest now measuring 5.2 cm. Mild adjacent atelectasis is noted. A trace likely malignant right-sided pleural effusion is seen. Findings are compatible with worsening malignancy. No definite pneumonia is seen. Mild peripheral scarring is noted within both lungs. No suspicious nodules are noted on the left side. No pneumothorax is seen. Diffuse coronary artery calcifications are noted. A moderate hiatal hernia is noted. A 2.2 cm right paratracheal node raises suspicion for metastatic disease. No pericardial effusion is identified. The great vessels are grossly unremarkable in appearance. No axillary lymphadenopathy is seen. The thyroid gland is unremarkable in appearance. A right-sided chest port is noted ending about the cavoatrial junction. The patient is status post right-sided mastectomy. A few small epicardial fat pad nodes are nonspecific, measuring up to 7 mm. The visualized portions of the liver and spleen are unremarkable. The patient is status post cholecystectomy, with clips noted along the gallbladder fossa. The visualized portions of the pancreas, adrenal glands and kidneys are grossly unremarkable, though difficult to fully assess due to motion artifact. The colon is partially filled with stool. Scattered calcification is noted along the proximal abdominal aorta and its branches. No acute osseous abnormalities are seen. Degenerative change is noted at the right glenohumeral joint. Review of the  MIP images confirms the above findings. IMPRESSION: 1. No evidence of pulmonary embolus. 2. Multiple masses at the right lung base have increased in size, the largest measuring 5.2 cm. Mild adjacent atelectasis noted. Trace  likely malignant right-sided pleural effusion seen. Findings are compatible with worsening malignancy. No evidence of pneumonia at this time. 3. 2.2 cm right paratracheal node raises suspicion for metastatic disease. 4. Mild peripheral scarring noted within both lungs. 5. Diffuse coronary artery calcifications seen. 6. Moderate hiatal hernia noted. 7. Scattered calcification along the proximal abdominal aorta and its branches. 8. Degenerative change at the right glenohumeral joint. Electronically Signed   By: Garald Balding M.D.   On: 04/19/2015 03:21   Dg Chest Port 1 View  04/19/2015  CLINICAL DATA:  Acute onset of shortness of breath, dizziness and shakiness. Pain radiates down both legs. Initial encounter. EXAM: PORTABLE CHEST 1 VIEW COMPARISON:  Chest radiograph performed 03/22/2015 FINDINGS: There is elevation of the right hemidiaphragm. 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 Superimposed pneumonia cannot be excluded. Mild vascular congestion is seen. No definite pleural effusion or pneumothorax is identified. The cardiomediastinal silhouette is mildly enlarged. No acute osseous abnormalities are seen. Sclerotic foci within the left humerus are nonspecific. IMPRESSION: Elevation of the right hemidiaphragm. Right basilar lung cancer again noted. Superimposed pneumonia cannot be excluded. Mild vascular congestion and mild cardiomegaly noted. Electronically Signed   By: Garald Balding M.D.   On: 04/19/2015 02:16    ASSESSMENT: Suspected sepsis-patient had high fever yesterday, which prompted her admission to the hospital. She has improved significantly on IV aztreonam and vancomycin. There is no clear source of infection at this point, blood and urine cultures were  drawn and pending. CT of the chest showed no evidence of pneumonia, but progression of known breast cancer metastasis. We should continue with this gentleman vancomycin for at least another 24 hours. If the patient's cultures continued to remain negative, should consider switching to oral fluoroquinolone, and if stable, discharge and wasn't in the next 24-48 hours. Patient expressed very strong desire to leave the hospital, however agreed to stay for another 24 hours for observation and treatment. Absolute neutrophil count is within safe range, patient does not neutropenic  Metastatic breast cancer-patient currently is on Aromasin and palbociclib, , however CAT scan of the chest suggests progression of the disease. Dr. Tish Men will discuss with the patient the further course of action this week.   Patient expressed understanding and was in agreement with this plan. She also understands that She can call clinic at any time with any questions, concerns, or complaints.    Breast cancer metastasized to lung [facility])   Staging form: Lung, AJCC 7th Edition     Clinical stage from 09/10/2014: Stage IV (T2, N0, M1b) - Signed by Leia Alf, MD on 09/10/2014   Roxana Hires, MD   04/19/2015 10:13 AM

## 2015-04-19 NOTE — ED Notes (Signed)
Patient transported to CT 

## 2015-04-20 ENCOUNTER — Ambulatory Visit: Payer: Medicare Other

## 2015-04-20 LAB — URINE CULTURE: Culture: NO GROWTH

## 2015-04-20 MED ORDER — LEVOFLOXACIN 500 MG PO TABS: 500.0000 mg | ORAL_TABLET | Freq: Every day | ORAL | Status: DC

## 2015-04-20 MED ORDER — HEPARIN SOD (PORK) LOCK FLUSH 100 UNIT/ML IV SOLN
500.0000 [IU] | Freq: Once | INTRAVENOUS | Status: AC
  Administered 2015-04-20: 500 [IU] via INTRAVENOUS
  Filled 2015-04-20: qty 5

## 2015-04-20 NOTE — Plan of Care (Signed)
Pt afebrile today.  C/o of significant leg and back pain - not relieved w/4 mg of morphine.  Being d/ced home.  Care Mgmt asked [facility] to get involved and to have Social Worker input about living situation.  She lives at home w/son and is wheelchair bound.  Neither pt or son drives.  Per pt, she's lost a significant amt of weight.  Came in w/fever - improved on IV abx.  Followed [facility] and currently taking oral chemo.  CT showed no evidence of PNA, or PE but progression of Ca. IV removed, port de-accessed.  D/c instructions reviewed.  EMS will be called to take her home.

## 2015-04-20 NOTE — Care Management (Signed)
Admitted to [facility] with the diagnosis of fever/sepsis. Discharged from this facility 03/23/15. Lives with son, Legrand Com[place] [phone]). Hasn't seen Dr. Luan Pulling since last discharge. [place] visit in November, seen Dr. Edison Simon 20 hours a week assistance from [facility]. No home Health. No home oxygen. [place] in the past.  Wheelchair bound. States she fell asleep in her wheelchair and fell out on to the carpet since last discharge. Doesn't have [facility]. States she can't afford [facility], "my son is with me all the time." Would like to go home per [facility] Rescue unit. States her son doesn't drive. Gets to the [place] thru [place]. May need [facility] when discharged. [name] RN MSN CCM Care Management [phone]

## 2015-04-20 NOTE — Plan of Care (Signed)
Problem: Safety: Goal: Ability to remain free from injury will improve Outcome: Progressing High fall precautions per policy. One assist to [facility].  Problem: Pain Managment: Goal: General experience of comfort will improve Outcome: Progressing PRN morphine given for pain with improvement.

## 2015-04-20 NOTE — Discharge Summary (Signed)
Betty Edwards, is a 69 y.o. female  DOB [birth year]  MRN [id number].  Admission date:  04/19/2015  Admitting Physician  Juluis Mire, MD  Discharge Date:  04/20/2015   Primary MD  Betty Doe, MD  Recommendations for primary care physician for things to follow:  Follow up wit Dr.Brahmanday in one week    Admission Diagnosis  Shortness of breath [R06.02] Sepsis, due to unspecified organism ([place]) [A41.9] Fever, unspecified fever cause [R50.9] Chest pain, unspecified chest pain type [R07.9]   Discharge Diagnosis  Shortness of breath [R06.02] Sepsis, due to unspecified organism ([place]) [A41.9] Fever, unspecified fever cause [R50.9] Chest pain, unspecified chest pain type [R07.9]    Principal Problem:   Fever presenting with conditions classified elsewhere Active Problems:   Hypertension   Breast cancer metastasized to lung ([place])   CAFL (chronic airflow limitation) (HCC)   Hypokalemia   HTN (hypertension), benign      Past Medical History  Diagnosis Date  . Breast cancer metastasized to lung ([place]) 09/07/2014  . Carcinoma of breast metastatic to lung ([place]) 09/22/2014  . Hypertension   . MRSA (methicillin resistant Staphylococcus aureus)   . COPD (chronic obstructive pulmonary disease) ([place])   . Asthma     Past Surgical History  Procedure Laterality Date  . Mastectomy    . Cholecystectomy    . Cesarean section         History of present illness and  Hospital Course:     Kindly see H&P for history of present illness and admission details, please review complete Labs, Consult reports and Test reports for all details in brief  HPI  from the history and physical done on the day of admission 69 yr  Old female admitted for  Sepsis with pneumonia.has h/o metastatic breast CA.,    Hospital Course  69 yr  old with sepsis; due tom pnumonia;received iv fluids,iv azactam and vanco.afebirle.blood cultures negative.seen by oncology and he recommended to d/c home with levaquin. Pt is eager to go home today due to oral chemo that is available only at home.  2.chornicpain syndrome with neuropathy;continue  home meds, 3.metastatic breast Ca;on oral chemo; 4.htn;controlled. 5.hypothyrodism;continue synthyroid 6.depression;zoloft. 7.hypokalemia;resolved Discharged home [facility]  Discharge Condition:stable   Follow UP  Follow-up Information    Follow up with Betty Doe, MD. Go on 04/27/2015.   Specialty:  Family Medicine   Why:  at 8:00 a.m.   Contact information:   [place] [id number] [phone]       Follow up with Betty Sickle, MD On 04/23/2015.   Specialties:  Internal Medicine, Oncology   Why:  at 2:30 a.m.   Contact information:   [place] [address] [phone]         Discharge Instructions  and  Discharge Medications     Discharge Instructions    Ambulatory referral to Hospice    Complete by:  As directed      Face-to-face encounter (required for Medicare/Medicaid patients)    Complete by:  As directed   I Shalise Rosado certify that this patient is under my care and that I, or a nurse practitioner or physician's assistant working with me, had a face-to-face encounter that meets the physician face-to-face encounter requirements with this patient on 04/20/2015. The encounter with the patient was in whole, or in part for the following medical condition(s) which is the primary reason for home health  Metastatic breast CA  The encounter with the patient was in whole, or in part, for the following medical condition, which is the primary reason for home health care:  whole  I certify that, based on my findings, the following services are medically necessary home health services:  Nursing  Reason for  Medically Necessary Home Health Services:  Skilled Nursing- Change/Decline in Patient Status  My clinical findings support the need for the above services:  Shortness of breath with activity  Further, I certify that my clinical findings support that this patient is homebound due to:  Unable to leave home safely without assistance     Home Health    Complete by:  As directed   To provide the following care/treatments:  RN  [facility]            Medication List    TAKE these medications        albuterol 108 (90 BASE) MCG/ACT inhaler  Commonly known as:  PROVENTIL HFA;VENTOLIN HFA  Inhale 2 puffs into the lungs every 6 (six) hours as needed for wheezing or shortness of breath.     atenolol 25 MG tablet  Commonly known as:  TENORMIN  Take 0.5 tablets (12.5 mg total) by mouth daily.     busPIRone 10 MG tablet  Commonly known as:  BUSPAR  Take 1 tablet (10 mg total) by mouth 3 (three) times daily.     diphenhydrAMINE 25 MG tablet  Commonly known as:  BENADRYL  Take 1 tablet (25 mg total) by mouth every 8 (eight) hours as needed for itching.     DULoxetine 30 MG capsule  Commonly known as:  CYMBALTA  Take 90 mg by mouth daily.     exemestane 25 MG tablet  Commonly known as:  AROMASIN  Take 1 tablet (25 mg total) by mouth daily.     Fluticasone-Salmeterol 250-50 MCG/DOSE Aepb  Commonly known as:  ADVAIR  Inhale 1 puff into the lungs 2 (two) times daily as needed (for shortness of breath.).     gabapentin 800 MG tablet  Commonly known as:  NEURONTIN  Take 800 mg by mouth 4 (four) times daily.     IBRANCE 125 MG capsule  Generic drug:  palbociclib  Take 125 mg by mouth daily.     levofloxacin 500 MG tablet  Commonly known as:  LEVAQUIN  Take 1 tablet (500 mg total) by mouth daily.     levothyroxine 50 MCG tablet  Commonly known as:  SYNTHROID, LEVOTHROID  Take 1 tablet (50 mcg total) by mouth daily before breakfast.     methadone 10 MG/ML solution  Commonly  known as:  DOLOPHINE  Take 117 mg by mouth daily.     nicotine 14 mg/24hr patch  Commonly known as:  NICODERM CQ - dosed in mg/24 hours  Place 1 patch (14 mg total) onto the skin daily.     ondansetron 4 MG tablet  Commonly known as:  ZOFRAN  Take 1 tablet (4 mg total) by mouth every 8 (eight) hours as needed for nausea or vomiting.     pantoprazole 40 MG tablet  Commonly known as:  PROTONIX  Take 1 tablet (40 mg total) by mouth daily.          Diet and Activity recommendation: See Discharge Instructions above   Consults obtained - oncology   Major procedures and Radiology Reports - PLEASE review detailed and final reports for all details, in brief -  none   Dg Chest 2 View  03/22/2015  CLINICAL DATA:  Acute onset of shortness of breath and generalized chest pain. Current history of lung cancer. Patient on chemotherapy. Initial encounter. EXAM: CHEST  2 VIEW COMPARISON:  Chest radiograph performed 09/06/2014, and CT of the chest performed 12/19/2014 FINDINGS: There is elevation of the right hemidiaphragm. Mild right basilar airspace opacity reflects the patient's known lung cancer. The left lung appears relatively clear. No definite pleural effusion or pneumothorax is seen. The heart is mildly enlarged. A right-sided chest port is noted ending about the distal SVC. No acute osseous abnormalities are seen. Clips are noted within the right upper quadrant, reflecting prior cholecystectomy. IMPRESSION: Elevation of the right hemidiaphragm. Known right-sided lung cancer again noted. Mild cardiomegaly. Electronically Signed   By: Garald Balding M.D.   On: 03/22/2015 20:04   Ct Angio Chest Pe W/cm &/or Wo Cm  04/19/2015  CLINICAL DATA:  Acute onset of dizziness, shakiness and shortness of breath. Pain radiates down both legs. Initial encounter. EXAM: CT ANGIOGRAPHY CHEST WITH CONTRAST TECHNIQUE: Multidetector CT imaging of the chest was performed using the standard protocol during  bolus administration of intravenous contrast. Multiplanar CT image reconstructions and MIPs were obtained to evaluate the vascular anatomy. CONTRAST:  56mL OMNIPAQUE IOHEXOL 350 MG/ML SOLN COMPARISON:  Chest radiograph performed earlier today at 1:40 a.m., and CT of the chest performed 12/19/2014 FINDINGS: There is no evidence of pulmonary embolus. Multiple masses at the right lung base have increased in size, the largest now measuring 5.2 cm. Mild adjacent atelectasis is noted. A trace likely malignant right-sided pleural effusion is seen. Findings are compatible with worsening malignancy. No definite pneumonia is seen. Mild peripheral scarring is noted within both lungs. No suspicious nodules are noted on the left side. No pneumothorax is seen. Diffuse coronary artery calcifications are noted. A moderate hiatal hernia is noted. A 2.2 cm right paratracheal node raises suspicion for metastatic disease. No pericardial effusion is identified. The great vessels are grossly unremarkable in appearance. No axillary lymphadenopathy is seen. The thyroid gland is unremarkable in appearance. A right-sided chest port is noted ending about the cavoatrial junction. The patient is status post right-sided mastectomy. A few small epicardial fat pad nodes are nonspecific, measuring up to 7 mm. The visualized portions of the liver and spleen are unremarkable. The patient is status post cholecystectomy, with clips noted along the gallbladder fossa. The visualized portions of the pancreas, adrenal glands and kidneys are grossly unremarkable, though difficult to fully assess due to motion artifact. The colon is partially filled with stool. Scattered calcification is noted along the proximal abdominal aorta and its branches. No acute osseous abnormalities are seen. Degenerative change is noted at the right glenohumeral joint. Review of the MIP images confirms the above findings. IMPRESSION: 1. No evidence of pulmonary embolus. 2.  Multiple masses at the right lung base have increased in size, the largest measuring 5.2 cm. Mild adjacent atelectasis noted. Trace likely malignant right-sided pleural effusion seen. Findings are compatible with worsening malignancy. No evidence of pneumonia at this time. 3. 2.2 cm right paratracheal node raises suspicion for metastatic disease. 4. Mild peripheral scarring noted within both lungs. 5. Diffuse coronary artery calcifications seen. 6. Moderate hiatal hernia noted. 7. Scattered calcification along the proximal abdominal aorta and its branches. 8. Degenerative change at the right glenohumeral joint. Electronically Signed   By: Garald Balding M.D.   On: 04/19/2015 03:21   Dg Chest [facility] [address]  04/19/2015  CLINICAL DATA:  Acute onset of shortness of breath, dizziness and shakiness. Pain radiates down both legs. Initial encounter. EXAM: PORTABLE CHEST 1 VIEW COMPARISON:  Chest radiograph performed 03/22/2015 FINDINGS: There is elevation of the right hemidiaphragm. 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 Superimposed pneumonia cannot be excluded. Mild vascular congestion is seen. No definite pleural effusion or pneumothorax is identified. The cardiomediastinal silhouette is mildly enlarged. No acute osseous abnormalities are seen. Sclerotic foci within the left humerus are nonspecific. IMPRESSION: Elevation of the right hemidiaphragm. Right basilar lung cancer again noted. Superimposed pneumonia cannot be excluded. Mild vascular congestion and mild cardiomegaly noted. Electronically Signed   By: Garald Balding M.D.   On: 04/19/2015 02:16    Micro Results    Recent Results (from the past 240 hour(s))  Blood Culture (routine x 2)     Status: None (Preliminary result)   Collection Time: 04/19/15  1:30 AM  Result Value Ref Range Status   Specimen Description BLOOD LEFT ARM  Final   Special Requests BOTTLES DRAWN AEROBIC AND ANAEROBIC 3CC,4CC  Final   Culture NO GROWTH 1 DAY  Final    Report Status PENDING  Incomplete  Blood Culture (routine x 2)     Status: None (Preliminary result)   Collection Time: 04/19/15  1:31 AM  Result Value Ref Range Status   Specimen Description BLOOD LEFT ASSIST CONTROL  Final   Special Requests BOTTLES DRAWN AEROBIC AND ANAEROBIC 4CC  Final   Culture NO GROWTH 1 DAY  Final   Report Status PENDING  Incomplete  Urine culture     Status: None   Collection Time: 04/19/15  1:31 AM  Result Value Ref Range Status   Specimen Description URINE, RANDOM  Final   Special Requests NONE  Final   Culture NO GROWTH 1 DAY  Final   Report Status 04/20/2015 FINAL  Final       Today   Subjective:   Joylene Draft today has no headache,no chest abdominal pain,no new weakness tingling or numbness, feels much better wants to go home today.   Objective:   Blood pressure 144/117, pulse 94, temperature 97.8 F (36.6 C), temperature source Oral, resp. rate 20, height 5\' 4"  (1.626 m), weight 61.19 kg (134 lb 14.4 oz), SpO2 97 %.   Intake/Output Summary (Last 24 hours) at 04/20/15 1452 Last data filed at 04/20/15 1018  Gross per 24 hour  Intake      0 ml  Output    550 ml  Net   -550 ml    Exam Awake Alert, Oriented x 3, No new F.N deficits, Normal affect Todd.AT,PERRAL Supple Neck,No JVD, No cervical lymphadenopathy appriciated.  Symmetrical Chest wall movement, Good air movement bilaterally, CTAB RRR,No Gallops,Rubs or new Murmurs, No Parasternal Heave +ve B.Sounds, Abd Soft, Non tender, No organomegaly appriciated, No rebound -guarding or rigidity. No Cyanosis, Clubbing or edema, No new Rash or bruise  Data Review   CBC w Diff: Lab Results  Component Value Date   WBC 2.2* 04/19/2015   WBC 4.5 09/06/2014   HGB 8.2* 04/19/2015   HGB 10.2* 09/06/2014   HCT 26.6* 04/19/2015   HCT 31.8* 09/06/2014   PLT 81* 04/19/2015   PLT 145* 09/06/2014   LYMPHOPCT 23 04/19/2015   LYMPHOPCT 14.5 09/06/2014   MONOPCT 7 04/19/2015   MONOPCT 2.9  09/06/2014   EOSPCT 3 04/19/2015   EOSPCT 0.7 09/06/2014   BASOPCT 0 04/19/2015   BASOPCT 0.2 09/06/2014  CMP: Lab Results  Component Value Date   NA 138 04/19/2015   NA 134* 09/06/2014   K 3.7 04/19/2015   K 3.8 09/06/2014   CL 110 04/19/2015   CL 102 09/06/2014   CO2 25 04/19/2015   CO2 27 09/06/2014   BUN 10 04/19/2015   BUN 12 09/06/2014   CREATININE 0.65 04/19/2015   CREATININE 0.63 09/06/2014   PROT 6.3* 04/19/2015   PROT 6.1* 09/06/2014   ALBUMIN 3.3* 04/19/2015   ALBUMIN 3.5 09/06/2014   BILITOT 0.6 04/19/2015   BILITOT 0.6 09/06/2014   ALKPHOS 68 04/19/2015   ALKPHOS 87 09/06/2014   AST 20 04/19/2015   AST 15 09/06/2014   ALT 11* 04/19/2015   ALT 9* 09/06/2014  .   Total Time in preparing paper work, data evaluation and todays exam - 40 minutes  Lenyx Boody M.D on 04/20/2015 at 2:52 PM    Note: This dictation was prepared with Dragon dictation along with smaller phrase technology. Any transcriptional errors that result from this process are unintentional.

## 2015-04-20 NOTE — Progress Notes (Signed)
New referral for [facility] on 69yo female who was admitted to [facility] on 12.11.16 with Dx SOB, Sepsis, Breast CA with mets to lung.  She has past history of breast cancer with mets to lung, HTN, COPD and asthma.  She is currently on oral chemo at home.  She was treated with IV azactam, IV vancomycin during this hospitalization and discharging home on PO levaquin.  Ordered disciplines:  SN.  Patient will discharge home today.  Updated information faxed to referral intake.  PCP:  Dr. Luan Pulling.

## 2015-04-20 NOTE — Care Management (Signed)
Discharge to home today per Dr. Vianne Bulls. Would like [facility] to follow. Ms. Crecelius is in agreement. Doreatha Lew RN representative for [employer] updated.  Would like to go home per [employer]. [name] RN MSN CCM Care Management [phone]

## 2015-04-23 ENCOUNTER — Inpatient Hospital Stay: Payer: Medicare Other | Admitting: Internal Medicine

## 2015-04-24 ENCOUNTER — Telehealth: Payer: Self-pay | Admitting: *Deleted

## 2015-04-24 LAB — CULTURE, BLOOD (ROUTINE X 2)
CULTURE: NO GROWTH
Culture: NO GROWTH

## 2015-04-24 NOTE — Telephone Encounter (Signed)
Asking for results of CT, she no showed for her appt on 12/15 because she had just gotten out of hospital and does not have an appt until 1/11. Her Ct shows disease progression. Per Dr Rogue Bussing, he needs to see her on Monday. She has agreed to an appt on 12/19 @ 300 pm

## 2015-04-27 ENCOUNTER — Encounter: Payer: Self-pay | Admitting: Internal Medicine

## 2015-04-27 ENCOUNTER — Inpatient Hospital Stay (HOSPITAL_BASED_OUTPATIENT_CLINIC_OR_DEPARTMENT_OTHER): Payer: Medicare Other | Admitting: Internal Medicine

## 2015-04-27 ENCOUNTER — Inpatient Hospital Stay: Payer: Medicare Other | Admitting: Family Medicine

## 2015-04-27 VITALS — BP 116/69 | HR 93 | Temp 97.8°F | Resp 18 | Ht 64.0 in | Wt 132.0 lb

## 2015-04-27 DIAGNOSIS — Z9049 Acquired absence of other specified parts of digestive tract: Secondary | ICD-10-CM | POA: Diagnosis not present

## 2015-04-27 DIAGNOSIS — Z9010 Acquired absence of unspecified breast and nipple: Secondary | ICD-10-CM

## 2015-04-27 DIAGNOSIS — Z170 Estrogen receptor positive status [ER+]: Secondary | ICD-10-CM | POA: Diagnosis not present

## 2015-04-27 DIAGNOSIS — R0602 Shortness of breath: Secondary | ICD-10-CM | POA: Diagnosis not present

## 2015-04-27 DIAGNOSIS — I10 Essential (primary) hypertension: Secondary | ICD-10-CM

## 2015-04-27 DIAGNOSIS — C50919 Malignant neoplasm of unspecified site of unspecified female breast: Secondary | ICD-10-CM | POA: Diagnosis present

## 2015-04-27 DIAGNOSIS — C7800 Secondary malignant neoplasm of unspecified lung: Secondary | ICD-10-CM

## 2015-04-27 DIAGNOSIS — Z8614 Personal history of Methicillin resistant Staphylococcus aureus infection: Secondary | ICD-10-CM | POA: Diagnosis not present

## 2015-04-27 DIAGNOSIS — Z79899 Other long term (current) drug therapy: Secondary | ICD-10-CM

## 2015-04-27 DIAGNOSIS — J45909 Unspecified asthma, uncomplicated: Secondary | ICD-10-CM

## 2015-04-27 DIAGNOSIS — F419 Anxiety disorder, unspecified: Secondary | ICD-10-CM | POA: Diagnosis not present

## 2015-04-27 DIAGNOSIS — J449 Chronic obstructive pulmonary disease, unspecified: Secondary | ICD-10-CM | POA: Diagnosis not present

## 2015-04-27 DIAGNOSIS — R05 Cough: Secondary | ICD-10-CM

## 2015-04-27 DIAGNOSIS — Z993 Dependence on wheelchair: Secondary | ICD-10-CM

## 2015-04-27 DIAGNOSIS — F17210 Nicotine dependence, cigarettes, uncomplicated: Secondary | ICD-10-CM

## 2015-04-27 DIAGNOSIS — R5383 Other fatigue: Secondary | ICD-10-CM | POA: Diagnosis not present

## 2015-04-27 NOTE — Progress Notes (Signed)
The patient is here to discuss her clinical tx plan. She is very anxious today and tearful. Patient states, "I am just not ready to accept my disease progression."

## 2015-04-27 NOTE — Progress Notes (Signed)
[facility] OFFICE PROGRESS NOTE  Patient Care Team: Arlis Porta., MD as PCP - General (Family Medicine)   SUMMARY OF ONCOLOGIC HISTORY:  # 2008- STAGE II s/p Mastec; ER/PR- POS; Her2 Neg; Oncotype-High risk s/p AC x4; Sep 2008 [facility]  # MARCH 2014- BREAST CANCER STAGE IV/metastatic Lung nodules/Ca-27-29 rising; MARCH 2014- STARTED [facility]  # MARCH 2015- Progression of Lung mets; STARTED Afinitor + Aromasin [poor tol]; July 2015- [place]   # Feb 2016- Progression in Lung; April 2016 [facility];   # AUG 2016- Progression in Lung; AUG 2016 START Ibrance + Aromasin; DEC 2016- CT- PROGRESSION- hospice  INTERVAL HISTORY:  69 year old female patient with above history of metastatic breast cancer ER/PR positive HER-2/neu negative Metastases to the lung status post multiple lines of treatment most recently on  ibrance with Aromasin  Since August 20 16; patient is here to review the results of her restaging CAT scan.  Patient was recently in the hospital for bronchitis treated with antibiotics.  Patient states appetite is good; she has gained weight. However she continues to be fatigued.  Patient denies any diarrhea.   Chronic cough/shortness of breath; denies any chest pain. No fevers at home.  REVIEW OF SYSTEMS:  A complete 10 point review of system is done which is negative except mentioned above/history of present illness.   PAST MEDICAL HISTORY :  Past Medical History  Diagnosis Date  . Breast cancer metastasized to lung ([place]) 09/07/2014  . Carcinoma of breast metastatic to lung ([place]) 09/22/2014  . Hypertension   . MRSA (methicillin resistant Staphylococcus aureus)   . COPD (chronic obstructive pulmonary disease) ([place])   . Asthma     PAST SURGICAL HISTORY :   Past Surgical History  Procedure Laterality Date  . Mastectomy    . Cholecystectomy    . Cesarean section      FAMILY HISTORY :   Family History  Problem Relation Age of Onset  . Aneurysm Mother    . Diabetes Father     SOCIAL HISTORY:   Social History  Substance Use Topics  . Smoking status: Current Every Day Smoker -- 1.00 packs/day for 46 years    Types: Cigarettes  . Smokeless tobacco: Never Used     Comment: smoked for 46 years  . Alcohol Use: No    ALLERGIES:  is allergic to penicillins; ibuprofen; prilosec; tramadol; and sulfa antibiotics.  MEDICATIONS:  Current Outpatient Prescriptions  Medication Sig Dispense Refill  . albuterol (PROVENTIL HFA;VENTOLIN HFA) 108 (90 BASE) MCG/ACT inhaler Inhale 2 puffs into the lungs every 6 (six) hours as needed for wheezing or shortness of breath. 1 Inhaler 2  . atenolol (TENORMIN) 25 MG tablet Take 0.5 tablets (12.5 mg total) by mouth daily. 45 tablet 3  . busPIRone (BUSPAR) 10 MG tablet Take 1 tablet (10 mg total) by mouth 3 (three) times daily. 90 tablet 6  . diphenhydrAMINE (BENADRYL) 25 MG tablet Take 1 tablet (25 mg total) by mouth every 8 (eight) hours as needed for itching. 90 tablet 0  . DULoxetine (CYMBALTA) 30 MG capsule Take 90 mg by mouth daily.    Marland Kitchen exemestane (AROMASIN) 25 MG tablet Take 1 tablet (25 mg total) by mouth daily. 30 tablet 1  . gabapentin (NEURONTIN) 800 MG tablet Take 800 mg by mouth 4 (four) times daily.     Leslee Home 125 MG capsule Take 125 mg by mouth daily.    Marland Kitchen levothyroxine (SYNTHROID, LEVOTHROID) 50 MCG tablet Take  1 tablet (50 mcg total) by mouth daily before breakfast. 30 tablet 3  . methadone (DOLOPHINE) 10 MG/ML solution Take 117 mg by mouth daily.    . ondansetron (ZOFRAN) 4 MG tablet Take 1 tablet (4 mg total) by mouth every 8 (eight) hours as needed for nausea or vomiting. 60 tablet 2  . pantoprazole (PROTONIX) 40 MG tablet Take 1 tablet (40 mg total) by mouth daily. (Patient taking differently: Take 40 mg by mouth daily as needed (for acid reflux.). ) 90 tablet 3  . Fluticasone-Salmeterol (ADVAIR) 250-50 MCG/DOSE AEPB Inhale 1 puff into the lungs 2 (two) times daily as needed (for shortness  of breath.). Reported on 04/27/2015     No current facility-administered medications for this visit.    PHYSICAL EXAMINATION: ECOG PERFORMANCE STATUS: 3 - Symptomatic, >50% confined to bed  BP 116/69 mmHg  Pulse 93  Temp(Src) 97.8 F (36.6 C) (Tympanic)  Resp 18  Ht _0  (1.626 m)  Wt 132 lb (59.875 kg)  BMI 22.65 kg/m2  Filed Weights   04/27/15 1506  Weight: 132 lb (59.875 kg)    GENERAL: Cachectic appearing Caucasian female patient Alert, no distress and comfortable.    Alone. She is in a wheelchair/ secondary to arthritic changes. EYES: no pallor or icterus OROPHARYNX: no thrush or ulceration; poor dentition LYMPH:  no palpable lymphadenopathy in the cervical, axillary. LUNGS:  Decreased breath sounds bilaterally.  No wheeze or crackles HEART/CVS: regular rate & rhythm and no murmurs;  Mild erythema of the  Right lower extremity/improving ABDOMEN:abdomen soft, non-tender and normal bowel sounds Musculoskeletal:no cyanosis of digits;  Significant arthritic changes PSYCH: alert & oriented x 3 with fluent speech NEURO: no focal motor/sensory deficits SKIN:  no rashes or significant lesions  LABORATORY DATA:  I have reviewed the data as listed    Component Value Date/Time   NA 138 04/19/2015 0557   NA 134* 09/06/2014 1958   K 3.7 04/19/2015 0557   K 3.8 09/06/2014 1958   CL 110 04/19/2015 0557   CL 102 09/06/2014 1958   CO2 25 04/19/2015 0557   CO2 27 09/06/2014 1958   GLUCOSE 102* 04/19/2015 0557   GLUCOSE 95 09/06/2014 1958   BUN 10 04/19/2015 0557   BUN 12 09/06/2014 1958   CREATININE 0.65 04/19/2015 0557   CREATININE 0.63 09/06/2014 1958   CALCIUM 7.6* 04/19/2015 0557   CALCIUM 8.3* 09/06/2014 1958   PROT 6.3* 04/19/2015 0130   PROT 6.1* 09/06/2014 1958   ALBUMIN 3.3* 04/19/2015 0130   ALBUMIN 3.5 09/06/2014 1958   AST 20 04/19/2015 0130   AST 15 09/06/2014 1958   ALT 11* 04/19/2015 0130   ALT 9* 09/06/2014 1958   ALKPHOS 68 04/19/2015 0130   ALKPHOS  87 09/06/2014 1958   BILITOT 0.6 04/19/2015 0130   BILITOT 0.6 09/06/2014 1958   GFRNONAA >60 04/19/2015 0557   GFRNONAA >60 09/06/2014 1958   GFRNONAA >60 06/13/2014 1320   GFRAA >60 04/19/2015 0557   GFRAA >60 09/06/2014 1958   GFRAA >60 06/13/2014 1320    No results found for: SPEP, UPEP  Lab Results  Component Value Date   WBC 2.2* 04/19/2015   NEUTROABS 1.2* 04/19/2015   HGB 8.2* 04/19/2015   HCT 26.6* 04/19/2015   MCV 95.8 04/19/2015   PLT 81* 04/19/2015      Chemistry      Component Value Date/Time   NA 138 04/19/2015 0557   NA 134* 09/06/2014 1958   K 3.7 04/19/2015  0557   K 3.8 09/06/2014 1958   CL 110 04/19/2015 0557   CL 102 09/06/2014 1958   CO2 25 04/19/2015 0557   CO2 27 09/06/2014 1958   BUN 10 04/19/2015 0557   BUN 12 09/06/2014 1958   CREATININE 0.65 04/19/2015 0557   CREATININE 0.63 09/06/2014 1958      Component Value Date/Time   CALCIUM 7.6* 04/19/2015 0557   CALCIUM 8.3* 09/06/2014 1958   ALKPHOS 68 04/19/2015 0130   ALKPHOS 87 09/06/2014 1958   AST 20 04/19/2015 0130   AST 15 09/06/2014 1958   ALT 11* 04/19/2015 0130   ALT 9* 09/06/2014 1958   BILITOT 0.6 04/19/2015 0130   BILITOT 0.6 09/06/2014 1958       RADIOGRAPHIC STUDIES: I have personally reviewed the radiological images as listed and agreed with the findings in the report. No results found.   ASSESSMENT & PLAN:   # Metastatic Breast cancer  Lungs  ER/PR positive HER-2/neu negative  Status post multiple lines of therapy most recently on ibrance plus Aromasin since August 2016. December 2016 CT scan shows progression of lung lesions.   # Patient has poor performance status/wheelchair bound secondary to arthritis/ also multiple admissions to the hospital for infections. The context of poor overall health/performance status-- status post multiple lines of therapy- I think treatment options are limited. I'm concerned that patient would have more side effects than any benefits of  the therapy. I reviewed this with the patient in detail. She understands. She is agreeable to hospice.  # Discussed the hospice philosophy in detail.   # Increasing anxiety issues- patient is on multiple antidepressants/CNS medications. Hold off any additional antianxiety medication at this time.  # 40 minutes face-to-face with the patient discussing the above plan of care; more than 50% of time spent on prognosis/ natural history; counseling and coordination. I reviewed the CT images myself; reviewed the images with the patient in detail.  I also spoke to patient's daughter Suanne Marker over the phone at [phone] updated her of the above plan.    Cammie Sickle, MD 04/27/2015 3:56 PM

## 2015-04-30 ENCOUNTER — Telehealth: Payer: Self-pay | Admitting: *Deleted

## 2015-04-30 NOTE — Telephone Encounter (Signed)
Patient admitted to hospice services on Tuesday. She would like to speak to someone regarding the patient's clinic history for pain management. The patient was going to the methadone clinic every morning for lqd methadone.  She has no pain medication in the home. She has been "eating Tylenol PM. She had 100 tablets of the Tylenol PM on Monday. And 9 tablets on Wednesday. The 100 mg of Tylenol PM was all taken after the patient left the cancer center on Monday per patient/hospice nurse. " Seth Bake, RN "will talk to the hospice director Dr. Luvenia Heller. She would like our oncology opinion on what to order for pain as this is a difficult situation."  Return Cell: [phone].    [phone] am-I called Seth Bake, RN back.  She states the hospice is unable to prescribe the patient's oral lqd methadone until the patient is no longer able to go to the methadone clinic or is bedbound per hospice policies. Dr. Luvenia Heller recommended that the patient continue to go to the methadone clinic as scheduled daily. The patient is extremely anxious. Dr. Luvenia Heller at hospice suggested to have patient try Seroquel to help with mood/anxiety. This order was called into [facility] under Dr. Luvenia Heller by Seth Bake. I explained to Seth Bake that Dr. Rogue Bussing is currently out of town, but I would be happy to discuss the patient's care with the on call provider. She states that since Dr. Luvenia Heller is involved with the patient's care, Dr. Luvenia Heller will take care of patient & narcotic recommendations until Dr. Rogue Bussing returns from vacation. Dr. Luvenia Heller states her goal is to decrease the amt of Tylenol PM that the patient is on.

## 2015-05-06 ENCOUNTER — Telehealth: Payer: Self-pay | Admitting: *Deleted

## 2015-05-06 NOTE — Telephone Encounter (Signed)
Patient is using comfort kit meds without permission. Asking for order to remove kit form home. Feels patient is drug seeking. States  That hospice is going through the methadone clinic doc before giving her meds. Permission given to remove comfort kit from home

## 2015-05-08 ENCOUNTER — Other Ambulatory Visit: Payer: Self-pay | Admitting: Family Medicine

## 2015-05-19 ENCOUNTER — Encounter: Payer: Self-pay | Admitting: *Deleted

## 2015-05-19 NOTE — Progress Notes (Signed)
msg sent to scheduling per Dr. Rogue Bussing. Apt on 05/20/15 can be cnl as patient is now with hospice.

## 2015-05-20 ENCOUNTER — Inpatient Hospital Stay: Payer: Commercial Managed Care - HMO | Admitting: Internal Medicine

## 2015-06-04 ENCOUNTER — Ambulatory Visit: Payer: Medicaid Other | Admitting: Family Medicine

## 2015-06-04 ENCOUNTER — Ambulatory Visit (INDEPENDENT_AMBULATORY_CARE_PROVIDER_SITE_OTHER): Payer: Medicaid Other | Admitting: Family Medicine

## 2015-06-04 ENCOUNTER — Encounter: Payer: Self-pay | Admitting: Family Medicine

## 2015-06-04 VITALS — BP 116/73 | HR 96 | Temp 99.0°F | Resp 16 | Ht 64.0 in

## 2015-06-04 DIAGNOSIS — F1110 Opioid abuse, uncomplicated: Secondary | ICD-10-CM | POA: Diagnosis not present

## 2015-06-04 DIAGNOSIS — L03119 Cellulitis of unspecified part of limb: Secondary | ICD-10-CM | POA: Diagnosis not present

## 2015-06-04 DIAGNOSIS — F39 Unspecified mood [affective] disorder: Secondary | ICD-10-CM | POA: Diagnosis not present

## 2015-06-04 DIAGNOSIS — L03115 Cellulitis of right lower limb: Secondary | ICD-10-CM | POA: Diagnosis not present

## 2015-06-04 DIAGNOSIS — E039 Hypothyroidism, unspecified: Secondary | ICD-10-CM | POA: Diagnosis not present

## 2015-06-04 DIAGNOSIS — C50911 Malignant neoplasm of unspecified site of right female breast: Secondary | ICD-10-CM

## 2015-06-04 DIAGNOSIS — C7800 Secondary malignant neoplasm of unspecified lung: Secondary | ICD-10-CM

## 2015-06-04 DIAGNOSIS — J431 Panlobular emphysema: Secondary | ICD-10-CM

## 2015-06-04 MED ORDER — PANTOPRAZOLE SODIUM 40 MG PO TBEC: 40.0000 mg | DELAYED_RELEASE_TABLET | Freq: Every day | ORAL | Status: AC | PRN

## 2015-06-04 NOTE — Progress Notes (Signed)
Name: Betty Edwards   MRN: [id number]    DOB: 06/05/1945   Date:06/04/2015       Progress Note  Subjective  Chief Complaint  No chief complaint on file.   HPI Here for evaluation as requested by drug and alcohol services.  She is on Methadone.  No alcohol.  She is not on any meds for depression /anxiety other than what she ets from [facility]. ( Cymbalta and Buspar).  She has recently been hospitalized several timnes esp for cellulitis of her R foot.  She has lung cancer that is still progression and she says that Oncology has stopped all chemo now.  No more treatments.  She has started with Hospice and they have seen her at her home 3 times.  She states that she cannot get on exam tab le for full exam.  Also does not want any blood tests done at present.  She is still a code but is considering NO CODE status. No problem-specific assessment & plan notes found for this encounter.   Past Medical History  Diagnosis Date  . Breast cancer metastasized to lung ([place]) 09/07/2014  . 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 Hypertension   . MRSA (methicillin resistant Staphylococcus aureus)   . COPD (chronic obstructive pulmonary disease) ([place])   . Asthma     Past Surgical History  Procedure Laterality Date  . Mastectomy    . Cholecystectomy    . Cesarean section      Family History  Problem Relation Age of Onset  . Aneurysm Mother   . Diabetes Father     Social History   Social History  . Marital Status: Divorced    Spouse Name: N/A  . Number of Children: N/A  . Years of Education: N/A   Occupational History  . Not on file.   Social History Main Topics  . Smoking status: Current Every Day Smoker -- 1.00 packs/day for 46 years    Types: Cigarettes  . Smokeless tobacco: Never Used     Comment: smoked for 46 years  . Alcohol Use: No  . Drug Use: No  . Sexual Activity: Not on file   Other Topics Concern  . Not on file   Social History Narrative     Current  outpatient prescriptions:  .  albuterol (PROVENTIL HFA;VENTOLIN HFA) 108 (90 BASE) MCG/ACT inhaler, Inhale 2 puffs into the lungs every 6 (six) hours as needed for wheezing or shortness of breath., Disp: 1 Inhaler, Rfl: 2 .  atenolol (TENORMIN) 25 MG tablet, Take 0.5 tablets (12.5 mg total) by mouth daily., Disp: 45 tablet, Rfl: 3 .  busPIRone (BUSPAR) 10 MG tablet, Take 1 tablet (10 mg total) by mouth 3 (three) times daily., Disp: 90 tablet, Rfl: 6 .  diphenhydrAMINE (BENADRYL) 25 MG tablet, Take 1 tablet (25 mg total) by mouth every 8 (eight) hours as needed for itching., Disp: 90 tablet, Rfl: 0 .  DULoxetine (CYMBALTA) 30 MG capsule, Take 90 mg by mouth daily., Disp: , Rfl:  .  Fluticasone-Salmeterol (ADVAIR) 250-50 MCG/DOSE AEPB, Inhale 1 puff into the lungs 2 (two) times daily as needed (for shortness of breath.). Reported on 04/27/2015, Disp: , Rfl:  .  gabapentin (NEURONTIN) 800 MG tablet, TAKE 1 TABLET BY MOUTH 4 TIMES DAILY. MAXIMUM OF 4 TABLETS PER DAY., Disp: 120 tablet, Rfl: 6 .  levothyroxine (SYNTHROID, LEVOTHROID) 50 MCG tablet, TAKE 1 TABLET BY MOUTH ONCE DAILY ON AN EMPTY STOMACH. WAIT 30  MINUTES BEFORE TAKING OTHER MEDS., Disp: 30 tablet, Rfl: 6 .  methadone (DOLOPHINE) 10 MG/ML solution, Take 117 mg by mouth daily., Disp: , Rfl:  .  ondansetron (ZOFRAN) 4 MG tablet, Take 1 tablet (4 mg total) by mouth every 8 (eight) hours as needed for nausea or vomiting., Disp: 60 tablet, Rfl: 2 .  pantoprazole (PROTONIX) 40 MG tablet, Take 1 tablet (40 mg total) by mouth daily as needed (for acid reflux.)., Disp: 90 tablet, Rfl: 3  Allergies  Allergen Reactions  . Penicillins Shortness Of Breath, Itching and Other (See Comments)    Has patient had a PCN reaction causing immediate rash, facial/tongue/throat swelling, SOB or lightheadedness with hypotension: Yes Has patient had a PCN reaction causing severe rash involving mucus membranes or skin necrosis: No Has patient had a PCN reaction that  required hospitalization No Has patient had a PCN reaction occurring within the last 10 years: Yes If all of the above answers are "NO", then may proceed with Cephalosporin use.  . Ibuprofen Other (See Comments)    Reaction:  GI upset   . Prilosec [Omeprazole] Other (See Comments)    Reaction:  Involuntary muscle movements   . Tramadol Nausea Only  . Sulfa Antibiotics Itching and Rash     Review of Systems  Constitutional: Positive for weight loss and malaise/fatigue. Negative for fever and chills.  HENT: Negative for hearing loss.   Eyes: Negative for blurred vision and double vision.  Respiratory: Positive for cough, sputum production, shortness of breath and wheezing. Negative for hemoptysis.   Cardiovascular: Negative for chest pain, palpitations and leg swelling.  Gastrointestinal: Negative for heartburn, abdominal pain and blood in stool.  Genitourinary: Negative for dysuria, urgency and frequency.  Musculoskeletal: Positive for back pain and joint pain.  Skin: Positive for rash.  Neurological: Positive for weakness. Negative for tremors and headaches.  Psychiatric/Behavioral: Positive for depression.      Objective  Filed Vitals:   06/04/15 1006  BP: 116/73  Pulse: 105  Temp: 99 F (37.2 C)  TempSrc: Oral  Resp: 16  Height: 5' 4"  (1.626 m)    Physical Exam  Constitutional: She is oriented to person, place, and time.  Cachetic female sitting in wheelchair.  HENT:  Head: Normocephalic and atraumatic.  Nose: Nose normal.  Mouth/Throat: Oropharynx is clear and moist.  Eyes: Conjunctivae and EOM are normal. Pupils are equal, round, and reactive to light. No scleral icterus.  Neck: Normal range of motion. Neck supple. Carotid bruit is present (bilateral). No thyromegaly present.  Cardiovascular: Normal rate and regular rhythm.  Exam reveals no gallop and no friction rub.   Murmur heard.  Systolic murmur is present with a grade of 2/6  thrioughout    Pulmonary/Chest: Effort normal. No respiratory distress. She has no wheezes. She has no rales.    Decreased breath sounds throughout.  Abdominal: Soft. Bowel sounds are normal. She exhibits no distension and no mass. There is no tenderness.  Musculoskeletal: She exhibits no edema.  Deformity (chroncx) of R leg  Lymphadenopathy:    She has no cervical adenopathy.  Neurological: She is alert and oriented to person, place, and time.  Skin:  Multiple excoriated areas of bilateral legs with minimal redness around scabs.  No true cellulitis.    Psychiatric:  Depressed affect.  Vitals reviewed.  More through Physical exam could not be done because of patients limited ability of movement    Recent Results (from the past 2160 hour(s))  CBC with  Differential/Platelet     Status: Abnormal   Collection Time: 03/06/15  3:07 PM  Result Value Ref Range   WBC 3.2 (L) 3.6 - 11.0 K/uL   RBC 3.57 (L) 3.80 - 5.20 MIL/uL   Hemoglobin 10.1 (L) 12.0 - 16.0 g/dL   HCT 32.3 (L) 35.0 - 47.0 %   MCV 90.4 80.0 - 100.0 fL   MCH 28.3 26.0 - 34.0 pg   MCHC 31.3 (L) 32.0 - 36.0 g/dL   RDW 18.6 (H) 11.5 - 14.5 %   Platelets 144 (L) 150 - 440 K/uL   Neutrophils Relative % 35 %   Neutro Abs 1.1 (L) 1.4 - 6.5 K/uL   Lymphocytes Relative 50 %   Lymphs Abs 1.6 1.0 - 3.6 K/uL   Monocytes Relative 11 %   Monocytes Absolute 0.4 0.2 - 0.9 K/uL   Eosinophils Relative 3 %   Eosinophils Absolute 0.1 0 - 0.7 K/uL   Basophils Relative 1 %   Basophils Absolute 0.0 0 - 0.1 K/uL  Comprehensive metabolic panel     Status: Abnormal   Collection Time: 03/06/15  3:07 PM  Result Value Ref Range   Sodium 139 135 - 145 mmol/L   Potassium 3.6 3.5 - 5.1 mmol/L   Chloride 105 101 - 111 mmol/L   CO2 29 22 - 32 mmol/L   Glucose, Bld 61 (L) 65 - 99 mg/dL   BUN 10 6 - 20 mg/dL   Creatinine, Ser 0.63 0.44 - 1.00 mg/dL   Calcium 8.4 (L) 8.9 - 10.3 mg/dL   Total Protein 6.3 (L) 6.5 - 8.1 g/dL   Albumin 3.3 (L) 3.5 - 5.0 g/dL    AST 14 (L) 15 - 41 U/L   ALT 9 (L) 14 - 54 U/L   Alkaline Phosphatase 82 38 - 126 U/L   Total Bilirubin 0.4 0.3 - 1.2 mg/dL   GFR calc non Af Amer >60 >60 mL/min   GFR calc Af Amer >60 >60 mL/min    Comment: (NOTE) The eGFR has been calculated using the CKD EPI equation. This calculation has not been validated in all clinical situations. eGFR's persistently <60 mL/min signify possible Chronic Kidney Disease.    Anion gap 5 5 - 15  CBC with Differential/Platelet     Status: Abnormal   Collection Time: 03/20/15  2:08 PM  Result Value Ref Range   WBC 4.0 3.6 - 11.0 K/uL   RBC 3.46 (L) 3.80 - 5.20 MIL/uL   Hemoglobin 9.9 (L) 12.0 - 16.0 g/dL   HCT 31.5 (L) 35.0 - 47.0 %   MCV 91.1 80.0 - 100.0 fL   MCH 28.6 26.0 - 34.0 pg   MCHC 31.4 (L) 32.0 - 36.0 g/dL   RDW 19.5 (H) 11.5 - 14.5 %   Platelets 235 150 - 440 K/uL   Neutrophils Relative % 45 %   Neutro Abs 1.8 1.4 - 6.5 K/uL   Lymphocytes Relative 48 %   Lymphs Abs 1.9 1.0 - 3.6 K/uL   Monocytes Relative 4 %   Monocytes Absolute 0.2 0.2 - 0.9 K/uL   Eosinophils Relative 2 %   Eosinophils Absolute 0.1 0 - 0.7 K/uL   Basophils Relative 1 %   Basophils Absolute 0.0 0 - 0.1 K/uL  Comprehensive metabolic panel     Status: Abnormal   Collection Time: 03/20/15  2:08 PM  Result Value Ref Range   Sodium 138 135 - 145 mmol/L   Potassium 3.6 3.5 - 5.1 mmol/L  Chloride 102 101 - 111 mmol/L   CO2 29 22 - 32 mmol/L   Glucose, Bld 81 65 - 99 mg/dL   BUN 13 6 - 20 mg/dL   Creatinine, Ser 0.66 0.44 - 1.00 mg/dL   Calcium 8.5 (L) 8.9 - 10.3 mg/dL   Total Protein 6.5 6.5 - 8.1 g/dL   Albumin 3.1 (L) 3.5 - 5.0 g/dL   AST 15 15 - 41 U/L   ALT 8 (L) 14 - 54 U/L   Alkaline Phosphatase 82 38 - 126 U/L   Total Bilirubin 0.5 0.3 - 1.2 mg/dL   GFR calc non Af Amer >60 >60 mL/min   GFR calc Af Amer >60 >60 mL/min    Comment: (NOTE) The eGFR has been calculated using the CKD EPI equation. This calculation has not been validated in all  clinical situations. eGFR's persistently <60 mL/min signify possible Chronic Kidney Disease.    Anion gap 7 5 - 15  Cancer antigen 27.29     Status: Abnormal   Collection Time: 03/20/15  2:08 PM  Result Value Ref Range   CA 27.29 259.6 (H) 0.0 - 38.6 U/mL    Comment: (NOTE) [employer] [facility]/ACS methodology Performed At: [facility] [place], [place] [id number] Lindon Romp MD [id number]   CBC with Differential     Status: Abnormal   Collection Time: 03/22/15  6:43 PM  Result Value Ref Range   WBC 3.2 (L) 3.6 - 11.0 K/uL   RBC 3.35 (L) 3.80 - 5.20 MIL/uL   Hemoglobin 9.8 (L) 12.0 - 16.0 g/dL   HCT 31.1 (L) 35.0 - 47.0 %   MCV 92.8 80.0 - 100.0 fL   MCH 29.2 26.0 - 34.0 pg   MCHC 31.5 (L) 32.0 - 36.0 g/dL   RDW 19.8 (H) 11.5 - 14.5 %   Platelets 169 150 - 440 K/uL   Neutrophils Relative % 48 %   Neutro Abs 1.5 1.4 - 6.5 K/uL   Lymphocytes Relative 42 %   Lymphs Abs 1.3 1.0 - 3.6 K/uL   Monocytes Relative 6 %   Monocytes Absolute 0.2 0.2 - 0.9 K/uL   Eosinophils Relative 3 %   Eosinophils Absolute 0.1 0 - 0.7 K/uL   Basophils Relative 1 %   Basophils Absolute 0.0 0 - 0.1 K/uL  Basic metabolic panel     Status: Abnormal   Collection Time: 03/22/15  6:43 PM  Result Value Ref Range   Sodium 138 135 - 145 mmol/L   Potassium 3.8 3.5 - 5.1 mmol/L   Chloride 105 101 - 111 mmol/L   CO2 28 22 - 32 mmol/L   Glucose, Bld 96 65 - 99 mg/dL   BUN 13 6 - 20 mg/dL   Creatinine, Ser 0.69 0.44 - 1.00 mg/dL   Calcium 8.7 (L) 8.9 - 10.3 mg/dL   GFR calc non Af Amer >60 >60 mL/min   GFR calc Af Amer >60 >60 mL/min    Comment: (NOTE) The eGFR has been calculated using the CKD EPI equation. This calculation has not been validated in all clinical situations. eGFR's persistently <60 mL/min signify possible Chronic Kidney Disease.    Anion gap 5 5 - 15  Culture, blood (routine x 2)     Status: None   Collection Time: 03/22/15  7:22 PM  Result Value Ref Range     Specimen Description BLOOD LEFT HAND    Special Requests      BOTTLES DRAWN AEROBIC AND  ANAEROBIC 5 CC AEROBIC, 3 CC ANAEROBIC   Culture NO GROWTH 5 DAYS    Report Status 03/27/2015 FINAL   Culture, blood (routine x 2)     Status: None   Collection Time: 03/22/15  7:22 PM  Result Value Ref Range   Specimen Description BLOOD LEFT ASSIST CONTROL    Special Requests BOTTLES DRAWN AEROBIC AND ANAEROBIC 5 CC    Culture NO GROWTH 5 DAYS    Report Status 03/27/2015 FINAL   Basic metabolic panel     Status: Abnormal   Collection Time: 03/23/15  5:36 AM  Result Value Ref Range   Sodium 138 135 - 145 mmol/L   Potassium 4.0 3.5 - 5.1 mmol/L   Chloride 106 101 - 111 mmol/L   CO2 29 22 - 32 mmol/L   Glucose, Bld 85 65 - 99 mg/dL   BUN 10 6 - 20 mg/dL   Creatinine, Ser 0.63 0.44 - 1.00 mg/dL   Calcium 8.5 (L) 8.9 - 10.3 mg/dL   GFR calc non Af Amer >60 >60 mL/min   GFR calc Af Amer >60 >60 mL/min    Comment: (NOTE) The eGFR has been calculated using the CKD EPI equation. This calculation has not been validated in all clinical situations. eGFR's persistently <60 mL/min signify possible Chronic Kidney Disease.    Anion gap 3 (L) 5 - 15  CBC     Status: Abnormal   Collection Time: 03/23/15  5:36 AM  Result Value Ref Range   WBC 3.1 (L) 3.6 - 11.0 K/uL   RBC 3.37 (L) 3.80 - 5.20 MIL/uL   Hemoglobin 10.0 (L) 12.0 - 16.0 g/dL   HCT 31.2 (L) 35.0 - 47.0 %   MCV 92.6 80.0 - 100.0 fL   MCH 29.7 26.0 - 34.0 pg   MCHC 32.0 32.0 - 36.0 g/dL   RDW 19.9 (H) 11.5 - 14.5 %   Platelets 166 150 - 440 K/uL  CBC     Status: Abnormal   Collection Time: 04/10/15  2:06 PM  Result Value Ref Range   WBC 4.2 3.6 - 11.0 K/uL   RBC 3.60 (L) 3.80 - 5.20 MIL/uL   Hemoglobin 10.5 (L) 12.0 - 16.0 g/dL   HCT 34.0 (L) 35.0 - 47.0 %   MCV 94.4 80.0 - 100.0 fL   MCH 29.2 26.0 - 34.0 pg   MCHC 30.9 (L) 32.0 - 36.0 g/dL   RDW 21.0 (H) 11.5 - 14.5 %   Platelets 229 150 - 440 K/uL  Lactic acid, plasma      Status: None   Collection Time: 04/18/15  4:26 AM  Result Value Ref Range   Lactic Acid, Venous 0.7 0.5 - 2.0 mmol/L  Lactic acid, plasma     Status: None   Collection Time: 04/19/15  1:30 AM  Result Value Ref Range   Lactic Acid, Venous 2.0 0.5 - 2.0 mmol/L  Comprehensive metabolic panel     Status: Abnormal   Collection Time: 04/19/15  1:30 AM  Result Value Ref Range   Sodium 140 135 - 145 mmol/L   Potassium 3.2 (L) 3.5 - 5.1 mmol/L   Chloride 108 101 - 111 mmol/L   CO2 26 22 - 32 mmol/L   Glucose, Bld 82 65 - 99 mg/dL   BUN 13 6 - 20 mg/dL   Creatinine, Ser 0.59 0.44 - 1.00 mg/dL   Calcium 8.5 (L) 8.9 - 10.3 mg/dL   Total Protein 6.3 (L) 6.5 - 8.1 g/dL  Albumin 3.3 (L) 3.5 - 5.0 g/dL   AST 20 15 - 41 U/L   ALT 11 (L) 14 - 54 U/L   Alkaline Phosphatase 68 38 - 126 U/L   Total Bilirubin 0.6 0.3 - 1.2 mg/dL   GFR calc non Af Amer >60 >60 mL/min   GFR calc Af Amer >60 >60 mL/min    Comment: (NOTE) The eGFR has been calculated using the CKD EPI equation. This calculation has not been validated in all clinical situations. eGFR's persistently <60 mL/min signify possible Chronic Kidney Disease.    Anion gap 6 5 - 15  Lipase, blood     Status: None   Collection Time: 04/19/15  1:30 AM  Result Value Ref Range   Lipase 20 11 - 51 U/L  Troponin I     Status: None   Collection Time: 04/19/15  1:30 AM  Result Value Ref Range   Troponin I 0.03 <0.031 ng/mL    Comment:        NO INDICATION OF MYOCARDIAL INJURY.   CBC WITH DIFFERENTIAL     Status: Abnormal   Collection Time: 04/19/15  1:30 AM  Result Value Ref Range   WBC 1.7 (L) 3.6 - 11.0 K/uL   RBC 3.38 (L) 3.80 - 5.20 MIL/uL   Hemoglobin 10.0 (L) 12.0 - 16.0 g/dL   HCT 31.8 (L) 35.0 - 47.0 %   MCV 94.2 80.0 - 100.0 fL   MCH 29.5 26.0 - 34.0 pg   MCHC 31.3 (L) 32.0 - 36.0 g/dL   RDW 21.9 (H) 11.5 - 14.5 %   Platelets 105 (L) 150 - 440 K/uL   Neutrophils Relative % 67 %   Neutro Abs 1.2 (L) 1.4 - 6.5 K/uL    Lymphocytes Relative 23 %   Lymphs Abs 0.4 (L) 1.0 - 3.6 K/uL   Monocytes Relative 7 %   Monocytes Absolute 0.1 (L) 0.2 - 0.9 K/uL   Eosinophils Relative 3 %   Eosinophils Absolute 0.0 0 - 0.7 K/uL   Basophils Relative 0 %   Basophils Absolute 0.0 0 - 0.1 K/uL  APTT     Status: None   Collection Time: 04/19/15  1:30 AM  Result Value Ref Range   aPTT 35 24 - 36 seconds  Protime-INR     Status: Abnormal   Collection Time: 04/19/15  1:30 AM  Result Value Ref Range   Prothrombin Time 15.1 (H) 11.4 - 15.0 seconds   INR 1.17   Blood Culture (routine x 2)     Status: None   Collection Time: 04/19/15  1:30 AM  Result Value Ref Range   Specimen Description BLOOD LEFT ARM    Special Requests BOTTLES DRAWN AEROBIC AND ANAEROBIC 3CC,4CC    Culture NO GROWTH 5 DAYS    Report Status 04/24/2015 FINAL   Blood Culture (routine x 2)     Status: None   Collection Time: 04/19/15  1:31 AM  Result Value Ref Range   Specimen Description BLOOD LEFT ASSIST CONTROL    Special Requests BOTTLES DRAWN AEROBIC AND ANAEROBIC 4CC    Culture NO GROWTH 5 DAYS    Report Status 04/24/2015 FINAL   Urinalysis complete, with microscopic ([facility] only)     Status: Abnormal   Collection Time: 04/19/15  1:31 AM  Result Value Ref Range   Color, Urine YELLOW (A) YELLOW   APPearance CLEAR (A) CLEAR   Glucose, UA NEGATIVE NEGATIVE mg/dL   Bilirubin Urine NEGATIVE NEGATIVE  Ketones, ur NEGATIVE NEGATIVE mg/dL   Specific Gravity, Urine 1.040 (H) 1.005 - 1.030   Hgb urine dipstick NEGATIVE NEGATIVE   pH 5.0 5.0 - 8.0   Protein, ur NEGATIVE NEGATIVE mg/dL   Nitrite NEGATIVE NEGATIVE   Leukocytes, UA NEGATIVE NEGATIVE   RBC / HPF 0-5 0 - 5 RBC/hpf   WBC, UA 0-5 0 - 5 WBC/hpf   Bacteria, UA NONE SEEN NONE SEEN   Squamous Epithelial / LPF 0-5 (A) NONE SEEN   Mucous PRESENT   Urine culture     Status: None   Collection Time: 04/19/15  1:31 AM  Result Value Ref Range   Specimen Description URINE, RANDOM    Special  Requests NONE    Culture NO GROWTH 1 DAY    Report Status 04/20/2015 FINAL   CBC     Status: Abnormal   Collection Time: 04/19/15  5:57 AM  Result Value Ref Range   WBC 2.2 (L) 3.6 - 11.0 K/uL   RBC 2.78 (L) 3.80 - 5.20 MIL/uL   Hemoglobin 8.2 (L) 12.0 - 16.0 g/dL   HCT 26.6 (L) 35.0 - 47.0 %   MCV 95.8 80.0 - 100.0 fL   MCH 29.5 26.0 - 34.0 pg   MCHC 30.8 (L) 32.0 - 36.0 g/dL   RDW 21.4 (H) 11.5 - 14.5 %   Platelets 81 (L) 150 - 440 K/uL  Basic metabolic panel     Status: Abnormal   Collection Time: 04/19/15  5:57 AM  Result Value Ref Range   Sodium 138 135 - 145 mmol/L   Potassium 3.7 3.5 - 5.1 mmol/L   Chloride 110 101 - 111 mmol/L   CO2 25 22 - 32 mmol/L   Glucose, Bld 102 (H) 65 - 99 mg/dL   BUN 10 6 - 20 mg/dL   Creatinine, Ser 0.65 0.44 - 1.00 mg/dL   Calcium 7.6 (L) 8.9 - 10.3 mg/dL   GFR calc non Af Amer >60 >60 mL/min   GFR calc Af Amer >60 >60 mL/min    Comment: (NOTE) The eGFR has been calculated using the CKD EPI equation. This calculation has not been validated in all clinical situations. eGFR's persistently <60 mL/min signify possible Chronic Kidney Disease.    Anion gap 3 (L) 5 - 15  Lactic acid, plasma     Status: None   Collection Time: 04/19/15 12:35 PM  Result Value Ref Range   Lactic Acid, Venous 0.9 0.5 - 2.0 mmol/L     Assessment & Plan  Problem List Items Addressed This Visit    None      Meds ordered this encounter  Medications  . DISCONTD: fentaNYL (DURAGESIC - DOSED MCG/HR) 12 MCG/HR    Sig: Place 12 mcg onto the skin as directed. Reported on 06/04/2015  . pantoprazole (PROTONIX) 40 MG tablet    Sig: Take 1 tablet (40 mg total) by mouth daily as needed (for acid reflux.).    Dispense:  90 tablet    Refill:  3

## 2015-06-04 NOTE — Patient Instructions (Signed)
Dr. Reece Levy from Hospice will take over primary care and control all meds.

## 2015-06-17 ENCOUNTER — Emergency Department
Admission: EM | Admit: 2015-06-17 | Discharge: 2015-06-18 | Disposition: A | Discharge status: Home / Self Care | Payer: Commercial Managed Care - HMO | Attending: Emergency Medicine | Admitting: Emergency Medicine

## 2015-06-17 ENCOUNTER — Emergency Department: Payer: Commercial Managed Care - HMO

## 2015-06-17 ENCOUNTER — Encounter: Payer: Self-pay | Admitting: Emergency Medicine

## 2015-06-17 DIAGNOSIS — R079 Chest pain, unspecified: Secondary | ICD-10-CM | POA: Diagnosis present

## 2015-06-17 DIAGNOSIS — I2510 Atherosclerotic heart disease of native coronary artery without angina pectoris: Secondary | ICD-10-CM | POA: Diagnosis not present

## 2015-06-17 DIAGNOSIS — Z880 Allergy status to penicillin: Secondary | ICD-10-CM | POA: Insufficient documentation

## 2015-06-17 DIAGNOSIS — Z79891 Long term (current) use of opiate analgesic: Secondary | ICD-10-CM | POA: Insufficient documentation

## 2015-06-17 DIAGNOSIS — I10 Essential (primary) hypertension: Secondary | ICD-10-CM | POA: Diagnosis not present

## 2015-06-17 DIAGNOSIS — Z79899 Other long term (current) drug therapy: Secondary | ICD-10-CM | POA: Insufficient documentation

## 2015-06-17 DIAGNOSIS — R0789 Other chest pain: Secondary | ICD-10-CM | POA: Insufficient documentation

## 2015-06-17 DIAGNOSIS — F17210 Nicotine dependence, cigarettes, uncomplicated: Secondary | ICD-10-CM | POA: Diagnosis not present

## 2015-06-17 DIAGNOSIS — J441 Chronic obstructive pulmonary disease with (acute) exacerbation: Secondary | ICD-10-CM | POA: Insufficient documentation

## 2015-06-17 DIAGNOSIS — G8929 Other chronic pain: Secondary | ICD-10-CM | POA: Diagnosis not present

## 2015-06-17 MED ORDER — OXYCODONE-ACETAMINOPHEN 5-325 MG PO TABS
1.0000 | ORAL_TABLET | Freq: Once | ORAL | Status: AC
  Administered 2015-06-17: 1 via ORAL
  Filled 2015-06-17: qty 1

## 2015-06-17 MED ORDER — HYDROCODONE-ACETAMINOPHEN 5-325 MG PO TABS
1.0000 | ORAL_TABLET | Freq: Once | ORAL | Status: AC
  Administered 2015-06-17: 1 via ORAL
  Filled 2015-06-17: qty 1

## 2015-06-17 MED ORDER — OXYCODONE-ACETAMINOPHEN 5-325 MG PO TABS
1.0000 | ORAL_TABLET | ORAL | Status: AC | PRN
Start: 2015-06-17 — End: ?

## 2015-06-17 NOTE — Discharge Instructions (Signed)
Please seek medical attention for any high fevers, chest pain, shortness of breath, change in behavior, persistent vomiting, bloody stool or any other new or concerning symptoms. ° ° °Chest Wall Pain °Chest wall pain is pain in or around the bones and muscles of your chest. Sometimes, an injury causes this pain. Sometimes, the cause may not be known. This pain may take several weeks or longer to get better. °HOME CARE INSTRUCTIONS  °Pay attention to any changes in your symptoms. Take these actions to help with your pain:  °· Rest as told by your health care provider.   °· Avoid activities that cause pain. These include any activities that use your chest muscles or your abdominal and side muscles to lift heavy items.    °· If directed, apply ice to the painful area: °¨ Put ice in a plastic bag. °¨ Place a towel between your skin and the bag. °¨ Leave the ice on for 20 minutes, 2-3 times per day. °· Take over-the-counter and prescription medicines only as told by your health care provider. °· Do not use tobacco products, including cigarettes, chewing tobacco, and e-cigarettes. If you need help quitting, ask your health care provider. °· Keep all follow-up visits as told by your health care provider. This is important. °SEEK MEDICAL CARE IF: °· You have a fever. °· Your chest pain becomes worse. °· You have new symptoms. °SEEK IMMEDIATE MEDICAL CARE IF: °· You have nausea or vomiting. °· You feel sweaty or light-headed. °· You have a cough with phlegm (sputum) or you cough up blood. °· You develop shortness of breath. °  °This information is not intended to replace advice given to you by your health care provider. Make sure you discuss any questions you have with your health care provider. °  °Document Released: 04/25/2005 Document Revised: 01/14/2015 Document Reviewed: 07/21/2014 °[employer]vier Interactive Patient Education ©2016 [employer] ° °

## 2015-06-17 NOTE — ED Provider Notes (Signed)
[facility] Emergency Department Provider Note   ____________________________________________  Time seen: On EMS arrival  I have reviewed the triage vital signs and the nursing notes.   HISTORY  Chief Complaint Shortness of Breath   History limited by: Not Limited   HPI Betty Edwards is a 70 y.o. female with history of lung cancer, on hospice, who presents to the emergency department today because of shortness of breath and right-sided chest pain. The patient states she called EMS because she was experiencing some shortness of breath however this resolved on its own by the time EMS arrived. Patient states that she additionally has been having some worsening right rib pain. She states she has chronic pain in her right rib. She states that it has gotten worse over the past couple of days. She denies any trauma to that side of her chest wall. She states that it is severe. It is not helped by the gabapentin that she has been prescribed. The patient states that she has had a dry cough. Denies any fevers.     Past Medical History  Diagnosis Date  . Breast cancer metastasized to lung ([place]) 09/07/2014  . 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 Hypertension   . MRSA (methicillin resistant Staphylococcus aureus)   . COPD (chronic obstructive pulmonary disease) ([place])   . Asthma     Patient Active Problem List   Diagnosis Date Noted  . Hypokalemia 04/19/2015  . HTN (hypertension), benign 04/19/2015  . Cellulitis of right leg 03/22/2015  . Cellulitis 03/03/2015  . 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 Fever 09/08/2014  . Fever presenting with conditions classified elsewhere 09/07/2014  . SIRS (systemic inflammatory response syndrome) ([place]) 09/07/2014  . Breast cancer metastasized to lung ([place]) 09/07/2014  . Hypertension 09/06/2014  . Arteriosclerosis of coronary artery 05/28/2013  . Chest pain 05/28/2013  . CAFL (chronic  airflow limitation) ([place]) 05/28/2013  . Adult hypothyroidism 05/28/2013  . Affective disorder ([place]) 05/28/2013  . Drug abuse, opioid type 05/28/2013    Past Surgical History  Procedure Laterality Date  . Mastectomy    . Cholecystectomy    . Cesarean section      Current Outpatient Rx  Name  Route  Sig  Dispense  Refill  . albuterol (PROVENTIL HFA;VENTOLIN HFA) 108 (90 BASE) MCG/ACT inhaler   Inhalation   Inhale 2 puffs into the lungs every 6 (six) hours as needed for wheezing or shortness of breath.   1 Inhaler   2   . atenolol (TENORMIN) 25 MG tablet   Oral   Take 0.5 tablets (12.5 mg total) by mouth daily.   45 tablet   3   . busPIRone (BUSPAR) 10 MG tablet   Oral   Take 1 tablet (10 mg total) by mouth 3 (three) times daily.   90 tablet   6   . diphenhydrAMINE (BENADRYL) 25 MG tablet   Oral   Take 1 tablet (25 mg total) by mouth every 8 (eight) hours as needed for itching.   90 tablet   0   . DULoxetine (CYMBALTA) 30 MG capsule   Oral   Take 90 mg by mouth daily.         . Fluticasone-Salmeterol (ADVAIR) 250-50 MCG/DOSE AEPB   Inhalation   Inhale 1 puff into the lungs 2 (two) times daily as needed (for shortness of breath.). Reported on 04/27/2015         . gabapentin (  NEURONTIN) 800 MG tablet      TAKE 1 TABLET BY MOUTH 4 TIMES DAILY. MAXIMUM OF 4 TABLETS PER DAY.   120 tablet   6   . levothyroxine (SYNTHROID, LEVOTHROID) 50 MCG tablet      TAKE 1 TABLET BY MOUTH ONCE DAILY ON AN EMPTY STOMACH. WAIT 30 MINUTES BEFORE TAKING OTHER MEDS.   30 tablet   6   . methadone (DOLOPHINE) 10 MG/ML solution   Oral   Take 117 mg by mouth daily.         . ondansetron (ZOFRAN) 4 MG tablet   Oral   Take 1 tablet (4 mg total) by mouth every 8 (eight) hours as needed for nausea or vomiting.   60 tablet   2   . pantoprazole (PROTONIX) 40 MG tablet   Oral   Take 1 tablet (40 mg total) by mouth daily as needed (for acid reflux.).   90 tablet   3      Allergies Penicillins; Ibuprofen; Prilosec; Tramadol; and Sulfa antibiotics  Family History  Problem Relation Age of Onset  . Aneurysm Mother   . Diabetes Father     Social History Social History  Substance Use Topics  . Smoking status: Current Every Day Smoker -- 1.00 packs/day for 46 years    Types: Cigarettes  . Smokeless tobacco: Never Used     Comment: smoked for 46 years  . Alcohol Use: No    Review of Systems  Constitutional: Negative for fever. Cardiovascular: Positive for right-sided chest pain.  Respiratory: Positive for shortness of breath. Gastrointestinal: Negative for abdominal pain, vomiting and diarrhea. Neurological: Negative for headaches, focal weakness or numbness.  10-point ROS otherwise negative.  ____________________________________________   PHYSICAL EXAM:  VITAL SIGNS: ED Triage Vitals  Enc Vitals Group     BP 06/17/15 2107 102/66 mmHg     Pulse Rate 06/17/15 2107 99     Resp 06/17/15 2107 16     Temp 06/17/15 2107 98.8 F (37.1 C)     Temp Source 06/17/15 2107 Oral     SpO2 06/17/15 2107 100 %     Weight --      Height --      Head Cir --      Peak Flow --      Pain Score 06/17/15 2110 10   Constitutional: Alert and oriented. Well appearing and in no distress. Eyes: Conjunctivae are normal. PERRL. Normal extraocular movements. ENT   Head: Normocephalic and atraumatic.   Nose: No congestion/rhinnorhea.   Mouth/Throat: Mucous membranes are moist.   Neck: No stridor. Hematological/Lymphatic/Immunilogical: No cervical lymphadenopathy. Cardiovascular: Normal rate, regular rhythm.  No murmurs, rubs, or gallops. Respiratory: Normal respiratory effort without tachypnea nor retractions. Breath sounds are clear and equal bilaterally. No wheezes/rales/rhonchi. Gastrointestinal: Soft and nontender. No distention.  Genitourinary: Deferred Musculoskeletal: Normal range of motion in all extremities. No joint effusions.  No  lower extremity tenderness nor edema. Neurologic:  Normal speech and language. No gross focal neurologic deficits are appreciated.  Skin:  Skin is warm, dry and intact. No rash noted. Psychiatric: Mood and affect are normal. Speech and behavior are normal. Patient exhibits appropriate insight and judgment.  ____________________________________________    LABS (pertinent positives/negatives)  Labs pending ____________________________________________   EKG  Apolonio Schneiders, attending physician, personally viewed and interpreted this EKG  EKG Time: 2116 Rate: 98 Rhythm: normal sinus rhythm Axis: normal Intervals: qtc 540 QRS: incomplete left bundle branch block, LVH ST changes:  no st elevation Impression: abnormal ekg ____________________________________________    RADIOLOGY  CXR  IMPRESSION: 1. Right basilar and mid lung masses again noted, better characterized on prior CTA. No definite superimposed pneumonia seen. The masses do extend to the pleural on prior CTA, likely explaining the patient's right rib pain. Elevation of the right hemidiaphragm. 2. Mild left midlung opacity is nonspecific and may simply reflect atelectasis.  ____________________________________________   PROCEDURES  Procedure(s) performed: None  Critical Care performed: No  ____________________________________________   INITIAL IMPRESSION / ASSESSMENT AND PLAN / ED COURSE  Pertinent labs & imaging results that were available during my care of the patient were reviewed by me and considered in my medical decision making (see chart for details).  Patient presented to the emergency department today because of concerns for right chest pain. Appears this is acute on chronic pain likely secondary to her cancer. Will get chest x-ray and blood work.  Blood work pending at time of sign out. Will prepare paperwork indicates it is within normal  limits.  ____________________________________________   FINAL CLINICAL IMPRESSION(S) / ED DIAGNOSES  Final diagnoses:  Chest wall pain     Nance Pear, MD 06/18/15 1402

## 2015-06-17 NOTE — ED Notes (Signed)
Patient transported to X-ray 

## 2015-06-17 NOTE — ED Notes (Addendum)
Pt arrived to ED via EMS with c/o SOB and right ribs pain, onset this morning. Pt denies injury. Hx lung cancer on hospice. EMS reports pt found sitting in a chair smoking, SpO2-95% on room air with lungs clear. Pt alerts and oriented x4 at this time.

## 2015-06-18 DIAGNOSIS — R0789 Other chest pain: Secondary | ICD-10-CM | POA: Diagnosis not present

## 2015-06-18 LAB — BASIC METABOLIC PANEL
Anion gap: 11 (ref 5–15)
BUN: 16 mg/dL (ref 6–20)
CALCIUM: 8.5 mg/dL — AB (ref 8.9–10.3)
CO2: 27 mmol/L (ref 22–32)
CREATININE: 0.69 mg/dL (ref 0.44–1.00)
Chloride: 103 mmol/L (ref 101–111)
GFR calc Af Amer: >60 mL/min (ref >60)
GLUCOSE: 89 mg/dL (ref 65–99)
POTASSIUM: 3.5 mmol/L (ref 3.5–5.1)
SODIUM: 141 mmol/L (ref 135–145)

## 2015-06-18 LAB — CBC
HEMATOCRIT: 35.8 % (ref 35.0–47.0)
HEMOGLOBIN: 11.6 g/dL — AB (ref 12.0–16.0)
MCH: 32.1 pg (ref 26.0–34.0)
MCHC: 32.4 g/dL (ref 32.0–36.0)
MCV: 99.3 fL (ref 80.0–100.0)
Platelets: 247 10*3/uL (ref 150–440)
RBC: 3.61 MIL/uL — AB (ref 3.80–5.20)
RDW: 22.8 % — ABNORMAL HIGH (ref 11.5–14.5)
WBC: 10.8 10*3/uL (ref 3.6–11.0)

## 2015-06-18 MED ORDER — ONDANSETRON 4 MG PO TBDP
4.0000 mg | ORAL_TABLET | Freq: Once | ORAL | Status: AC
  Administered 2015-06-18: 4 mg via ORAL

## 2015-06-18 MED ORDER — ONDANSETRON 4 MG PO TBDP
ORAL_TABLET | ORAL | Status: AC
  Administered 2015-06-18: 4 mg via ORAL
  Filled 2015-06-18: qty 1

## 2015-07-02 ENCOUNTER — Encounter: Payer: Self-pay | Admitting: Emergency Medicine

## 2015-07-02 ENCOUNTER — Emergency Department
Admission: EM | Admit: 2015-07-02 | Discharge: 2015-07-02 | Discharge status: Against Medical Advice | Attending: Emergency Medicine | Admitting: Emergency Medicine

## 2015-07-02 DIAGNOSIS — L989 Disorder of the skin and subcutaneous tissue, unspecified: Secondary | ICD-10-CM | POA: Insufficient documentation

## 2015-07-02 DIAGNOSIS — C7800 Secondary malignant neoplasm of unspecified lung: Secondary | ICD-10-CM | POA: Diagnosis not present

## 2015-07-02 DIAGNOSIS — H5712 Ocular pain, left eye: Secondary | ICD-10-CM | POA: Diagnosis not present

## 2015-07-02 DIAGNOSIS — I10 Essential (primary) hypertension: Secondary | ICD-10-CM | POA: Insufficient documentation

## 2015-07-02 DIAGNOSIS — Z008 Encounter for other general examination: Secondary | ICD-10-CM | POA: Diagnosis present

## 2015-07-02 DIAGNOSIS — Z79899 Other long term (current) drug therapy: Secondary | ICD-10-CM | POA: Diagnosis not present

## 2015-07-02 DIAGNOSIS — H02842 Edema of right lower eyelid: Secondary | ICD-10-CM | POA: Diagnosis not present

## 2015-07-02 DIAGNOSIS — Z880 Allergy status to penicillin: Secondary | ICD-10-CM | POA: Insufficient documentation

## 2015-07-02 DIAGNOSIS — F17210 Nicotine dependence, cigarettes, uncomplicated: Secondary | ICD-10-CM | POA: Diagnosis not present

## 2015-07-02 DIAGNOSIS — Z79891 Long term (current) use of opiate analgesic: Secondary | ICD-10-CM | POA: Insufficient documentation

## 2015-07-02 DIAGNOSIS — C50919 Malignant neoplasm of unspecified site of unspecified female breast: Secondary | ICD-10-CM | POA: Insufficient documentation

## 2015-07-02 DIAGNOSIS — R079 Chest pain, unspecified: Secondary | ICD-10-CM | POA: Diagnosis not present

## 2015-07-02 MED ORDER — PERMETHRIN 5 % EX CREA
TOPICAL_CREAM | Freq: Once | CUTANEOUS | Status: DC
  Filled 2015-07-02: qty 60

## 2015-07-02 NOTE — ED Notes (Signed)
Hospice nurse requesting medical clearance so that pt may spend the weekend in respite care at the hospice home

## 2015-07-02 NOTE — ED Provider Notes (Signed)
[facility] Emergency Department Provider Note    ____________________________________________  Time seen: ~1625  I have reviewed the triage vital signs and the nursing notes.   HISTORY  Chief Complaint Poor Circulation and Medical Clearance   History limited by: Not Limited   HPI Betty Edwards is a 70 y.o. female who presents to the emergency department today because of hospice concern for swelling around her eye and on pain. Patient has lung cancer which she states is terminal. She apparently was first noticed to have left eye swelling today by hospice nurse. The patient also is complaining of some left chest wall pain which is slightly different than her normal right chest wall pain the patient would not give me any further history, being upset at the wait time. She stated she did not want any evaluation at this point simply wanted transport back home.     Past Medical History  Diagnosis Date  . Breast cancer metastasized to lung ([place]) 09/07/2014  . 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 Hypertension   . MRSA (methicillin resistant Staphylococcus aureus)   . COPD (chronic obstructive pulmonary disease) ([place])   . Asthma     Patient Active Problem List   Diagnosis Date Noted  . Hypokalemia 04/19/2015  . HTN (hypertension), benign 04/19/2015  . Cellulitis of right leg 03/22/2015  . Cellulitis 03/03/2015  . 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 Fever 09/08/2014  . Fever presenting with conditions classified elsewhere 09/07/2014  . SIRS (systemic inflammatory response syndrome) ([place]) 09/07/2014  . Breast cancer metastasized to lung ([place]) 09/07/2014  . Hypertension 09/06/2014  . Arteriosclerosis of coronary artery 05/28/2013  . Chest pain 05/28/2013  . CAFL (chronic airflow limitation) ([place]) 05/28/2013  . Adult hypothyroidism 05/28/2013  . Affective disorder ([place]) 05/28/2013  . Drug abuse, opioid type  05/28/2013    Past Surgical History  Procedure Laterality Date  . Mastectomy    . Cholecystectomy    . Cesarean section      Current Outpatient Rx  Name  Route  Sig  Dispense  Refill  . albuterol (PROVENTIL HFA;VENTOLIN HFA) 108 (90 BASE) MCG/ACT inhaler   Inhalation   Inhale 2 puffs into the lungs every 6 (six) hours as needed for wheezing or shortness of breath.   1 Inhaler   2   . atenolol (TENORMIN) 25 MG tablet   Oral   Take 0.5 tablets (12.5 mg total) by mouth daily.   45 tablet   3   . busPIRone (BUSPAR) 10 MG tablet   Oral   Take 1 tablet (10 mg total) by mouth 3 (three) times daily.   90 tablet   6   . diphenhydrAMINE (BENADRYL) 25 MG tablet   Oral   Take 1 tablet (25 mg total) by mouth every 8 (eight) hours as needed for itching.   90 tablet   0   . DULoxetine (CYMBALTA) 30 MG capsule   Oral   Take 90 mg by mouth daily.         . Fluticasone-Salmeterol (ADVAIR) 250-50 MCG/DOSE AEPB   Inhalation   Inhale 1 puff into the lungs 2 (two) times daily as needed (for shortness of breath.). Reported on 04/27/2015         . gabapentin (NEURONTIN) 800 MG tablet      TAKE 1 TABLET BY MOUTH 4 TIMES DAILY. MAXIMUM OF 4 TABLETS PER DAY.   120 tablet   6   .  levothyroxine (SYNTHROID, LEVOTHROID) 50 MCG tablet      TAKE 1 TABLET BY MOUTH ONCE DAILY ON AN EMPTY STOMACH. WAIT 30 MINUTES BEFORE TAKING OTHER MEDS.   30 tablet   6   . methadone (DOLOPHINE) 10 MG/ML solution   Oral   Take 117 mg by mouth daily.         . ondansetron (ZOFRAN) 4 MG tablet   Oral   Take 1 tablet (4 mg total) by mouth every 8 (eight) hours as needed for nausea or vomiting.   60 tablet   2   . oxyCODONE-acetaminophen (ROXICET) 5-325 MG tablet   Oral   Take 1 tablet by mouth every 4 (four) hours as needed for severe pain.   15 tablet   0   . pantoprazole (PROTONIX) 40 MG tablet   Oral   Take 1 tablet (40 mg total) by mouth daily as needed (for acid reflux.).   90 tablet    3     Allergies Penicillins; Ibuprofen; Prilosec; Tramadol; and Sulfa antibiotics  Family History  Problem Relation Age of Onset  . Aneurysm Mother   . Diabetes Father     Social History Social History  Substance Use Topics  . Smoking status: Current Every Day Smoker -- 1.00 packs/day for 46 years    Types: Cigarettes  . Smokeless tobacco: Never Used     Comment: smoked for 46 years  . Alcohol Use: No    Review of Systems  Constitutional: Negative for fever. Cardiovascular: Positive for chest pain. Respiratory: Negative for shortness of breath. Gastrointestinal: Negative for abdominal pain, vomiting and diarrhea. Neurological: Negative for headaches, focal weakness or numbness.  10-point ROS otherwise negative.  ____________________________________________   PHYSICAL EXAM:  VITAL SIGNS: ED Triage Vitals  Enc Vitals Group     BP 07/02/15 1448 101/64 mmHg     Pulse Rate 07/02/15 1448 88     Resp 07/02/15 1448 18     Temp 07/02/15 1448 98 F (36.7 C)     Temp Source 07/02/15 1448 Oral     SpO2 07/02/15 1448 99 %     Weight 07/02/15 1448 130 lb (58.968 kg)     Height 07/02/15 1448 5\' 4"  (1.626 m)     Head Cir --      Peak Flow --      Pain Score 07/02/15 1450 0   Constitutional: awake and alert. Oriented. Slightly frustrated.  Eyes: Conjunctivae are normal. PERRL. Normal extraocular movements.  slight right lower eyelid swelling however no redness appreciated. ENT   Head: Normocephalic and atraumatic.   Nose: No congestion/rhinnorhea.   Neck: No stridor. Genitourinary: Deferred Musculoskeletal: Normal range of motion in all extremities. No joint effusions.  No lower extremity tenderness nor edema. Neurologic:  Normal speech and language. No gross focal neurologic deficits are appreciated.  Skin:  Skin is warm, dry. Multiple small scab-like lesions that could be consistent with insect bites. Psychiatric: Mood and affect are normal. Speech and  behavior are normal. Patient exhibits appropriate insight and judgment.  ____________________________________________    LABS (pertinent positives/negatives)  None  ____________________________________________   EKG  None  ____________________________________________    RADIOLOGY  None   ____________________________________________   PROCEDURES  Procedure(s) performed: None  Critical Care performed: No  ____________________________________________   INITIAL IMPRESSION / ASSESSMENT AND PLAN / ED COURSE  Pertinent labs & imaging results that were available during my care of the patient were reviewed by me and considered in my  medical decision making (see chart for details).  Patient presented to the emergency department today at the request of hospice nurse. Patient herself was quite frustrated by the time I was able to evaluate her and refused any evaluation by me. She refuses physical exam and did not give any significant history. Patient does have a history of terminal lung cancer. Patient states she wants to be discharge back home. Hospice nurse is aware.  ____________________________________________   FINAL CLINICAL IMPRESSION(S) / ED DIAGNOSES  Final diagnoses:  Chest pain, unspecified chest pain type     Nance Pear, MD 07/02/15 1705

## 2015-07-02 NOTE — ED Notes (Signed)
Family arrived to take pt home prior to ems arrival.  Pt alert.  Pt left with family.

## 2015-07-02 NOTE — Progress Notes (Signed)
ED visit made. Patient is currently followed by [facility] with a diagnosis of breast cancer with lung mets. She is a FULL CODE. Patient sent to the ED from home for evaluation of facial swelling and left sided thoracic pain on inspiration and oxygen saturations of 81% on room air, patient does not use/require oxygen at home. Patient seen sitting up on the ED stretcher, upset about having to wait and wanting to got to the bathroom. Staff RN Clarene Critchley in during visit, patient helped to the toilet, unable to urinate. She remained insistent on going home and declined any work up both to Probation officer and again to ED attending physician Dr. Archie Balboa. Of note patient did appear to have swelling under both of her eyes and she appeared jaundice/pale. Writer provided emotional support, patient calmed and requested a drink and some ice cream. Plan is for discharge home via EMS at patient request. Hospice team updated to discharge. Flo Shanks RN, [facility] [place], [facility] [phone] c

## 2015-07-02 NOTE — Discharge Instructions (Signed)
Please seek medical attention for any high fevers, chest pain, shortness of breath, change in behavior, persistent vomiting, bloody stool or any other new or concerning symptoms. ° ° °Nonspecific Chest Pain °It is often hard to find the cause of chest pain. There is always a chance that your pain could be related to something serious, such as a heart attack or a blood clot in your lungs. Chest pain can also be caused by conditions that are not life-threatening. If you have chest pain, it is very important to follow up with your doctor. ° °HOME CARE °· If you were prescribed an antibiotic medicine, finish it all even if you start to feel better. °· Avoid any activities that cause chest pain. °· Do not use any tobacco products, including cigarettes, chewing tobacco, or electronic cigarettes. If you need help quitting, ask your doctor. °· Do not drink alcohol. °· Take medicines only as told by your doctor. °· Keep all follow-up visits as told by your doctor. This is important. This includes any further testing if your chest pain does not go away. °· Your doctor may tell you to keep your head raised (elevated) while you sleep. °· Make lifestyle changes as told by your doctor. These may include: °¨ Getting regular exercise. Ask your doctor to suggest some activities that are safe for you. °¨ Eating a heart-healthy diet. Your doctor or a diet specialist (dietitian) can help you to learn healthy eating options. °¨ Maintaining a healthy weight. °¨ Managing diabetes, if necessary. °¨ Reducing stress. °GET HELP IF: °· Your chest pain does not go away, even after treatment. °· You have a rash with blisters on your chest. °· You have a fever. °GET HELP RIGHT AWAY IF: °· Your chest pain is worse. °· You have an increasing cough, or you cough up blood. °· You have severe belly (abdominal) pain. °· You feel extremely weak. °· You pass out (faint). °· You have chills. °· You have sudden, unexplained chest discomfort. °· You have  sudden, unexplained discomfort in your arms, back, neck, or jaw. °· You have shortness of breath at any time. °· You suddenly start to sweat, or your skin gets clammy. °· You feel nauseous. °· You vomit. °· You suddenly feel light-headed or dizzy. °· Your heart begins to beat quickly, or it feels like it is skipping beats. °These symptoms may be an emergency. Do not wait to see if the symptoms will go away. Get medical help right away. Call your local emergency services (911 in the [place] Do not drive yourself to the hospital. °  °This information is not intended to replace advice given to you by your health care provider. Make sure you discuss any questions you have with your health care provider. °  °Document Released: 10/12/2007 Document Revised: 05/16/2014 Document Reviewed: 11/29/2013 °Elsevier Interactive Patient Education ©2016 [employer] ° °

## 2015-07-02 NOTE — ED Notes (Signed)
Pt arrives from home via [facility]  Pt is to go to respite care this weekend at the [facility] and when the hospice RN came to see her the nurse notes bilateral peripheral edema present with hypoxia  sats recorded at 87% on RA by EMS  She has a history of right sided lung CA  - she does verbalize discomfort to her left chest  Pt to respite care so that her home may be treated for scabies and fleas

## 2015-07-10 ENCOUNTER — Telehealth: Payer: Self-pay | Admitting: *Deleted

## 2015-08-08 NOTE — Telephone Encounter (Signed)
Called to report that patient expired this morning at 6:45 AM

## 2015-08-08 DEATH — deceased

## 2015-08-24 ENCOUNTER — Ambulatory Visit: Payer: Medicare Other | Admitting: Family Medicine

## 2015-10-28 ENCOUNTER — Other Ambulatory Visit: Payer: Self-pay | Admitting: Nurse Practitioner

## 2016-01-18 IMAGING — CT CT HEAD WO/W CM
3 of 4 series · 17 of 30 positions shown, 19 images · IV contrast (omnipaque)
Comparison: Noncontrast head CT 11/26/2013

CLINICAL DATA: Metastatic breast cancer restaging. Memory loss and
headache.

EXAM:
CT HEAD WITHOUT AND WITH CONTRAST
TECHNIQUE: Contiguous axial images were obtained from the base of the skull
through the vertex without and with intravenous contrast
CONTRAST:  75mL OMNIPAQUE IOHEXOL 350 MG/ML SOLN

[Series 3: head wo · axial · 0.39mm/px · z∈[-207,-108]mm · 5 of 30 slices shown]
[im 5/30  brain]
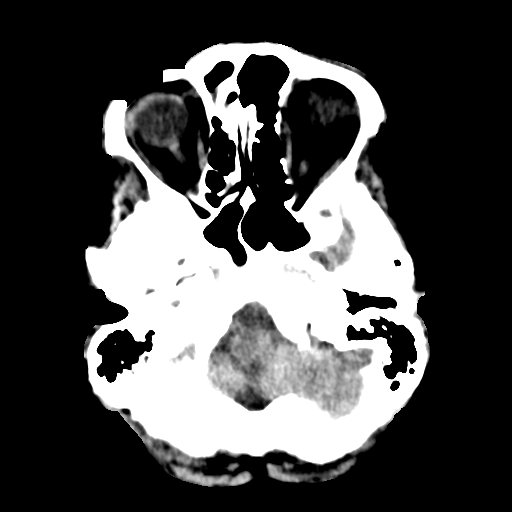
[im 10/30  brain]
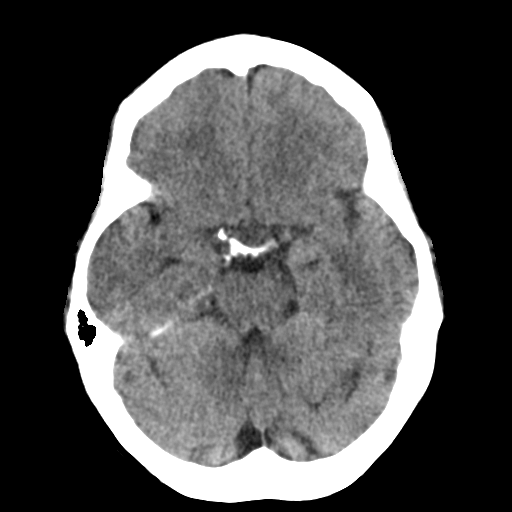
[im 15/30  brain]
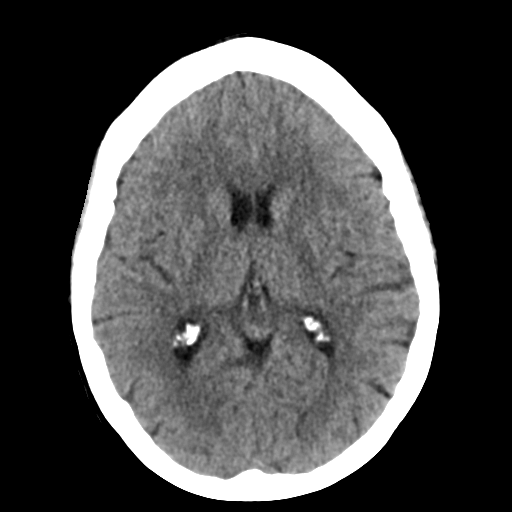
[im 20/30  brain]
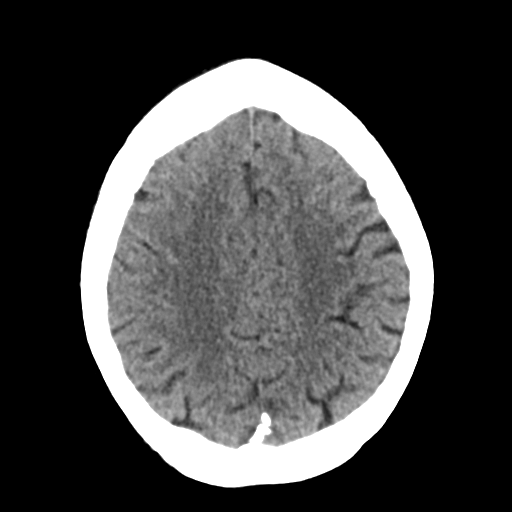
[im 25/30  brain]
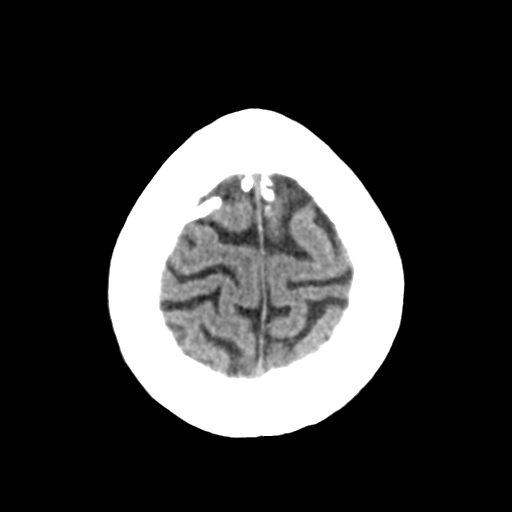

[Series 7: head with · axial · 0.40mm/px · z∈[-216,-116]mm · 6 of 30 slices shown, 8 images]
[im 5/30  brain]
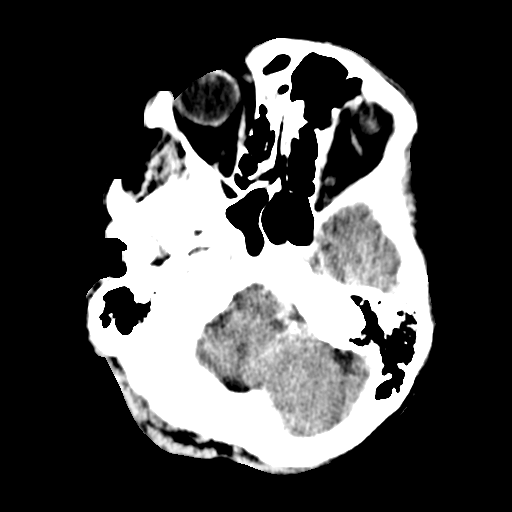
[im 5/30  bone]
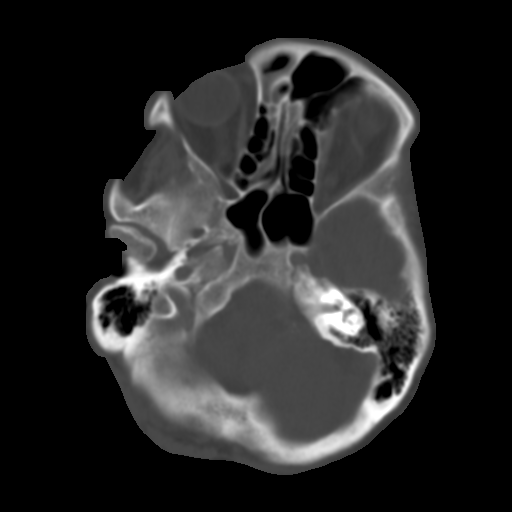
[im 9/30  brain]
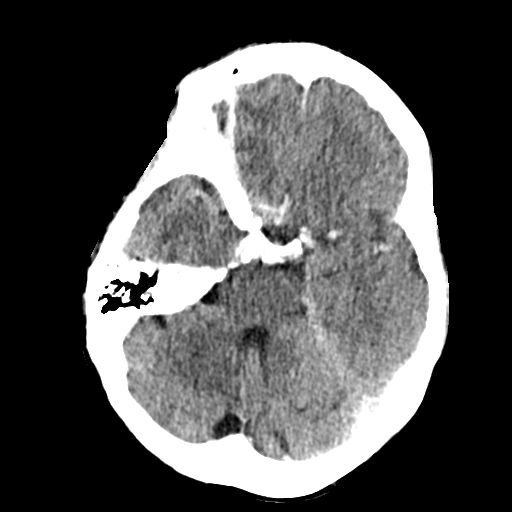
[im 13/30  brain]
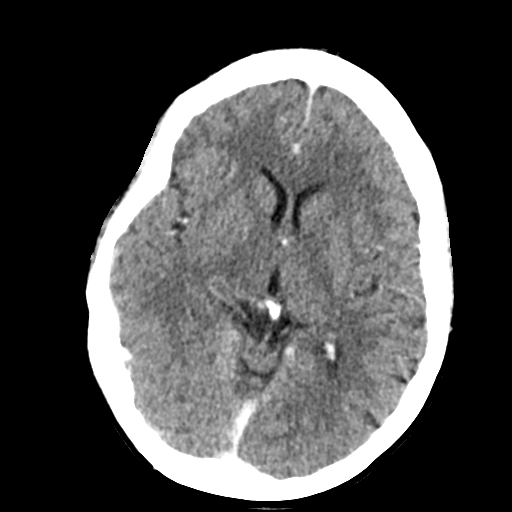
[im 17/30  brain]
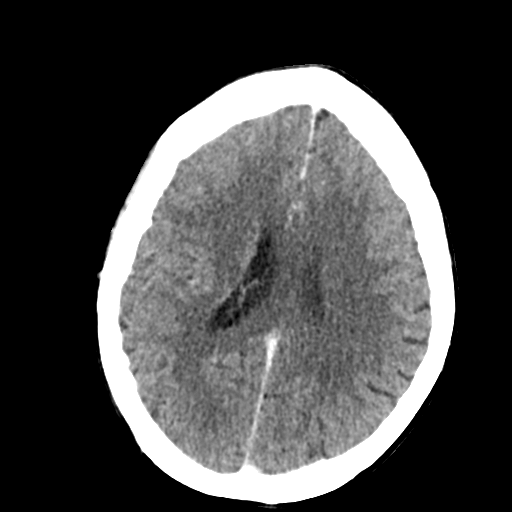
[im 21/30  brain]
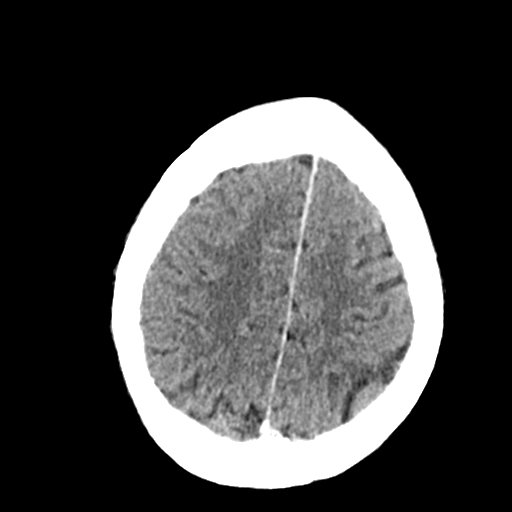
[im 21/30  bone]
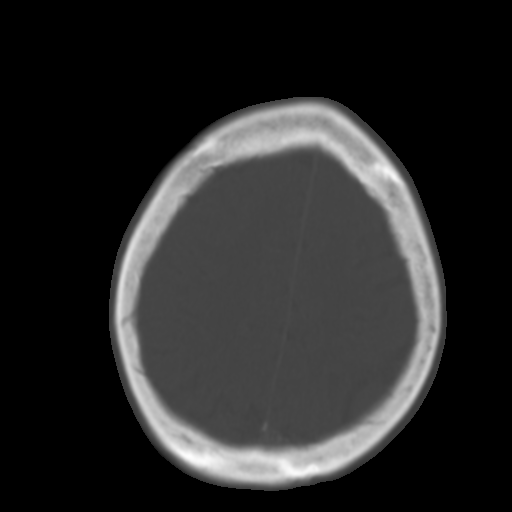
[im 25/30  brain]
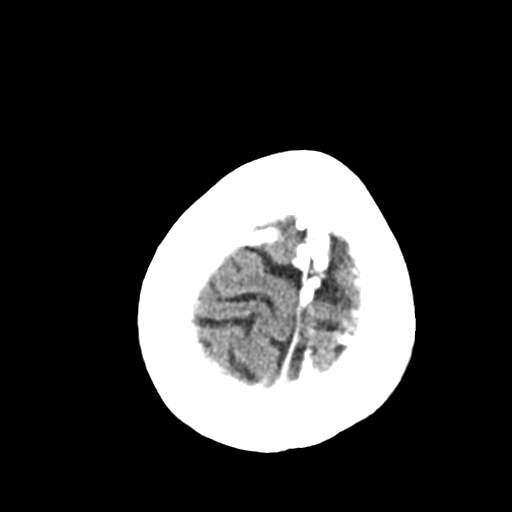

[Series 8: head with recon · axial · 0.40mm/px · z∈[-205,-107]mm · 6 of 30 slices shown]
[im 5/30  brain]
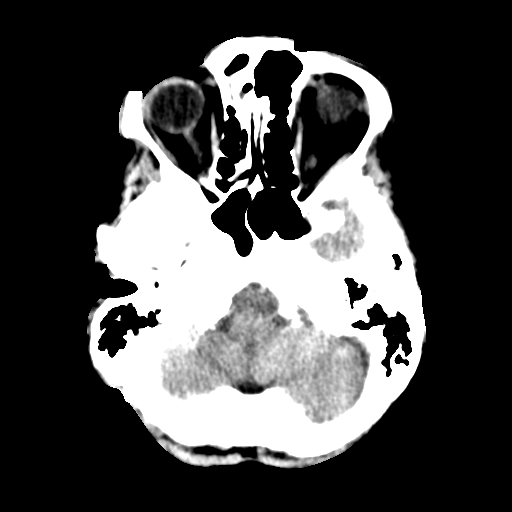
[im 9/30  brain]
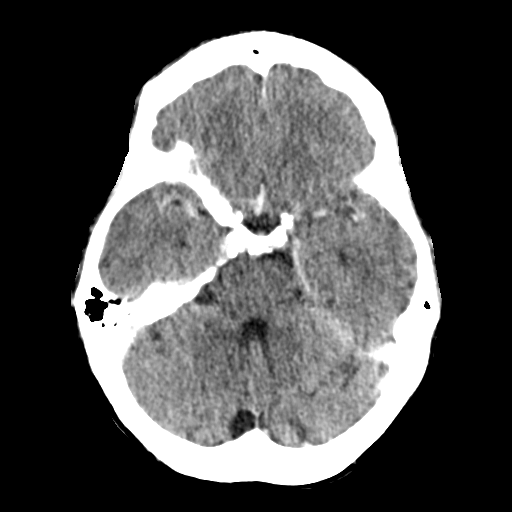
[im 13/30  brain]
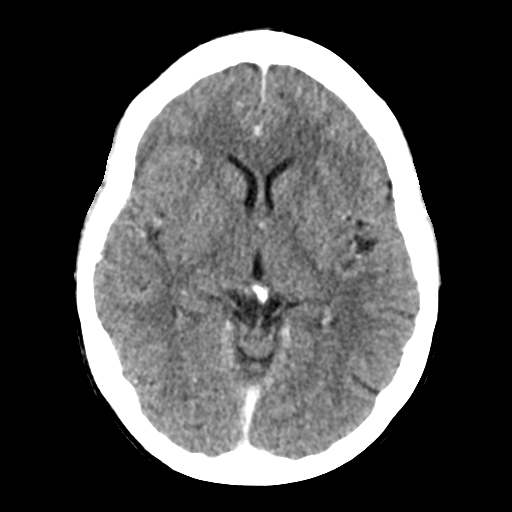
[im 17/30  brain]
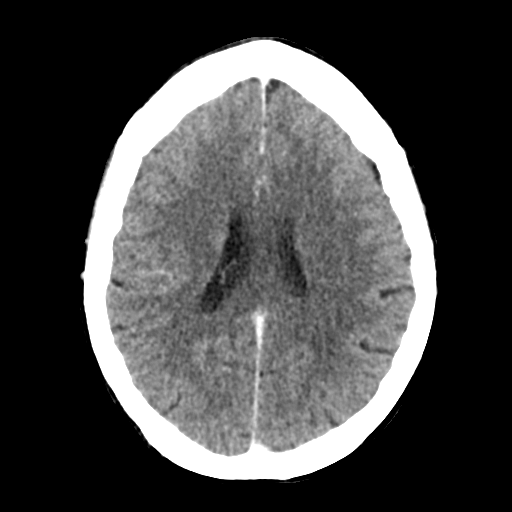
[im 21/30  brain]
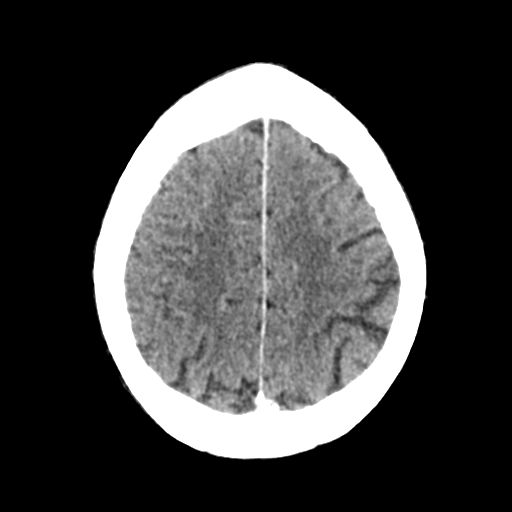
[im 25/30  brain]
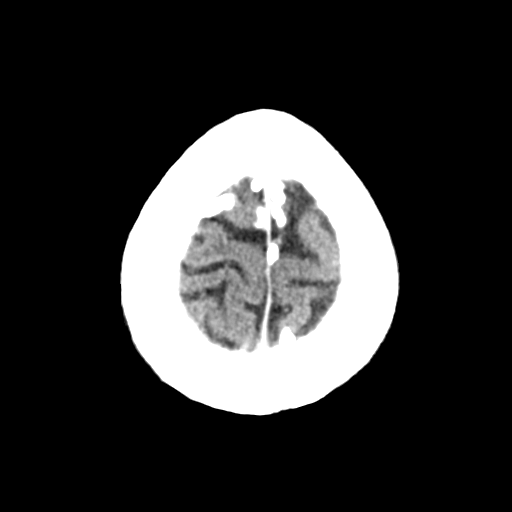

[17 of 30 positions shown; findings below may reference images not displayed]

FINDINGS: The ventricles and sulci are within normal limits for age. There is
no evidence of acute infarct, intracranial hemorrhage, mass, midline
shift, or extra-axial collection. No abnormal enhancement is
identified.

The orbits are unremarkable. There is mild right frontal sinus and
right ethmoid air cell mucosal thickening. The visualized mastoid
air cells are clear. No destructive osseous lesions are seen.
IMPRESSION: Unremarkable head CT.  No evidence of intracranial metastases.

## 2016-11-08 NOTE — Telephone Encounter (Signed)
done
